# Patient Record
Sex: Female | Born: 1980
Health system: Southern US, Community
[De-identification: ages and names within clinical notes are randomized; demographics above are authoritative.]

## PROBLEM LIST (undated history)

## (undated) ENCOUNTER — Inpatient Hospital Stay (HOSPITAL_COMMUNITY): Payer: Self-pay

## (undated) DIAGNOSIS — Z862 Personal history of diseases of the blood and blood-forming organs and certain disorders involving the immune mechanism: Secondary | ICD-10-CM

## (undated) DIAGNOSIS — L309 Dermatitis, unspecified: Secondary | ICD-10-CM

## (undated) DIAGNOSIS — E279 Disorder of adrenal gland, unspecified: Secondary | ICD-10-CM

## (undated) DIAGNOSIS — E269 Hyperaldosteronism, unspecified: Secondary | ICD-10-CM

## (undated) DIAGNOSIS — E876 Hypokalemia: Secondary | ICD-10-CM

## (undated) DIAGNOSIS — Z8619 Personal history of other infectious and parasitic diseases: Secondary | ICD-10-CM

## (undated) DIAGNOSIS — G43909 Migraine, unspecified, not intractable, without status migrainosus: Secondary | ICD-10-CM

## (undated) DIAGNOSIS — B9689 Other specified bacterial agents as the cause of diseases classified elsewhere: Secondary | ICD-10-CM

## (undated) DIAGNOSIS — N870 Mild cervical dysplasia: Secondary | ICD-10-CM

## (undated) DIAGNOSIS — D573 Sickle-cell trait: Secondary | ICD-10-CM

## (undated) DIAGNOSIS — M419 Scoliosis, unspecified: Secondary | ICD-10-CM

## (undated) DIAGNOSIS — O165 Unspecified maternal hypertension, complicating the puerperium: Secondary | ICD-10-CM

## (undated) DIAGNOSIS — B379 Candidiasis, unspecified: Secondary | ICD-10-CM

## (undated) DIAGNOSIS — B999 Unspecified infectious disease: Secondary | ICD-10-CM

## (undated) DIAGNOSIS — N760 Acute vaginitis: Secondary | ICD-10-CM

## (undated) DIAGNOSIS — I10 Essential (primary) hypertension: Secondary | ICD-10-CM

## (undated) DIAGNOSIS — O139 Gestational [pregnancy-induced] hypertension without significant proteinuria, unspecified trimester: Secondary | ICD-10-CM

## (undated) DIAGNOSIS — B373 Candidiasis of vulva and vagina: Secondary | ICD-10-CM

## (undated) DIAGNOSIS — D563 Thalassemia minor: Secondary | ICD-10-CM

## (undated) DIAGNOSIS — Z87440 Personal history of urinary (tract) infections: Secondary | ICD-10-CM

## (undated) DIAGNOSIS — E2609 Other primary hyperaldosteronism: Secondary | ICD-10-CM

## (undated) DIAGNOSIS — IMO0002 Reserved for concepts with insufficient information to code with codable children: Secondary | ICD-10-CM

## (undated) HISTORY — DX: Other specified bacterial agents as the cause of diseases classified elsewhere: B96.89

## (undated) HISTORY — DX: Personal history of other infectious and parasitic diseases: Z86.19

## (undated) HISTORY — DX: Personal history of urinary (tract) infections: Z87.440

## (undated) HISTORY — DX: Unspecified infectious disease: B99.9

## (undated) HISTORY — DX: Hyperaldosteronism, unspecified: E26.9

## (undated) HISTORY — DX: Reserved for concepts with insufficient information to code with codable children: IMO0002

## (undated) HISTORY — DX: Candidiasis, unspecified: B37.9

## (undated) HISTORY — DX: Gestational (pregnancy-induced) hypertension without significant proteinuria, unspecified trimester: O13.9

## (undated) HISTORY — DX: Unspecified maternal hypertension, complicating the puerperium: O16.5

## (undated) HISTORY — DX: Dermatitis, unspecified: L30.9

## (undated) HISTORY — PX: WISDOM TOOTH EXTRACTION: SHX21

## (undated) HISTORY — PX: TUBAL LIGATION: SHX77

## (undated) HISTORY — DX: Candidiasis of vulva and vagina: B37.3

## (undated) HISTORY — DX: Mild cervical dysplasia: N87.0

## (undated) HISTORY — DX: Migraine, unspecified, not intractable, without status migrainosus: G43.909

## (undated) HISTORY — DX: Sickle-cell trait: D57.3

## (undated) HISTORY — DX: Acute vaginitis: N76.0

## (undated) HISTORY — DX: Personal history of diseases of the blood and blood-forming organs and certain disorders involving the immune mechanism: Z86.2

---
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
## 2000-07-23 ENCOUNTER — Inpatient Hospital Stay (HOSPITAL_COMMUNITY): Admission: AD | Admit: 2000-07-23 | Discharge: 2000-07-23 | Payer: Self-pay | Admitting: *Deleted

## 2000-09-03 DIAGNOSIS — E279 Disorder of adrenal gland, unspecified: Secondary | ICD-10-CM

## 2000-09-03 HISTORY — DX: Disorder of adrenal gland, unspecified: E27.9

## 2001-08-30 ENCOUNTER — Encounter: Payer: Self-pay | Admitting: *Deleted

## 2001-08-30 ENCOUNTER — Inpatient Hospital Stay (HOSPITAL_COMMUNITY): Admission: AD | Admit: 2001-08-30 | Discharge: 2001-08-30 | Payer: Self-pay | Admitting: *Deleted

## 2001-09-01 ENCOUNTER — Inpatient Hospital Stay (HOSPITAL_COMMUNITY): Admission: AD | Admit: 2001-09-01 | Discharge: 2001-09-01 | Payer: Self-pay | Admitting: Obstetrics

## 2001-09-01 ENCOUNTER — Encounter: Payer: Self-pay | Admitting: Obstetrics and Gynecology

## 2001-09-01 ENCOUNTER — Encounter (INDEPENDENT_AMBULATORY_CARE_PROVIDER_SITE_OTHER): Payer: Self-pay

## 2001-09-03 HISTORY — PX: DILATION AND CURETTAGE OF UTERUS: SHX78

## 2002-08-02 ENCOUNTER — Emergency Department (HOSPITAL_COMMUNITY): Admission: EM | Admit: 2002-08-02 | Discharge: 2002-08-02 | Payer: Self-pay | Admitting: Emergency Medicine

## 2002-09-26 ENCOUNTER — Encounter: Payer: Self-pay | Admitting: Orthopedic Surgery

## 2002-09-26 ENCOUNTER — Ambulatory Visit (HOSPITAL_COMMUNITY): Admission: RE | Admit: 2002-09-26 | Discharge: 2002-09-26 | Payer: Self-pay | Admitting: Family Medicine

## 2003-08-03 ENCOUNTER — Observation Stay (HOSPITAL_COMMUNITY): Admission: AD | Admit: 2003-08-03 | Discharge: 2003-08-04 | Payer: Self-pay | Admitting: *Deleted

## 2003-08-19 ENCOUNTER — Other Ambulatory Visit: Admission: RE | Admit: 2003-08-19 | Discharge: 2003-08-19 | Payer: Self-pay | Admitting: Obstetrics and Gynecology

## 2003-10-14 ENCOUNTER — Ambulatory Visit (HOSPITAL_COMMUNITY): Admission: RE | Admit: 2003-10-14 | Discharge: 2003-10-14 | Payer: Self-pay | Admitting: Obstetrics and Gynecology

## 2003-11-24 ENCOUNTER — Ambulatory Visit (HOSPITAL_COMMUNITY): Admission: RE | Admit: 2003-11-24 | Discharge: 2003-11-24 | Payer: Self-pay | Admitting: Obstetrics and Gynecology

## 2003-12-07 ENCOUNTER — Ambulatory Visit (HOSPITAL_COMMUNITY): Admission: RE | Admit: 2003-12-07 | Discharge: 2003-12-07 | Payer: Self-pay | Admitting: Obstetrics and Gynecology

## 2003-12-23 ENCOUNTER — Inpatient Hospital Stay (HOSPITAL_COMMUNITY): Admission: AD | Admit: 2003-12-23 | Discharge: 2003-12-23 | Payer: Self-pay | Admitting: Obstetrics and Gynecology

## 2004-01-20 ENCOUNTER — Ambulatory Visit (HOSPITAL_COMMUNITY): Admission: RE | Admit: 2004-01-20 | Discharge: 2004-01-20 | Payer: Self-pay | Admitting: Obstetrics and Gynecology

## 2004-02-15 ENCOUNTER — Ambulatory Visit (HOSPITAL_COMMUNITY): Admission: RE | Admit: 2004-02-15 | Discharge: 2004-02-15 | Payer: Self-pay | Admitting: Obstetrics and Gynecology

## 2004-02-23 ENCOUNTER — Ambulatory Visit (HOSPITAL_COMMUNITY): Admission: RE | Admit: 2004-02-23 | Discharge: 2004-02-23 | Payer: Self-pay | Admitting: Obstetrics and Gynecology

## 2004-03-01 ENCOUNTER — Encounter (INDEPENDENT_AMBULATORY_CARE_PROVIDER_SITE_OTHER): Payer: Self-pay | Admitting: Specialist

## 2004-03-01 ENCOUNTER — Inpatient Hospital Stay (HOSPITAL_COMMUNITY): Admission: RE | Admit: 2004-03-01 | Discharge: 2004-03-04 | Payer: Self-pay | Admitting: Obstetrics and Gynecology

## 2004-04-20 ENCOUNTER — Emergency Department (HOSPITAL_COMMUNITY): Admission: EM | Admit: 2004-04-20 | Discharge: 2004-04-20 | Payer: Self-pay | Admitting: Emergency Medicine

## 2004-07-14 ENCOUNTER — Other Ambulatory Visit: Admission: RE | Admit: 2004-07-14 | Discharge: 2004-07-14 | Payer: Self-pay | Admitting: Obstetrics and Gynecology

## 2004-08-07 ENCOUNTER — Ambulatory Visit (HOSPITAL_COMMUNITY): Admission: RE | Admit: 2004-08-07 | Discharge: 2004-08-07 | Payer: Self-pay | Admitting: Specialist

## 2004-08-07 ENCOUNTER — Encounter (INDEPENDENT_AMBULATORY_CARE_PROVIDER_SITE_OTHER): Payer: Self-pay | Admitting: Specialist

## 2004-08-07 ENCOUNTER — Ambulatory Visit (HOSPITAL_BASED_OUTPATIENT_CLINIC_OR_DEPARTMENT_OTHER): Admission: RE | Admit: 2004-08-07 | Discharge: 2004-08-07 | Payer: Self-pay | Admitting: Specialist

## 2004-09-03 HISTORY — PX: BREAST REDUCTION SURGERY: SHX8

## 2005-08-14 ENCOUNTER — Other Ambulatory Visit: Admission: RE | Admit: 2005-08-14 | Discharge: 2005-08-14 | Payer: Self-pay | Admitting: Obstetrics and Gynecology

## 2005-08-15 ENCOUNTER — Other Ambulatory Visit: Admission: RE | Admit: 2005-08-15 | Discharge: 2005-08-15 | Payer: Self-pay | Admitting: Obstetrics and Gynecology

## 2005-09-03 DIAGNOSIS — E876 Hypokalemia: Secondary | ICD-10-CM

## 2005-09-03 DIAGNOSIS — L309 Dermatitis, unspecified: Secondary | ICD-10-CM

## 2005-09-03 HISTORY — DX: Dermatitis, unspecified: L30.9

## 2005-09-03 HISTORY — DX: Hypokalemia: E87.6

## 2005-10-13 ENCOUNTER — Emergency Department (HOSPITAL_COMMUNITY): Admission: EM | Admit: 2005-10-13 | Discharge: 2005-10-13 | Payer: Self-pay | Admitting: Family Medicine

## 2005-12-01 IMAGING — US US OB FOLLOW-UP
1 series · 13 of 28 positions shown · non-contrast
Comparison: none

CLINICAL DATA: G1 P0.  24 weeks 2 days with twins.  Assess growth.

[Series 1: unknown · 0.33mm/px · 13 of 50 slices shown]
[im 2/50]
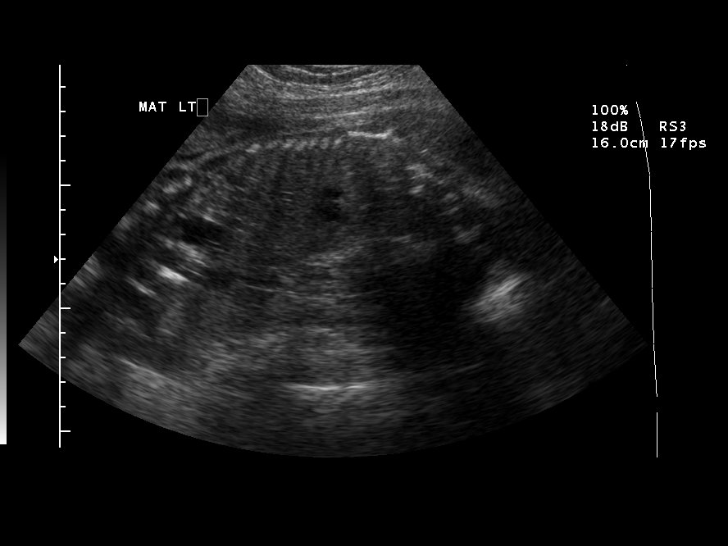
[im 6/50]
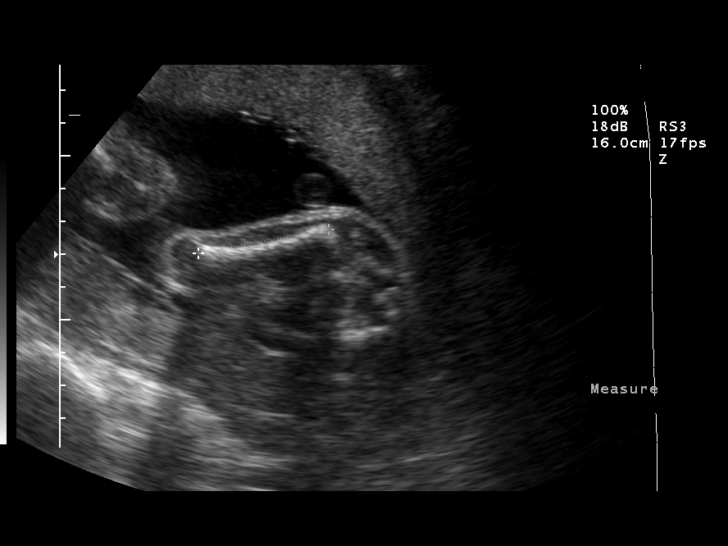
[im 10/50]
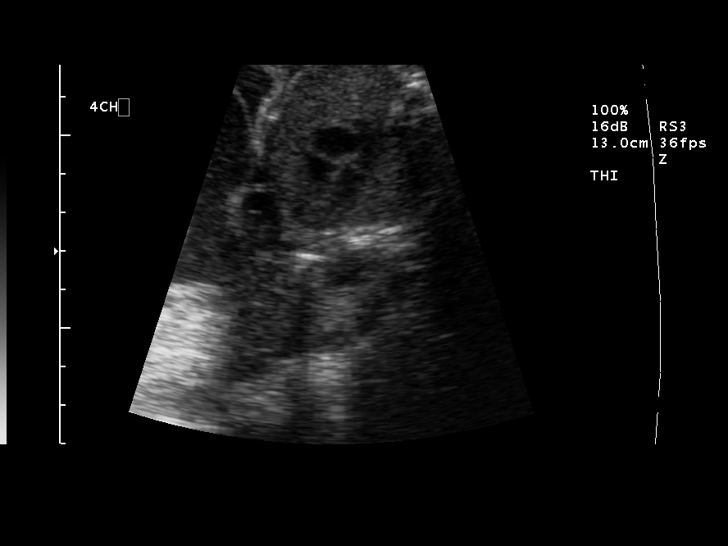
[im 13/50]
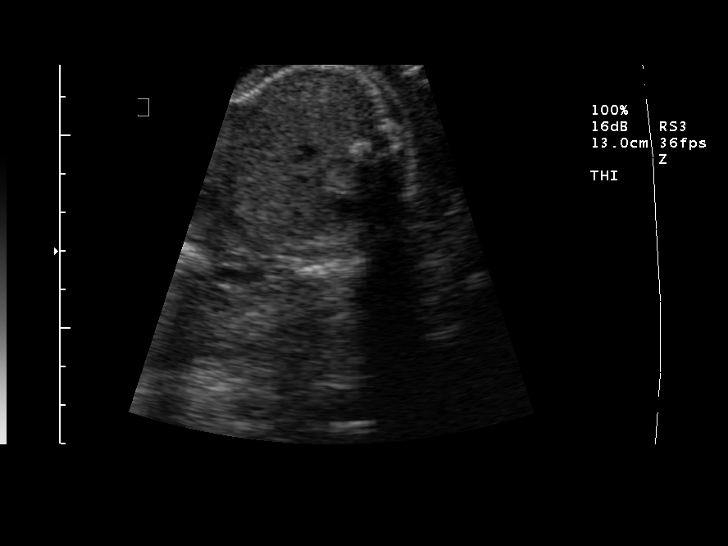
[im 17/50]
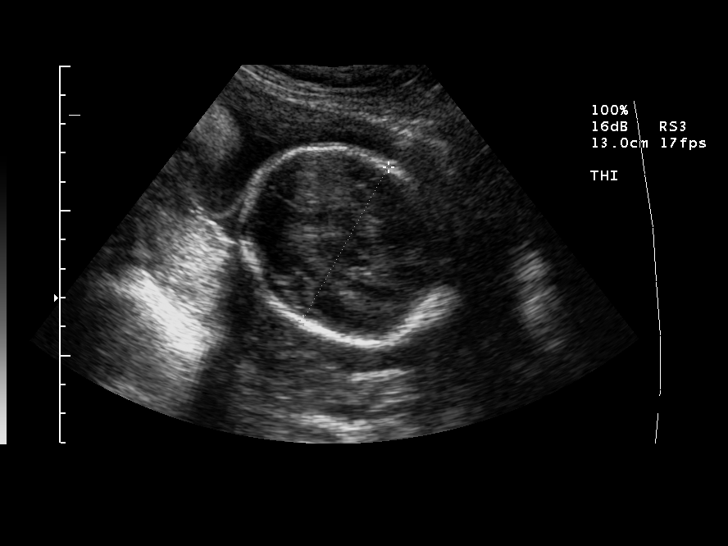
[im 20/50]
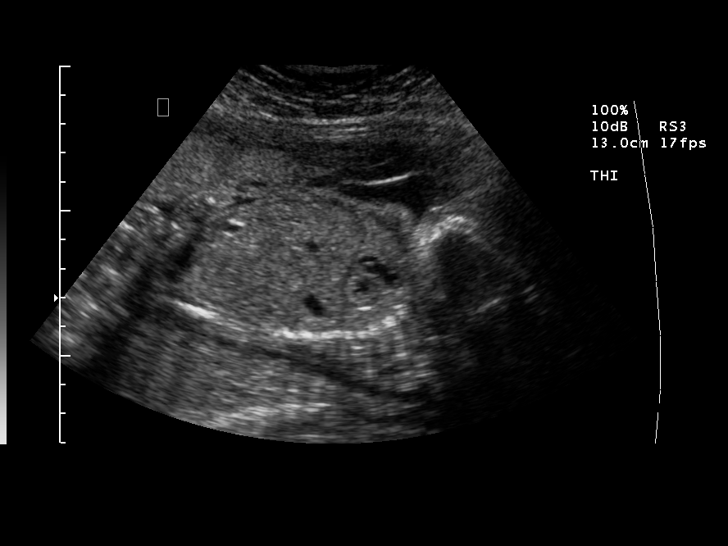
[im 26/50]
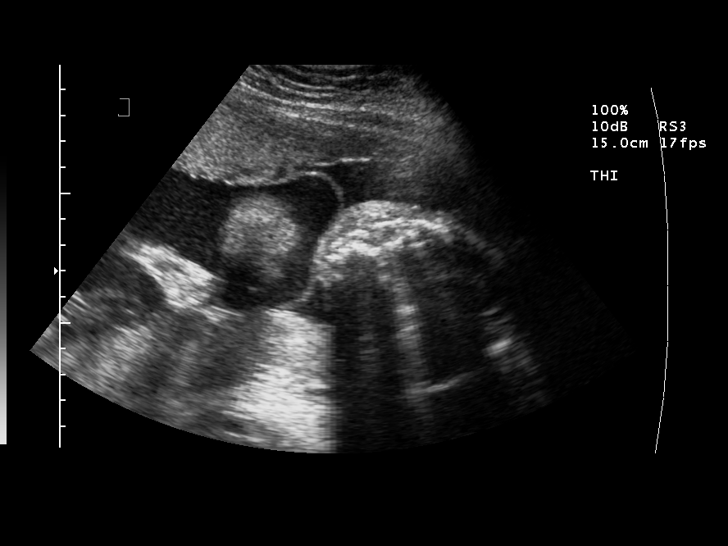
[im 30/50]
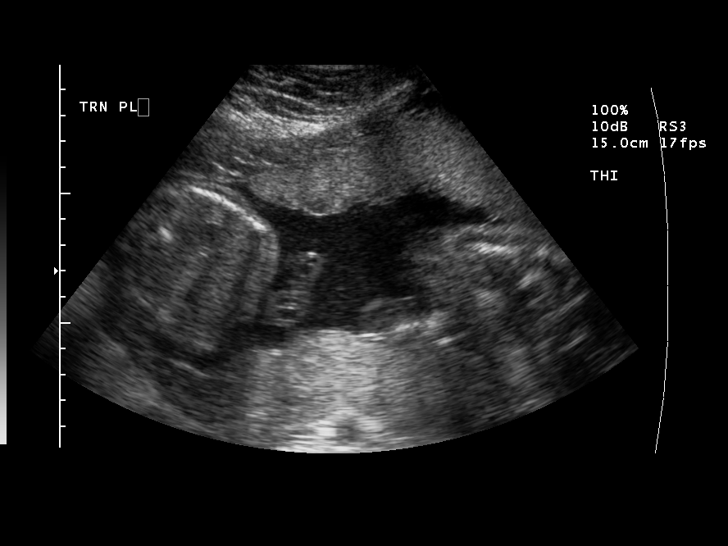
[im 33/50]
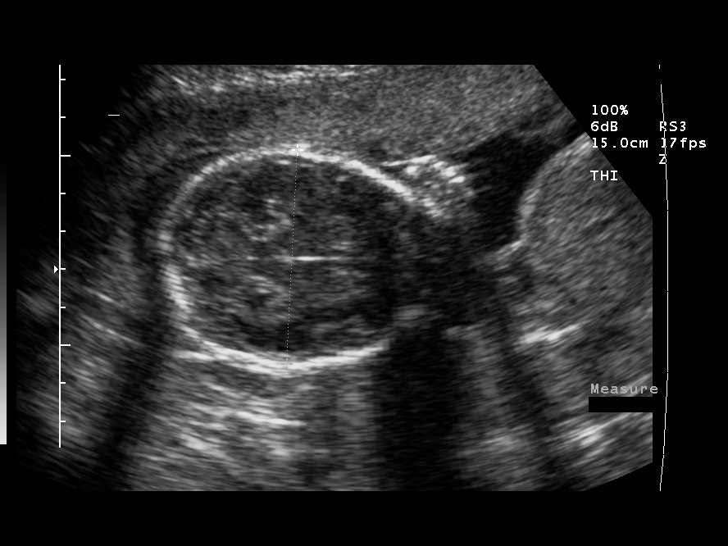
[im 37/50]
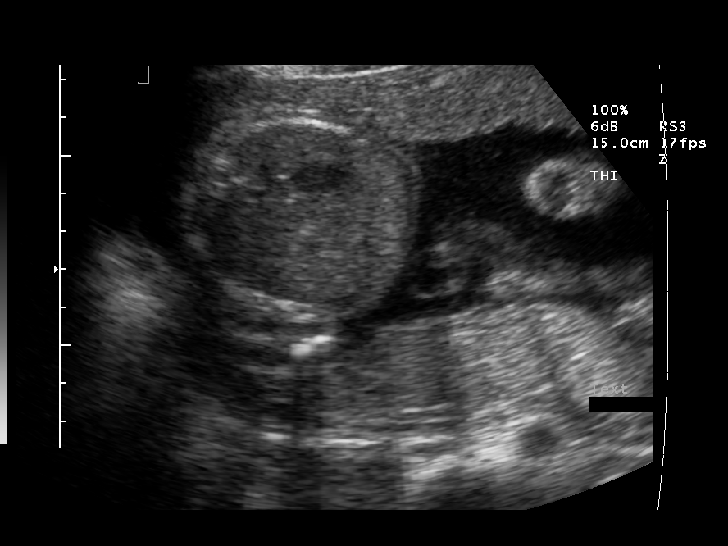
[im 40/50]
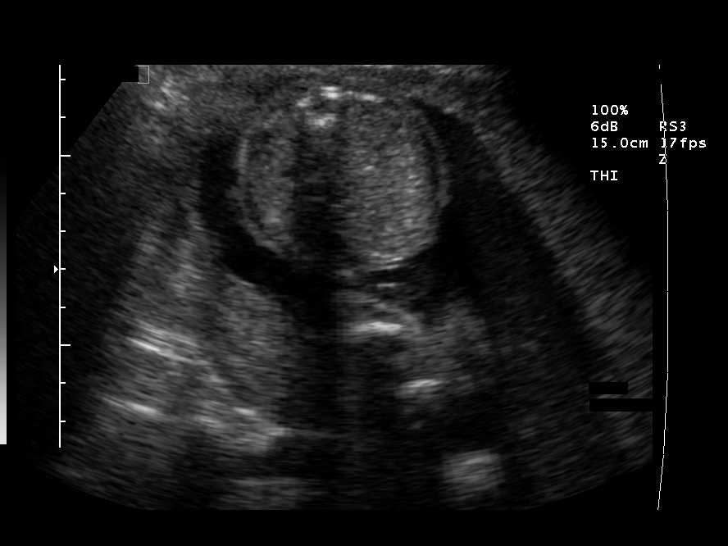
[im 44/50]
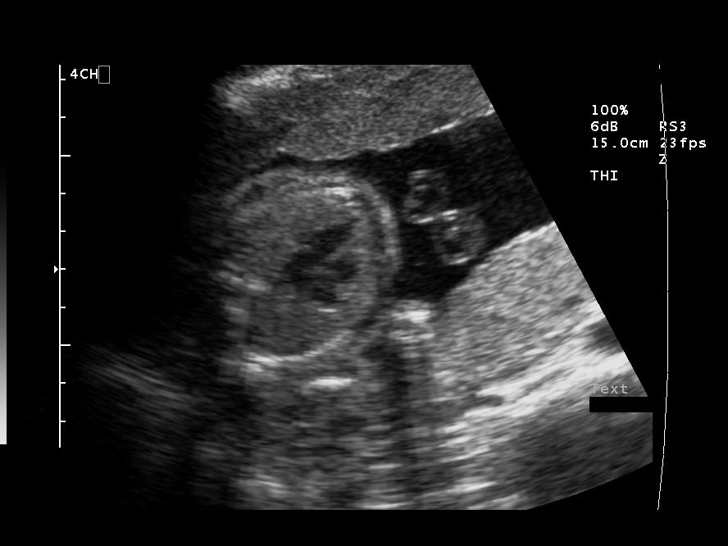
[im 48/50]
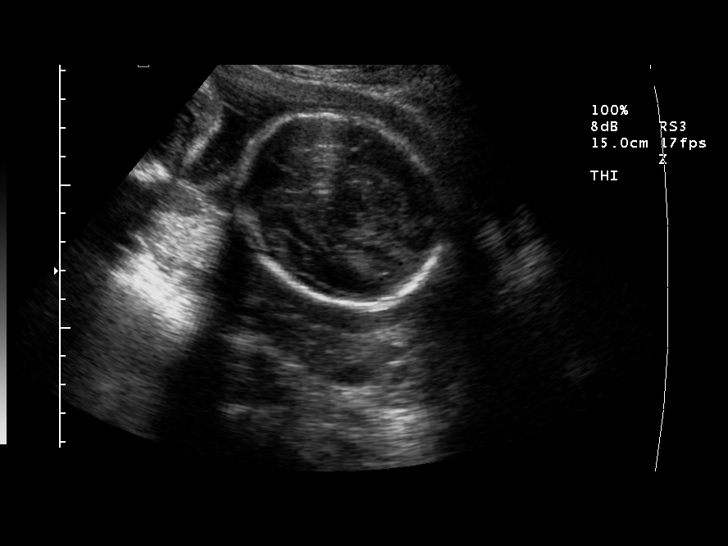

[13 of 28 positions shown; findings below may reference images not displayed]

TWIN OBSTETRICAL ULTRASOUND REEVALUATION:
A living dichorionic/diamniotic twin pregnancy.  A separating membrane is seen.

TWIN A:
Heart Rate:  142
Movement:  Yes
Breathing:  No
Presentation:  Cephalic 
Placental Location:  Posterior
Grade:  I
Previa:  No
Amniotic Fluid (Subjective):  Low normal
Amniotic Fluid (Objective):   2.6 cm Vertical pocket 

FETAL BIOMETRY (TWIN A)
BPD:  6.1 cm   24 w 5 d
HC:  22.3 cm   24 w 2 d
AC:  18.1 cm   23 w 0 d
FL:  4.1 cm   23 w 2 d

Mean GA:  23 w 6 d
LMP GA:                     24 w 2 d (assigned)

FETAL ANATOMY (TWIN A)
Lateral Ventricles:  Visualized 
Thalami/CSP:  Previously seen 
Posterior Fossa:  Previously seen 
Nuchal Region:  Previously seen 
Spine:  Previously seen 
4 Chamber Heart on L:  Visualized 
Stomach on Left:  Visualized 
3 Vessel Cord:  Previously seen 
Cord Insertion Site:  Previously seen 
Kidneys:  Visualized 
Bladder:  Previously seen 
Extremities:  Previously seen 

TWIN B:
Heart Rate:  153
Movement:  Yes
Breathing:  No
Presentation:  Cephalic
Placental Location:  Anterior
Grade:  I
Previa:  No
Amniotic Fluid (Subjective):  Normal  
Amniotic Fluid (Objective):   5.8 cm Vertical pocket 

FETAL BIOMETRY (TWIN B)
BPD:  5.6 cm  23 w 1 d
HC:  20.9 cm   23 w 0 d
AC:  19.0 cm   23 w 5 d
FL:  4.1 cm  23 w 2 d

Mean GA:  23 w 2 d
LMP GA:                       24 w 2 d (assigned)

FETAL ANATOMY (TWIN B)
Lateral Ventricles:  Visualized 
Thalami/CSP:  Previously seen 
Posterior Fossa:  Previously seen 
Nuchal Region:  Previously seen  
Spine:  Previously seen 
4 Chamber Heart on L:  Visualized 
Stomach on Left:  Visualized 
3 Vessel Cord:  Previously seen 
Cord Insertion Site:  Previously seen 
Kidneys:  Visualized 
Bladder:  Visualized 
Extremities:  Previously seen 

MATERNAL FINDINGS
Cervix:  4.1 cm Translabially
IMPRESSION: Living dichorionic/diamniotic twin pregnancy of 24 weeks 2 days gestational age.  Twin A measures 23 weeks 6 days and twin B measures 23 weeks 2 days.  Growth is currently appropriate and concordant.  
There is normal fluid in each gestational sac, but less fluid in the sac surrounding twin A than the sac surrounding twin B.

## 2005-12-22 ENCOUNTER — Inpatient Hospital Stay (HOSPITAL_COMMUNITY): Admission: AD | Admit: 2005-12-22 | Discharge: 2005-12-22 | Payer: Self-pay | Admitting: Obstetrics and Gynecology

## 2005-12-28 ENCOUNTER — Encounter (INDEPENDENT_AMBULATORY_CARE_PROVIDER_SITE_OTHER): Payer: Self-pay | Admitting: Specialist

## 2005-12-28 ENCOUNTER — Ambulatory Visit (HOSPITAL_COMMUNITY): Admission: RE | Admit: 2005-12-28 | Discharge: 2005-12-28 | Payer: Self-pay | Admitting: Obstetrics and Gynecology

## 2005-12-30 IMAGING — US US OB FOLLOW-UP EACH ADDL GEST (MODIFY)
1 series · 13 of 28 positions shown · non-contrast
Comparison: none

CLINICAL DATA: Twin pregnancy; assigned gestational age is 28 weeks 3 days;  question leaking fluid.  
TWIN OBSTETRICAL ULTRASOUND REEVALUATION:
A living twin gestation is present.  A separating membrane is seen.

[Series 1: unknown · 0.32mm/px · 13 of 64 slices shown]
[im 3/64]
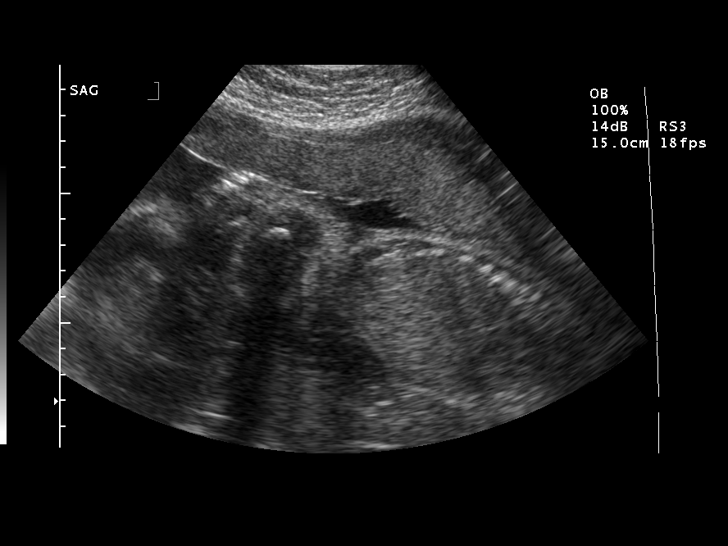
[im 8/64]
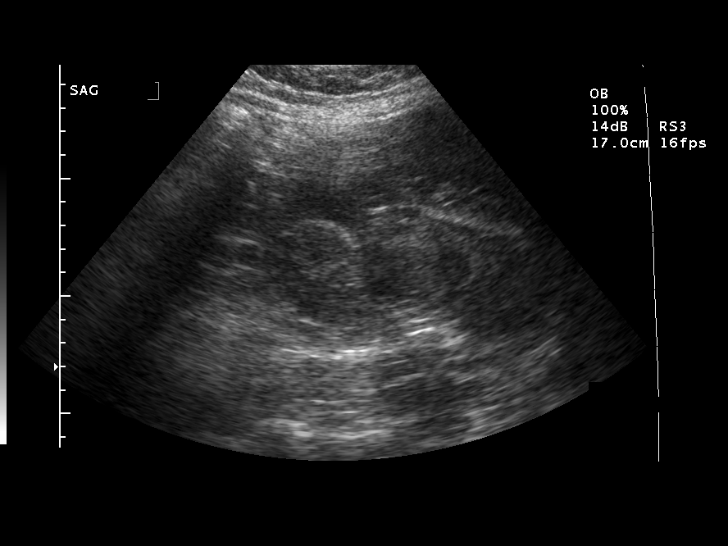
[im 12/64]
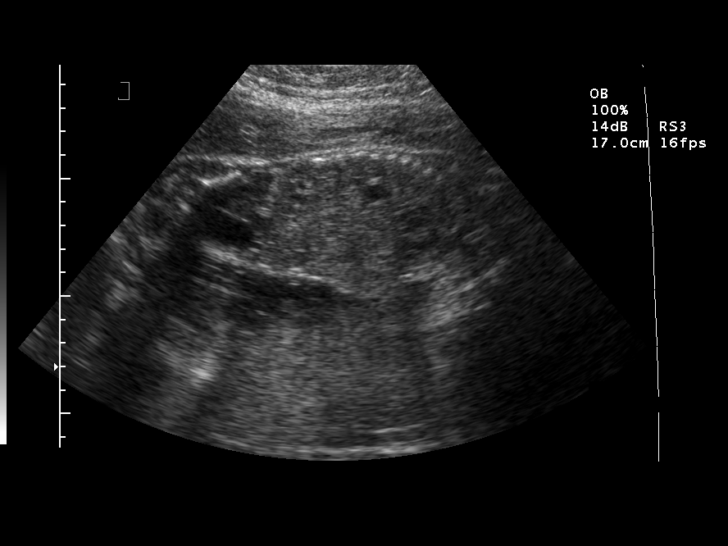
[im 17/64]
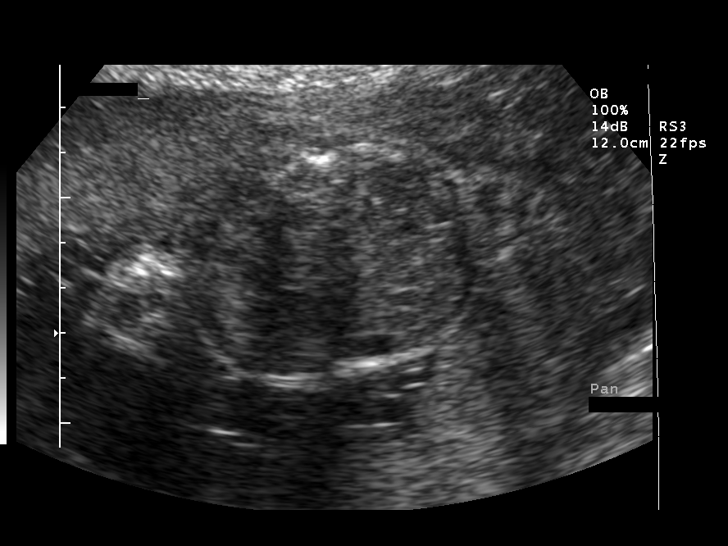
[im 22/64]
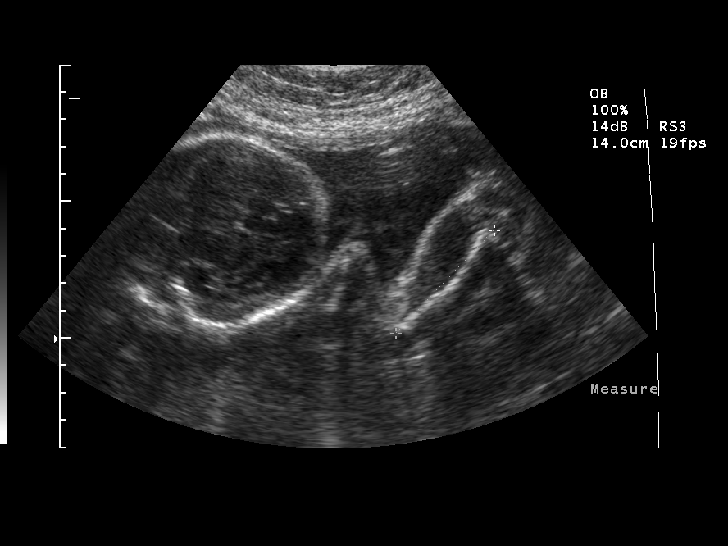
[im 26/64]
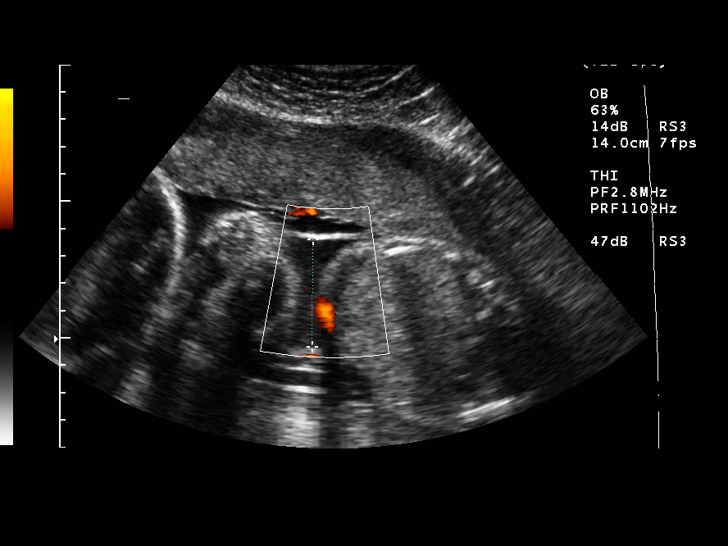
[im 33/64]
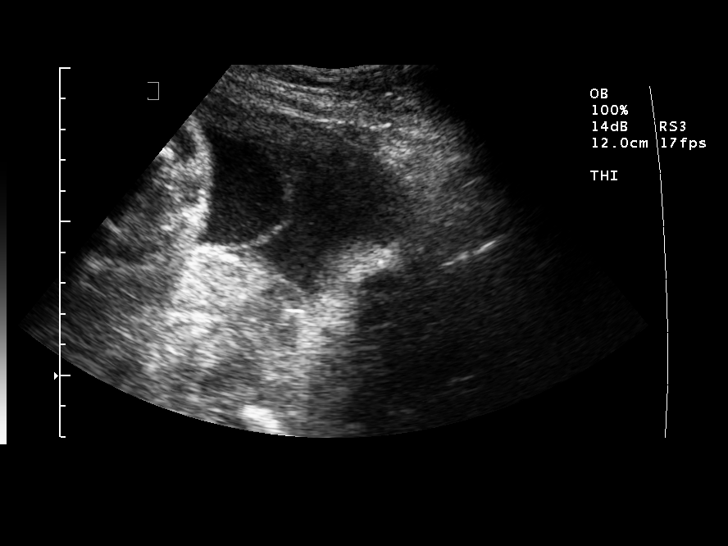
[im 38/64]
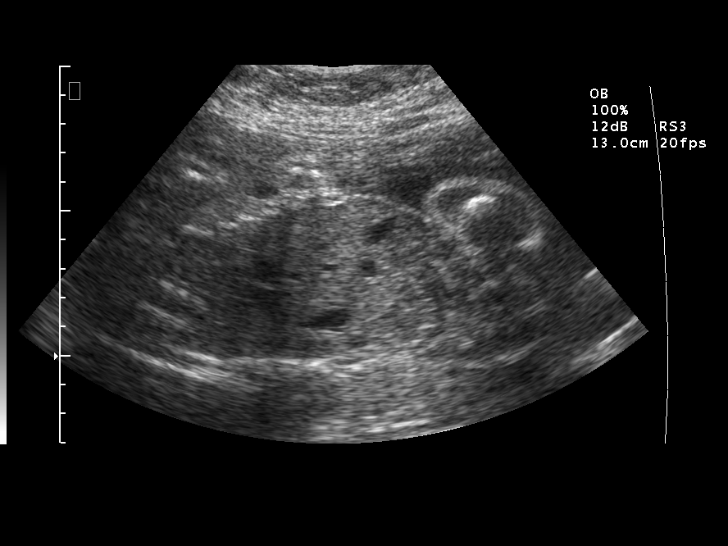
[im 43/64]
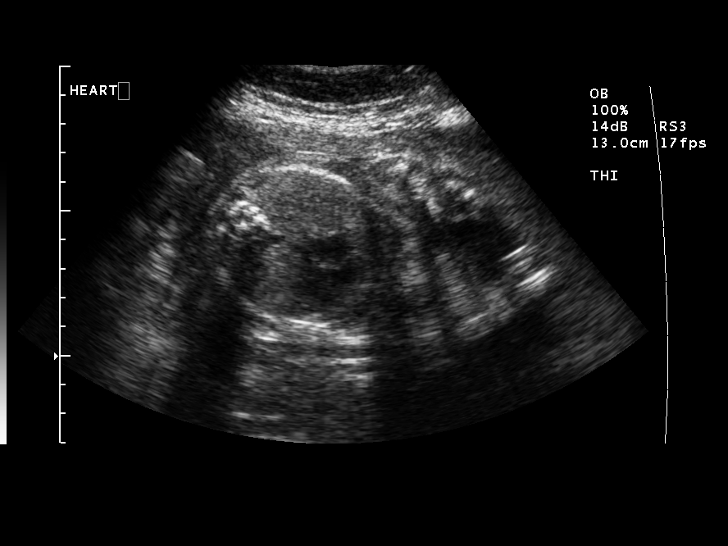
[im 47/64]
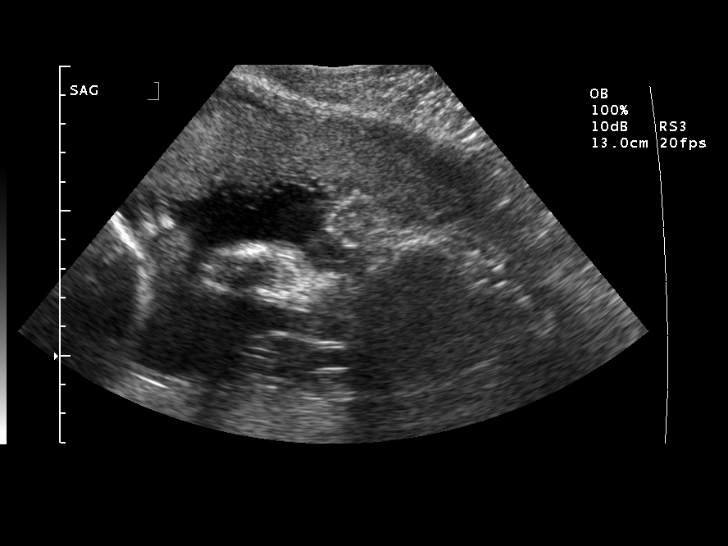
[im 52/64]
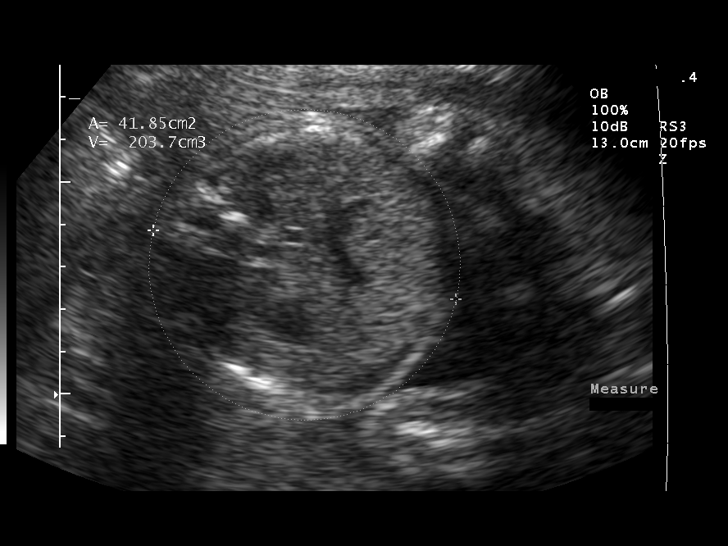
[im 57/64]
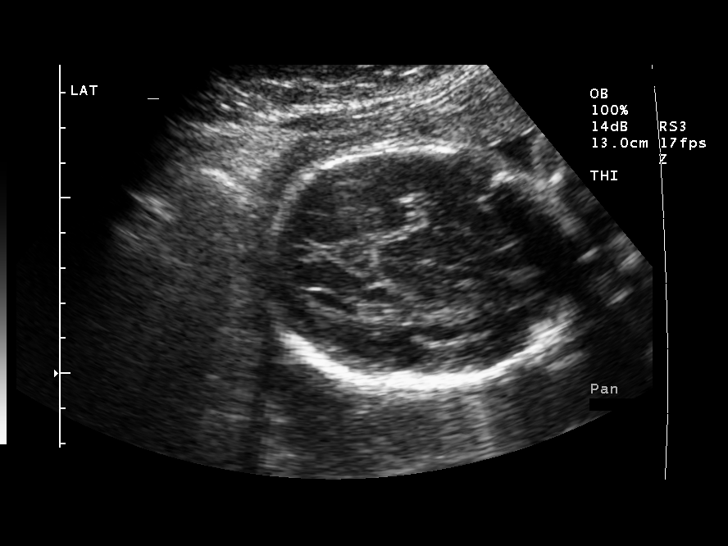
[im 61/64]
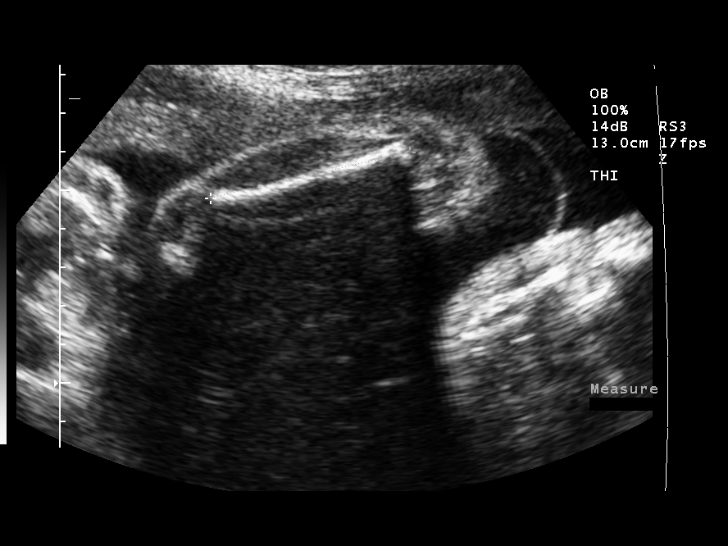

[13 of 28 positions shown; findings below may reference images not displayed]

TWIN A:
Heart Rate:  147
Movement:  Yes
Breathing:  No
Presentation:  Cephalic on the maternal left
Placental Location:  Posterior
Grade:  I
Previa:  No
Amniotic Fluid (Subjective):  Normal
Amniotic Fluid (Objective):   3.9 cm Vertical pocket 

FETAL BIOMETRY (TWIN A)
BPD:  7.2 cm   28 w 6 d
HC:  26.3 cm   28 w 4 d
AC:  22.2 cm   26 w 4 d
FL:  5.2 cm   27 w 5 d

Mean GA:  28 w 0 d

EFW:  7057 g (H) 50th ? 75th%ile (3449 ? 2285 g) For 28 wks

FETAL ANATOMY (TWIN A)
Lateral Ventricles:  Visualized 
Thalami/CSP:  Visualized 
Posterior Fossa:  Previously seen 
Nuchal Region:  Previously seen 
Spine:  Previously seen 
4 Chamber Heart on L:  Previously seen 
Stomach on Left:  Visualized 
3 Vessel Cord:  Previously seen 
Cord Insertion Site:  Previously seen 
Kidneys:  Visualized 
Bladder:  Visualized 
Extremities:  Previously seen 

Evaluation limited by:  Advanced gestational age 

TWIN B:
Heart Rate:  155
Movement:  Yes
Breathing:  No
Presentation:  Breech on the maternal right
Placental Location:  Anterior
Grade:  I
Previa:  No
Amniotic Fluid (Subjective):  Normal
Amniotic Fluid (Objective):   5.6 cm Vertical pocket 

FETAL BIOMETRY (TWIN B)
BPD:  6.8 cm   27 w 4 d
HC:  25.3 cm   27 w 4 d
AC:  23.0 cm   27 w 3 d
FL:  5.3 cm   28 w 1 d

Mean GA:  27 w 5 d

EFW:  1128 g (H) 50th ? 75th%ile (3449 ? 2285 g) For 28 wks

FETAL ANATOMY (TWIN B)
Lateral Ventricles:  Visualized 
Thalami/CSP:  Visualized 
Posterior Fossa:  Previously seen 
Nuchal Region:  N/A
Spine:  Previously seen 
4 Chamber Heart on L:  Previously seen 
Stomach on Left:  Visualized 
3 Vessel Cord:  Previously seen 
Cord Insertion Site:  Previously seen 
Kidneys:  Visualized 
Bladder:  Visualized 
Extremities:  Previously seen 

Evaluation limited By:  Advanced gestational age 

MATERNAL FINDINGS
Cervix:  4.0 cm Translabially
IMPRESSION: There is a living twin gestation.  Both baby A and baby B remain at the 50th ? 75th percentile for a 28 week gestation.  The fluid within baby A's sac is lower than within baby B's sac; however, both are subjectively within normal limits.  
The cervix remains long and closed measuring 4.0 cm, translabially

## 2006-01-27 IMAGING — US US OB FOLLOW-UP EACH ADDL GEST (MODIFY)
1 series · 12 of 28 positions shown · non-contrast
Comparison: none

CLINICAL DATA: Size greater than dates.

[Series 1: unknown · 0.30mm/px · 12 of 55 slices shown]
[im 3/55]
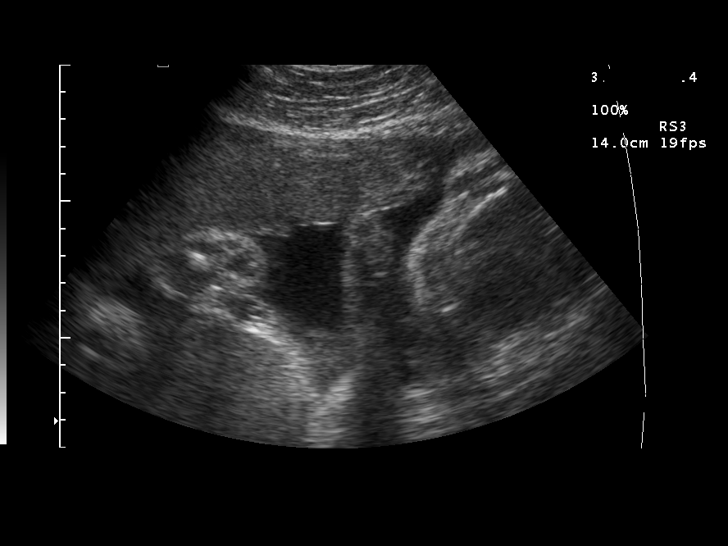
[im 7/55]
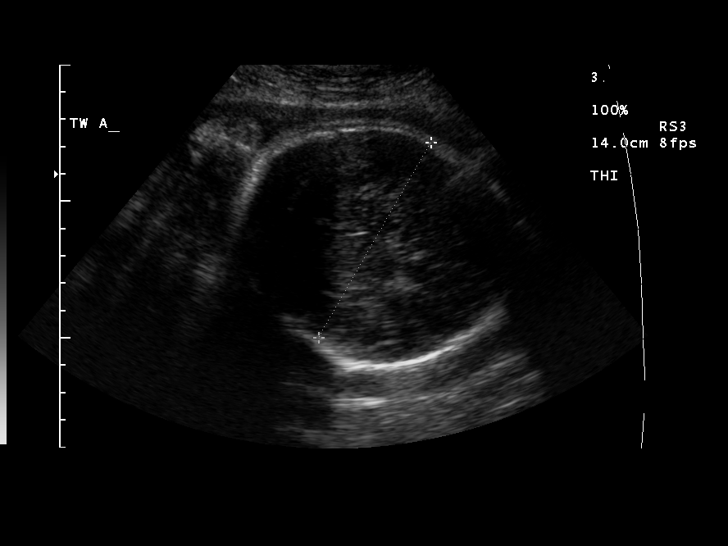
[im 11/55]
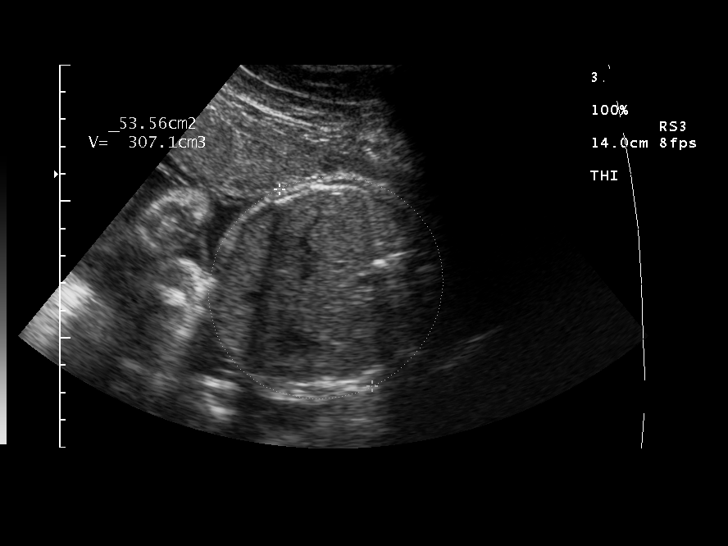
[im 17/55]
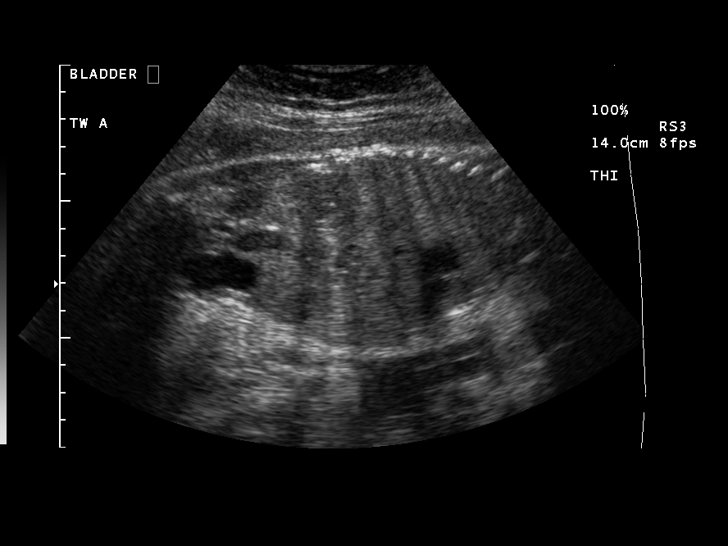
[im 21/55]
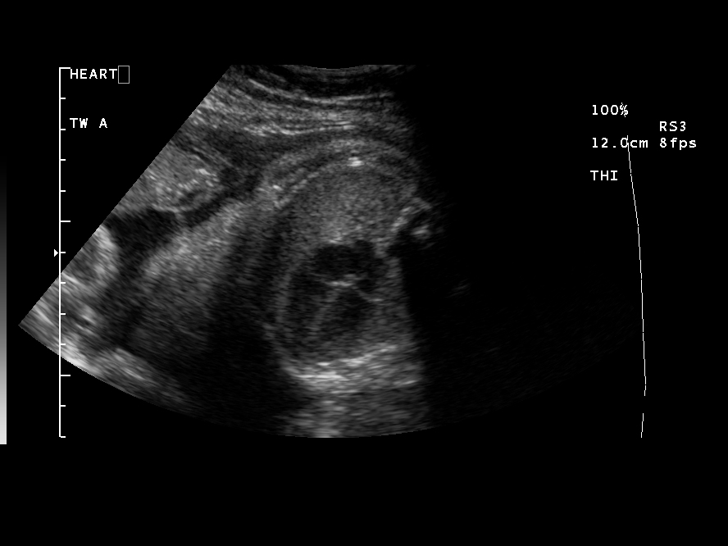
[im 25/55]
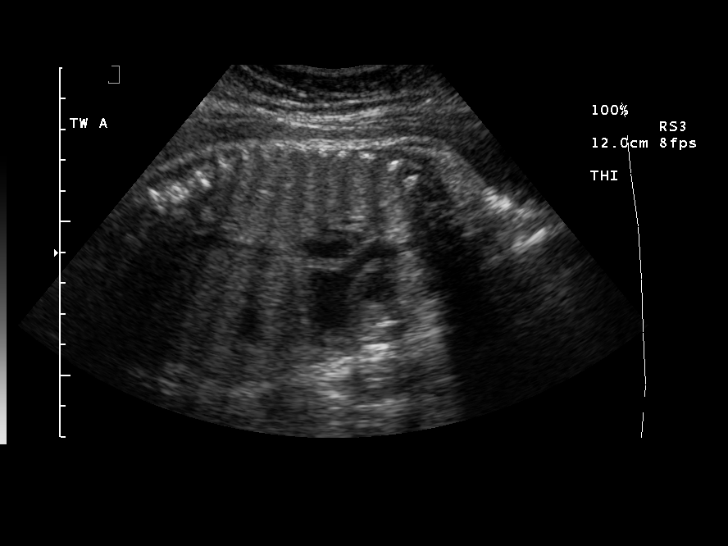
[im 31/55]
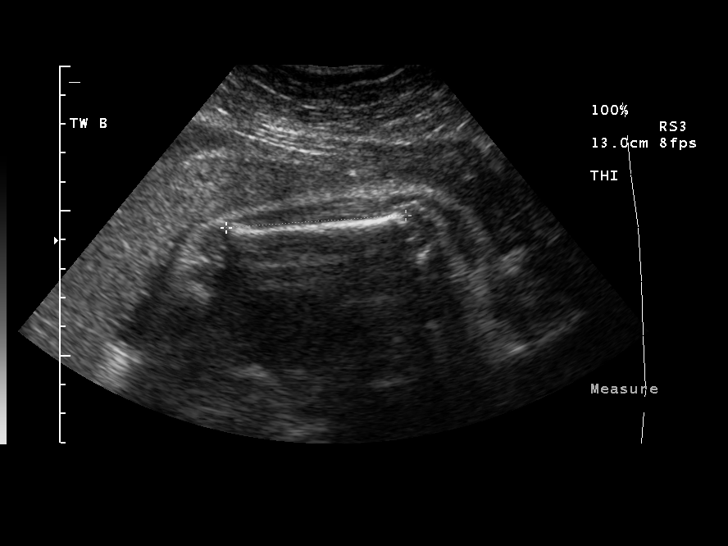
[im 35/55]
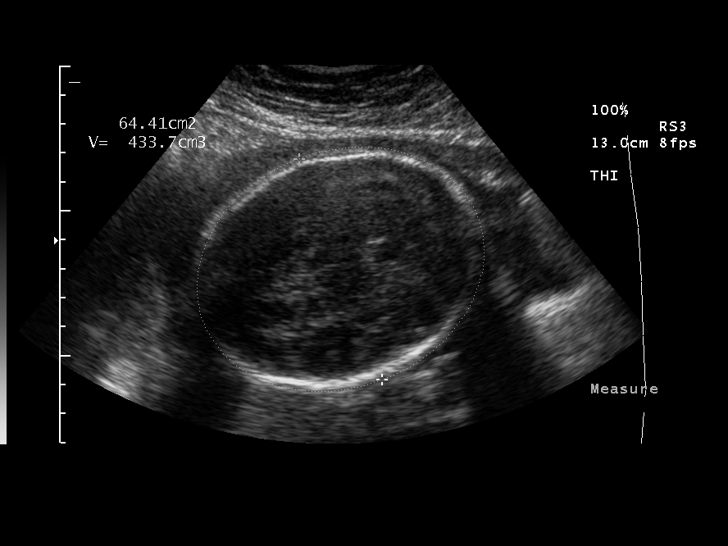
[im 39/55]
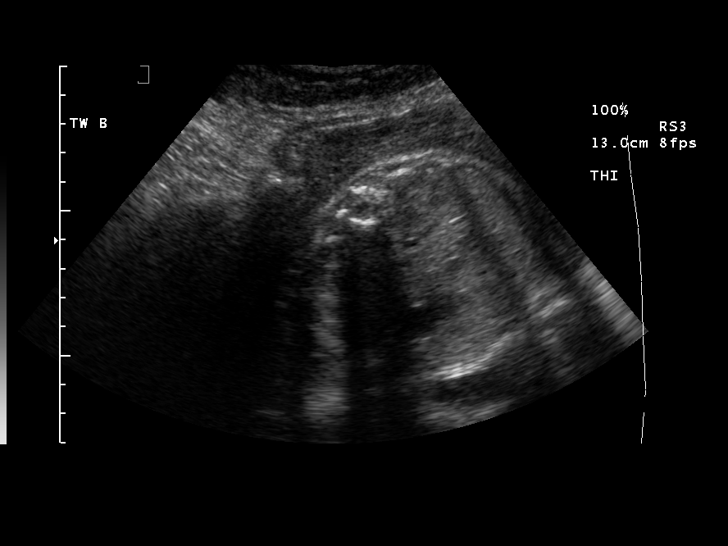
[im 45/55]
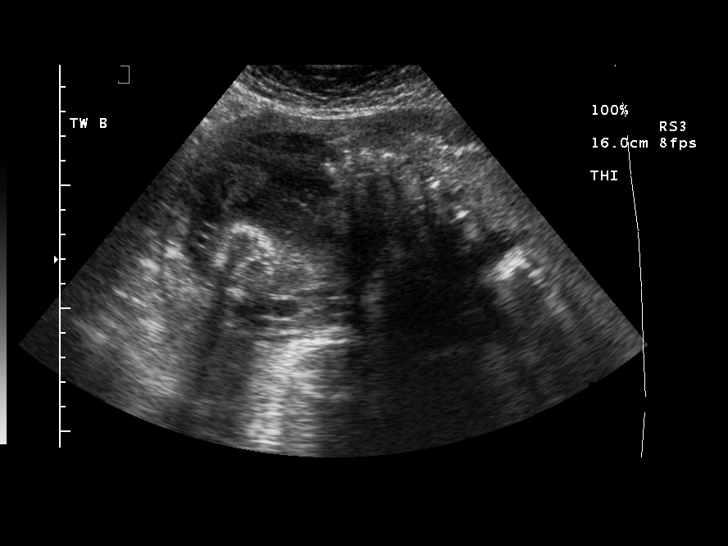
[im 49/55]
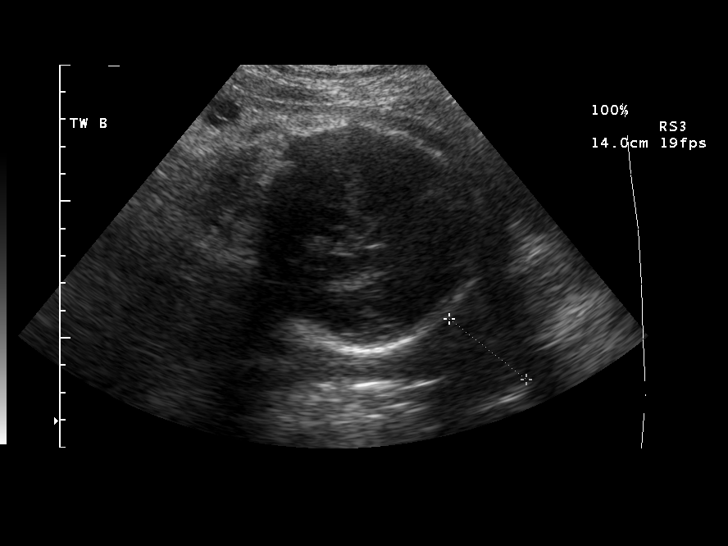
[im 53/55]
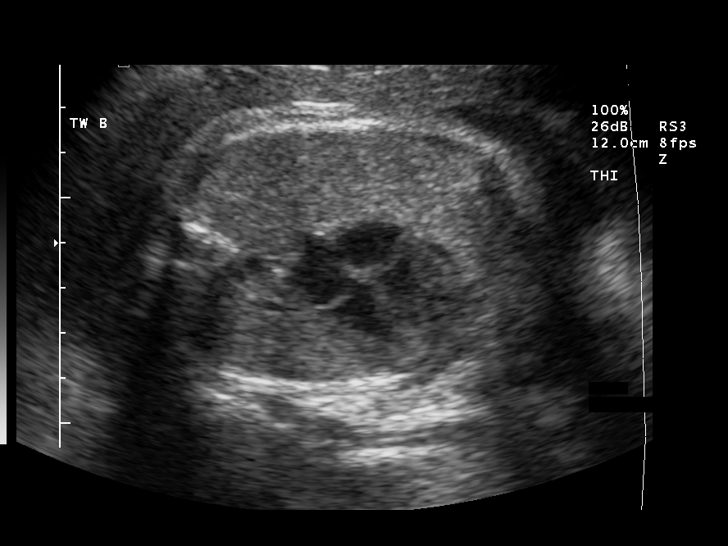

[12 of 28 positions shown; findings below may reference images not displayed]

TWIN OBSTETRICAL ULTRASOUND REEVALUATION:
A living twin gestation is present.  A separating membrane is seen.

TWIN A:
Heart Rate:  145
Movement:  Yes
Breathing:  Yes
Presentation:  Cephalic on the left
Placental Location:  Posterior
Grade:  II
Previa:  No
Amniotic Fluid (Subjective):  Normal
Amniotic Fluid (Objective):   5.1 cm Vertical pocket 

FETAL BIOMETRY (TWIN A)
BPD:  8.2 cm   33 w 0 d
HC:  29.7 cm   32 w 6 d
AC:  26.5 cm   30 w 4 d
FL:  6.2 cm   32 w 3 d

Mean GA:  32 w 1 d
Fetal indices are within normal limits.
EFW:  1104 g (H) 50th ? 75th%ile ( 3591 ? 6511 g) For 32 wks

FETAL ANATOMY (TWIN A)
Lateral Ventricles:  Visualized 
Thalami/CSP:  Visualized 
Posterior Fossa:  Visualized 
Nuchal Region:  Previously seen 
Spine:  Previously seen 
4 Chamber Heart on L:  Visualized 
Stomach on Left:  Visualized 
3 Vessel Cord:  Previously seen 
Cord Insertion Site:  Previously seen 
Kidneys:  Visualized 
Bladder:  Visualized 
Extremities:  Previously seen 

Additional Anatomy Visualized:  LVOT and female genitalia

TWIN B:
Heart Rate:  140
Movement:  Yes
Breathing:  Yes
Presentation:  Breech on the right
Placental Location:  Anterior
Grade:  II
Previa:  No
Amniotic Fluid (Subjective):  Normal
Amniotic Fluid (Objective):   5.7 cm Vertical pocket 

FETAL BIOMETRY (TWIN B)
BPD:  7.6 cm  30 w 5 d
HC:  28.5 cm  31 w 3 d
AC:  25.8 cm   30 w 0 d
FL:  6.2 cm  32 w 4 d

Mean GA:  31 w 3 d
Fetal indices are within normal limits
EFW:8998 g (H) 25th ? 50th%ile (5688 ? 3591 g) For 32 wks

FETAL ANATOMY (TWIN B)
Lateral Ventricles:  Visualized 
Thalami/CSP:  Previously seen 
Posterior Fossa:  Previously seen 
Nuchal Region:  Previously seen 
Spine:  Previously seen 
4 Chamber Heart on L:  Visualized 
Stomach on Left:  Visualized 
3 Vessel Cord:  Previously seen 
Cord Insertion Site:  Previously seen 
Kidneys:  Visualized 
Bladder:  Visualized 
Extremities:  Previously seen 

Additional Anatomy Visualized:  Diaphragm and female genitalia

MATERNAL FINDINGS
Cervix:  3.5 cm Transabdominally
IMPRESSION: Twin gestation with twin A demonstrating an estimated gestational age by ultrasound of 32 weeks and 1 day and twin B of 31 weeks and 3 days.  Currently the estimated fetal weight for twin A is stable between the 50th and 75th percentile.  The estimated fetal weight percentile for twin B has dropped from the 50th to 75th percentile range to between the 25th and 50th percentile range.  A greater than 25 percentile difference between the twins was not felt to be present however and for this reason doppler evaluation on twin B was not performed.  Continued close follow-up for growth would be recommended.
Subjectively and quantitatively normal amniotic fluid volume was noted for both twins.  Normal cervical length.
No late developing fetal anatomic abnormalities are identified associated with the lateral ventricles, four chamber heart, stomach, kidneys or bladder for either twin.  The patient was sent with a preliminary report to her office visit immediately following this exam.

## 2006-07-14 ENCOUNTER — Emergency Department (HOSPITAL_COMMUNITY): Admission: EM | Admit: 2006-07-14 | Discharge: 2006-07-14 | Payer: Self-pay | Admitting: Emergency Medicine

## 2006-08-20 ENCOUNTER — Encounter: Admission: RE | Admit: 2006-08-20 | Discharge: 2006-11-18 | Payer: Self-pay | Admitting: Family Medicine

## 2006-09-03 DIAGNOSIS — B3731 Acute candidiasis of vulva and vagina: Secondary | ICD-10-CM

## 2006-09-03 DIAGNOSIS — B9689 Other specified bacterial agents as the cause of diseases classified elsewhere: Secondary | ICD-10-CM

## 2006-09-03 DIAGNOSIS — B373 Candidiasis of vulva and vagina: Secondary | ICD-10-CM

## 2006-09-03 HISTORY — DX: Candidiasis of vulva and vagina: B37.3

## 2006-09-03 HISTORY — DX: Other specified bacterial agents as the cause of diseases classified elsewhere: B96.89

## 2006-09-03 HISTORY — DX: Acute candidiasis of vulva and vagina: B37.31

## 2007-02-23 ENCOUNTER — Emergency Department (HOSPITAL_COMMUNITY): Admission: EM | Admit: 2007-02-23 | Discharge: 2007-02-23 | Payer: Self-pay | Admitting: Family Medicine

## 2007-07-09 ENCOUNTER — Emergency Department (HOSPITAL_COMMUNITY): Admission: EM | Admit: 2007-07-09 | Discharge: 2007-07-09 | Payer: Self-pay | Admitting: Family Medicine

## 2007-10-08 ENCOUNTER — Encounter (INDEPENDENT_AMBULATORY_CARE_PROVIDER_SITE_OTHER): Payer: Self-pay | Admitting: Obstetrics and Gynecology

## 2007-10-08 ENCOUNTER — Ambulatory Visit (HOSPITAL_COMMUNITY): Admission: RE | Admit: 2007-10-08 | Discharge: 2007-10-08 | Payer: Self-pay | Admitting: Obstetrics and Gynecology

## 2007-12-11 ENCOUNTER — Emergency Department (HOSPITAL_COMMUNITY): Admission: EM | Admit: 2007-12-11 | Discharge: 2007-12-11 | Payer: Self-pay | Admitting: Family Medicine

## 2008-09-03 DIAGNOSIS — B373 Candidiasis of vulva and vagina: Secondary | ICD-10-CM

## 2008-09-03 DIAGNOSIS — IMO0002 Reserved for concepts with insufficient information to code with codable children: Secondary | ICD-10-CM

## 2008-09-03 DIAGNOSIS — N870 Mild cervical dysplasia: Secondary | ICD-10-CM

## 2008-09-03 DIAGNOSIS — B3731 Acute candidiasis of vulva and vagina: Secondary | ICD-10-CM

## 2008-09-03 DIAGNOSIS — R87619 Unspecified abnormal cytological findings in specimens from cervix uteri: Secondary | ICD-10-CM

## 2008-09-03 DIAGNOSIS — Z87440 Personal history of urinary (tract) infections: Secondary | ICD-10-CM

## 2008-09-03 HISTORY — DX: Candidiasis of vulva and vagina: B37.3

## 2008-09-03 HISTORY — DX: Acute candidiasis of vulva and vagina: B37.31

## 2008-09-03 HISTORY — DX: Mild cervical dysplasia: N87.0

## 2008-09-03 HISTORY — DX: Personal history of urinary (tract) infections: Z87.440

## 2008-09-03 HISTORY — DX: Unspecified abnormal cytological findings in specimens from cervix uteri: R87.619

## 2008-09-03 HISTORY — DX: Reserved for concepts with insufficient information to code with codable children: IMO0002

## 2008-12-28 ENCOUNTER — Inpatient Hospital Stay (HOSPITAL_COMMUNITY): Admission: EM | Admit: 2008-12-28 | Discharge: 2008-12-28 | Payer: Self-pay | Admitting: Emergency Medicine

## 2009-02-03 ENCOUNTER — Encounter: Admission: RE | Admit: 2009-02-03 | Discharge: 2009-02-03 | Payer: Self-pay | Admitting: Internal Medicine

## 2009-03-14 ENCOUNTER — Encounter (INDEPENDENT_AMBULATORY_CARE_PROVIDER_SITE_OTHER): Payer: Self-pay | Admitting: Obstetrics and Gynecology

## 2009-03-14 ENCOUNTER — Ambulatory Visit (HOSPITAL_COMMUNITY): Admission: RE | Admit: 2009-03-14 | Discharge: 2009-03-14 | Payer: Self-pay | Admitting: Obstetrics and Gynecology

## 2009-09-03 DIAGNOSIS — O139 Gestational [pregnancy-induced] hypertension without significant proteinuria, unspecified trimester: Secondary | ICD-10-CM

## 2009-09-03 HISTORY — DX: Gestational (pregnancy-induced) hypertension without significant proteinuria, unspecified trimester: O13.9

## 2010-01-15 ENCOUNTER — Inpatient Hospital Stay (HOSPITAL_COMMUNITY): Admission: AD | Admit: 2010-01-15 | Discharge: 2010-01-15 | Payer: Self-pay | Admitting: Obstetrics and Gynecology

## 2010-01-18 ENCOUNTER — Inpatient Hospital Stay (HOSPITAL_COMMUNITY): Admission: AD | Admit: 2010-01-18 | Discharge: 2010-01-18 | Payer: Self-pay | Admitting: Obstetrics and Gynecology

## 2010-04-17 ENCOUNTER — Ambulatory Visit (HOSPITAL_COMMUNITY): Admission: RE | Admit: 2010-04-17 | Discharge: 2010-04-17 | Payer: Self-pay | Admitting: Obstetrics and Gynecology

## 2010-05-09 ENCOUNTER — Observation Stay (HOSPITAL_COMMUNITY): Admission: AD | Admit: 2010-05-09 | Discharge: 2010-05-10 | Payer: Self-pay | Admitting: Obstetrics and Gynecology

## 2010-05-15 ENCOUNTER — Observation Stay (HOSPITAL_COMMUNITY): Admission: AD | Admit: 2010-05-15 | Discharge: 2010-05-18 | Payer: Self-pay | Admitting: Obstetrics and Gynecology

## 2010-05-20 ENCOUNTER — Inpatient Hospital Stay (HOSPITAL_COMMUNITY): Admission: AD | Admit: 2010-05-20 | Discharge: 2010-05-28 | Payer: Self-pay | Admitting: Obstetrics and Gynecology

## 2010-07-06 DIAGNOSIS — O165 Unspecified maternal hypertension, complicating the puerperium: Secondary | ICD-10-CM

## 2010-07-06 HISTORY — DX: Unspecified maternal hypertension, complicating the puerperium: O16.5

## 2010-09-03 DIAGNOSIS — E269 Hyperaldosteronism, unspecified: Secondary | ICD-10-CM

## 2010-09-03 HISTORY — DX: Hyperaldosteronism, unspecified: E26.9

## 2010-11-02 ENCOUNTER — Inpatient Hospital Stay (HOSPITAL_COMMUNITY)
Admission: AD | Admit: 2010-11-02 | Discharge: 2010-11-02 | Disposition: A | Discharge status: Home / Self Care | Payer: BC Managed Care – PPO | Source: Ambulatory Visit | Attending: Obstetrics and Gynecology | Admitting: Obstetrics and Gynecology

## 2010-11-02 DIAGNOSIS — O135 Gestational [pregnancy-induced] hypertension without significant proteinuria, complicating the puerperium: Secondary | ICD-10-CM | POA: Insufficient documentation

## 2010-11-02 LAB — COMPREHENSIVE METABOLIC PANEL
Alkaline Phosphatase: 77 U/L (ref 39–117)
BUN: 11 mg/dL (ref 6–23)
Calcium: 9 mg/dL (ref 8.4–10.5)
Chloride: 104 mEq/L (ref 96–112)
GFR calc Af Amer: >60 mL/min (ref >60)
Sodium: 139 mEq/L (ref 135–145)
Total Bilirubin: 0.2 mg/dL — ABNORMAL LOW (ref 0.3–1.2)

## 2010-11-16 LAB — CBC
HCT: 27 % — ABNORMAL LOW (ref 36.0–46.0)
HCT: 28.8 % — ABNORMAL LOW (ref 36.0–46.0)
HCT: 29.7 % — ABNORMAL LOW (ref 36.0–46.0)
HCT: 30.9 % — ABNORMAL LOW (ref 36.0–46.0)
HCT: 31 % — ABNORMAL LOW (ref 36.0–46.0)
HCT: 31.5 % — ABNORMAL LOW (ref 36.0–46.0)
Hemoglobin: 8.8 g/dL — ABNORMAL LOW (ref 12.0–15.0)
Hemoglobin: 9.2 g/dL — ABNORMAL LOW (ref 12.0–15.0)
Hemoglobin: 10 g/dL — ABNORMAL LOW (ref 12.0–15.0)
Hemoglobin: 10.1 g/dL — ABNORMAL LOW (ref 12.0–15.0)
MCH: 22.3 pg — ABNORMAL LOW (ref 26.0–34.0)
MCH: 22.7 pg — ABNORMAL LOW (ref 26.0–34.0)
MCH: 22.8 pg — ABNORMAL LOW (ref 26.0–34.0)
MCH: 22.9 pg — ABNORMAL LOW (ref 26.0–34.0)
MCH: 22.5 pg — ABNORMAL LOW (ref 26.0–34.0)
MCH: 22.6 pg — ABNORMAL LOW (ref 26.0–34.0)
MCHC: 32.3 g/dL (ref 30.0–36.0)
MCHC: 32.5 g/dL (ref 30.0–36.0)
MCHC: 32.3 g/dL (ref 30.0–36.0)
MCV: 69.6 fL — ABNORMAL LOW (ref 78.0–100.0)
MCV: 70.2 fL — ABNORMAL LOW (ref 78.0–100.0)
MCV: 70.5 fL — ABNORMAL LOW (ref 78.0–100.0)
MCV: 70.2 fL — ABNORMAL LOW (ref 78.0–100.0)
Platelets: 254 10*3/uL (ref 150–400)
Platelets: 305 10*3/uL (ref 150–400)
RBC: 3.83 MIL/uL — ABNORMAL LOW (ref 3.87–5.11)
RBC: 4.13 MIL/uL (ref 3.87–5.11)
RBC: 4.48 MIL/uL (ref 3.87–5.11)
RDW: 16 % — ABNORMAL HIGH (ref 11.5–15.5)
RDW: 16.3 % — ABNORMAL HIGH (ref 11.5–15.5)
RDW: 15.6 % — ABNORMAL HIGH (ref 11.5–15.5)
WBC: 11.7 10*3/uL — ABNORMAL HIGH (ref 4.0–10.5)
WBC: 10.2 10*3/uL (ref 4.0–10.5)
WBC: 9.3 10*3/uL (ref 4.0–10.5)

## 2010-11-16 LAB — COMPREHENSIVE METABOLIC PANEL
ALT: 14 U/L (ref 0–35)
ALT: 14 U/L (ref 0–35)
ALT: 10 U/L (ref 0–35)
ALT: 12 U/L (ref 0–35)
AST: 15 U/L (ref 0–37)
AST: 20 U/L (ref 0–37)
Albumin: 2.2 g/dL — ABNORMAL LOW (ref 3.5–5.2)
Albumin: 2.4 g/dL — ABNORMAL LOW (ref 3.5–5.2)
Albumin: 2.6 g/dL — ABNORMAL LOW (ref 3.5–5.2)
Alkaline Phosphatase: 113 U/L (ref 39–117)
Alkaline Phosphatase: 125 U/L — ABNORMAL HIGH (ref 39–117)
Alkaline Phosphatase: 155 U/L — ABNORMAL HIGH (ref 39–117)
BUN: 11 mg/dL (ref 6–23)
BUN: 12 mg/dL (ref 6–23)
BUN: 15 mg/dL (ref 6–23)
BUN: 10 mg/dL (ref 6–23)
CO2: 23 mEq/L (ref 19–32)
CO2: 23 mEq/L (ref 19–32)
CO2: 26 mEq/L (ref 19–32)
CO2: 22 mEq/L (ref 19–32)
CO2: 22 mEq/L (ref 19–32)
Calcium: 8.7 mg/dL (ref 8.4–10.5)
Calcium: 8.9 mg/dL (ref 8.4–10.5)
Calcium: 8.7 mg/dL (ref 8.4–10.5)
Calcium: 8.8 mg/dL (ref 8.4–10.5)
Chloride: 100 mEq/L (ref 96–112)
Chloride: 108 mEq/L (ref 96–112)
Chloride: 107 mEq/L (ref 96–112)
Creatinine, Ser: 0.64 mg/dL (ref 0.4–1.2)
Creatinine, Ser: 0.76 mg/dL (ref 0.4–1.2)
Creatinine, Ser: 0.76 mg/dL (ref 0.4–1.2)
Creatinine, Ser: 0.66 mg/dL (ref 0.4–1.2)
Creatinine, Ser: 0.74 mg/dL (ref 0.4–1.2)
Creatinine, Ser: 0.74 mg/dL (ref 0.4–1.2)
GFR calc Af Amer: >60 mL/min (ref >60)
GFR calc Af Amer: >60 mL/min (ref >60)
GFR calc Af Amer: >60 mL/min (ref >60)
GFR calc non Af Amer: >60 mL/min (ref >60)
GFR calc non Af Amer: >60 mL/min (ref >60)
GFR calc non Af Amer: >60 mL/min (ref >60)
GFR calc non Af Amer: >60 mL/min (ref >60)
GFR calc non Af Amer: >60 mL/min (ref >60)
Glucose, Bld: 102 mg/dL — ABNORMAL HIGH (ref 70–99)
Glucose, Bld: 83 mg/dL (ref 70–99)
Glucose, Bld: 85 mg/dL (ref 70–99)
Glucose, Bld: 83 mg/dL (ref 70–99)
Glucose, Bld: 85 mg/dL (ref 70–99)
Potassium: 3.6 mEq/L (ref 3.5–5.1)
Potassium: 4.1 mEq/L (ref 3.5–5.1)
Potassium: 6.4 mEq/L (ref 3.5–5.1)
Sodium: 137 mEq/L (ref 135–145)
Sodium: 138 mEq/L (ref 135–145)
Sodium: 133 mEq/L — ABNORMAL LOW (ref 135–145)
Total Bilirubin: 0.3 mg/dL (ref 0.3–1.2)
Total Bilirubin: 0.4 mg/dL (ref 0.3–1.2)
Total Bilirubin: 0.5 mg/dL (ref 0.3–1.2)
Total Bilirubin: 0.4 mg/dL (ref 0.3–1.2)
Total Bilirubin: 1.5 mg/dL — ABNORMAL HIGH (ref 0.3–1.2)
Total Protein: 5.6 g/dL — ABNORMAL LOW (ref 6.0–8.3)
Total Protein: 6 g/dL (ref 6.0–8.3)
Total Protein: 6.2 g/dL (ref 6.0–8.3)
Total Protein: 6.3 g/dL (ref 6.0–8.3)

## 2010-11-16 LAB — URINALYSIS, ROUTINE W REFLEX MICROSCOPIC
Glucose, UA: NEGATIVE mg/dL
Hgb urine dipstick: NEGATIVE
Ketones, ur: NEGATIVE mg/dL
Nitrite: NEGATIVE
Protein, ur: NEGATIVE mg/dL
Specific Gravity, Urine: 1.01 (ref 1.005–1.030)
Urobilinogen, UA: 1 mg/dL (ref 0.0–1.0)
pH: 6.5 (ref 5.0–8.0)

## 2010-11-16 LAB — PROTEIN, URINE, 24 HOUR
Collection Interval-UPROT: 12 hours
Protein, 24H Urine: 140 mg/d — ABNORMAL HIGH (ref 50–100)
Protein, 24H Urine: 144 mg/d — ABNORMAL HIGH (ref 50–100)
Urine Total Volume-UPROT: 800 mL

## 2010-11-16 LAB — CREATININE CLEARANCE, URINE, 24 HOUR
Collection Interval-CRCL: 24 hours
Creatinine Clearance: 167 mL/min — ABNORMAL HIGH (ref 75–115)
Creatinine, Urine: 76.8 mg/dL
Creatinine: 0.64 mg/dL (ref 0.4–1.2)

## 2010-11-16 LAB — RPR: RPR Ser Ql: NONREACTIVE

## 2010-11-16 LAB — DIFFERENTIAL
Eosinophils Absolute: 0.1 10*3/uL (ref 0.0–0.7)
Lymphocytes Relative: 18 % (ref 12–46)
Lymphs Abs: 1.7 10*3/uL (ref 0.7–4.0)
Monocytes Relative: 7 % (ref 3–12)
Neutro Abs: 6.9 10*3/uL (ref 1.7–7.7)
Neutrophils Relative %: 73 % (ref 43–77)

## 2010-11-16 LAB — LACTATE DEHYDROGENASE
LDH: 102 U/L (ref 94–250)
LDH: 392 U/L — ABNORMAL HIGH (ref 94–250)

## 2010-11-16 LAB — URIC ACID
Uric Acid, Serum: 4.5 mg/dL (ref 2.4–7.0)
Uric Acid, Serum: 4 mg/dL (ref 2.4–7.0)

## 2010-11-16 LAB — STREP B DNA PROBE

## 2010-11-16 LAB — MRSA PCR SCREENING: MRSA by PCR: NEGATIVE

## 2010-11-20 LAB — URINALYSIS, ROUTINE W REFLEX MICROSCOPIC
Bilirubin Urine: NEGATIVE
Glucose, UA: NEGATIVE mg/dL
Hgb urine dipstick: NEGATIVE
Ketones, ur: NEGATIVE mg/dL
Ketones, ur: NEGATIVE mg/dL
Protein, ur: NEGATIVE mg/dL
Protein, ur: NEGATIVE mg/dL
Urobilinogen, UA: 2 mg/dL — ABNORMAL HIGH (ref 0.0–1.0)

## 2010-11-20 LAB — URINE MICROSCOPIC-ADD ON

## 2010-12-10 LAB — PREGNANCY, URINE: Preg Test, Ur: NEGATIVE

## 2010-12-13 LAB — CULTURE, BLOOD (ROUTINE X 2)
Culture: NO GROWTH
Culture: NO GROWTH

## 2010-12-13 LAB — CK TOTAL AND CKMB (NOT AT ARMC)
CK, MB: 0.7 ng/mL (ref 0.3–4.0)
Relative Index: 0.6 (ref 0.0–2.5)

## 2010-12-13 LAB — HEMOGLOBIN A1C: Mean Plasma Glucose: 117 mg/dL

## 2010-12-13 LAB — DIFFERENTIAL
Lymphocytes Relative: 10 % — ABNORMAL LOW (ref 12–46)
Lymphs Abs: 0.8 10*3/uL (ref 0.7–4.0)
Monocytes Absolute: 0.4 10*3/uL (ref 0.1–1.0)
Monocytes Relative: 4 % (ref 3–12)
Neutro Abs: 7 10*3/uL (ref 1.7–7.7)
Neutrophils Relative %: 84 % — ABNORMAL HIGH (ref 43–77)

## 2010-12-13 LAB — COMPREHENSIVE METABOLIC PANEL
AST: 19 U/L (ref 0–37)
Albumin: 3.4 g/dL — ABNORMAL LOW (ref 3.5–5.2)
Alkaline Phosphatase: 75 U/L (ref 39–117)
BUN: 3 mg/dL — ABNORMAL LOW (ref 6–23)
GFR calc Af Amer: >60 mL/min (ref >60)
Potassium: 3 mEq/L — ABNORMAL LOW (ref 3.5–5.1)
Sodium: 140 mEq/L (ref 135–145)
Total Protein: 6.8 g/dL (ref 6.0–8.3)

## 2010-12-13 LAB — URINALYSIS, ROUTINE W REFLEX MICROSCOPIC
Bilirubin Urine: NEGATIVE
Glucose, UA: NEGATIVE mg/dL
Ketones, ur: NEGATIVE mg/dL
Leukocytes, UA: NEGATIVE
Nitrite: NEGATIVE
Specific Gravity, Urine: 1.004 — ABNORMAL LOW (ref 1.005–1.030)
pH: 7.5 (ref 5.0–8.0)

## 2010-12-13 LAB — CBC
HCT: 31.1 % — ABNORMAL LOW (ref 36.0–46.0)
Hemoglobin: 10 g/dL — ABNORMAL LOW (ref 12.0–15.0)
Platelets: 242 10*3/uL (ref 150–400)
RBC: 4.69 MIL/uL (ref 3.87–5.11)
RDW: 15.3 % (ref 11.5–15.5)
WBC: 8.3 10*3/uL (ref 4.0–10.5)

## 2010-12-13 LAB — POTASSIUM: Potassium: 2 mEq/L — CL (ref 3.5–5.1)

## 2010-12-13 LAB — URINE MICROSCOPIC-ADD ON

## 2010-12-13 LAB — CARDIAC PANEL(CRET KIN+CKTOT+MB+TROPI)
CK, MB: 2 ng/mL (ref 0.3–4.0)
CK, MB: 2.5 ng/mL (ref 0.3–4.0)
Relative Index: 1.4 (ref 0.0–2.5)

## 2010-12-13 LAB — TSH: TSH: 1.82 u[IU]/mL (ref 0.350–4.500)

## 2010-12-13 LAB — BASIC METABOLIC PANEL
BUN: 3 mg/dL — ABNORMAL LOW (ref 6–23)
Calcium: 7.1 mg/dL — ABNORMAL LOW (ref 8.4–10.5)
GFR calc non Af Amer: >60 mL/min (ref >60)
Glucose, Bld: 94 mg/dL (ref 70–99)
Sodium: 143 mEq/L (ref 135–145)

## 2010-12-13 LAB — POCT CARDIAC MARKERS: Troponin i, poc: <0.05 ng/mL (ref 0.00–0.09)

## 2010-12-13 LAB — LIPID PANEL
Triglycerides: 53 mg/dL (ref <150)
VLDL: 11 mg/dL (ref 0–40)

## 2010-12-13 LAB — PROTIME-INR: INR: 1.2 (ref 0.00–1.49)

## 2010-12-13 LAB — POCT PREGNANCY, URINE: Preg Test, Ur: NEGATIVE

## 2010-12-13 LAB — TROPONIN I: Troponin I: <0.01 ng/mL (ref 0.00–0.06)

## 2010-12-13 LAB — MAGNESIUM: Magnesium: 1.5 mg/dL (ref 1.5–2.5)

## 2011-01-16 NOTE — H&P (Signed)
Krista [name], Krista [name]               ACCOUNT NO.:  [id number]   MEDICAL RECORD NO.:  [id number]          PATIENT TYPE:  INP   LOCATION:  0103                         FACILITY:  [facility]   PHYSICIAN:  Michiel Cowboy, MDDATE OF BIRTH:  1980-09-09   DATE OF ADMISSION:  12/27/2008  DATE OF DISCHARGE:                              HISTORY & PHYSICAL   PRIMARY CARE Kensi Karr:  Any Harl Bowie, P.A.-C with [facility] [place].   CHIEF COMPLAINT:  Tingling all over.   HISTORY OF THE PRESENT ILLNESS:  The patient is a 30 year old female  with a past medical history significant for hypertension and hypokalemia  who presents with symptoms of tingling all over that started today.  Prior to this she had not been feeling quite well and thought it was  secondary to her sinuses as she had some drainage and congestion, which  she thought were either from her sinuses or her allergies.  She felt  tingling in her chest, but had no chest pain and no shortness of breath.  The patient presented to the emergency department where her temperature  was noted to be 101.7.  She was not aware of having a fever.   PAST MEDICAL HISTORY:  The past medical history is significant for:  1. Hypertension.  2. Hypokalemia.  Of note, the patient is supposedly followed up by endocrinology for her  hypokalemia, but she stopped taking her potassium pills because they  were too big; and, she is currently not taking anything for her  hypertension and cannot quite remember what she is supposed to be on.   REVIEW OF SYSTEMS:  The review of systems is unremarkable, except for  the above.  No headache.  No cough.  No urinary complaints.  Otherwise  the review of systems unremarkable, except for generalized weakness.   SOCIAL HISTORY:  The patient reports no drug abuse.  She does not smoke  or drink, and does not abuse.  She has twin girls at home.   FAMILY HISTORY:  The family history is noncontributory.   ALLERGIES:  THE PATIENT  IS ALLERGIC TO SULFA MEDICATIONS.   PHYSICAL EXAMINATION:  VITAL SIGNS:  Temperature initially was 101.7 and  is now down to 97.8.  Blood pressure initially was 171/111 and is now  down to 159/86.  Pulse initially was 128 and is now down to 85.  Respirations are 18 and she is satting at 100% on room air.  GENERAL APPEARANCE:  The patient appears to be in no acute distress.  HEENT:  The head is atraumatic.  Somewhat dry mucous membranes, but  normal skin turgor.  LUNGS:  The lungs are clear to auscultation bilaterally, but with poor  effort.  HEART:  The heart has a regular rate and rhythm.  No murmurs  appreciated.  ABDOMEN:  The abdomen is soft, nontender and nondistended.  EXTREMITIES:  The extremities are without clubbing, cyanosis or edema.  NEUROLOGIC EXAMINATION:  The patient is neurologically intact.  Strength  is 5/5 in all four extremities.  Sensation is intact.  Cranial nerves II-  XII are intact.  Reflexes  are intact at 2+ bilaterally.  SKIN:  The skin is clean, dry and intact.  Multiple tattoos are present.   LABORATORY DATA:  White blood cell count 8.3 and hemoglobin 10.  Cardiac  enzymes were negative.  Potassium 2.0 and magnesium 1.5.  No other labs  were obtained.  Pregnancy test was negative.  UA showed small blood and  many bacteria, but no white blood cell and no nitrites.  Chest x-ray  showed no cardiopulmonary disease.  EKG showed sinus tachycardia with a  ventricular rate of 124 with questionable U waves present in all leads.   ASSESSMENT AND PLAN:  This is a 30 year old female with a history of  hypokalemia of unclear etiology as well as hypertension; and, who  stopped taking her medications, and presents with hypokalemia and fever  of unclear etiology.  1. Decreased potassium.  We will replace this.  The patient likely      needs to be on a potassium replacement and we recommend using      liquid as she does not want to swallow pills.  The patient       supposedly had a workup done as an outpatient for which we may need      to contact her endocrinologist, whose name she cannot quite      remember; but, will try to figure this out.  2. Hypertension.  For right now we will start her on Norvasc.  3. Fever of unclear etiology.  We will repeat the urinalysis as the      first one appeared to have been possibly contaminated.  We will      obtain a urine culture and blood cultures.  We will hold off on      antibiotics because there is currently no source.  The patient does      not appear toxic.  4. Prophylaxes.  Protonix and Lovenox.  5. Chest discomfort.  This is not quite a pain and is likely a      tingling sensation secondary to hypokalemia.  For completion sake      we will cycle cardiac enzymes, check a fasting lipid panel, check      hemoglobin A-1-C and check a thyroid stimulating hormone.      Michiel Cowboy, MD  Electronically Signed     AVD/MEDQ  D:  12/28/2008  T:  12/28/2008  Job:  [id number]   cc:   Erling Conte, P.A.-C

## 2011-01-16 NOTE — Op Note (Signed)
Krista [name], Krista [name]               ACCOUNT NO.:  [id number]   MEDICAL RECORD NO.:  [id number]          PATIENT TYPE:  AMB   LOCATION:  SDC                           FACILITY:  [facility]   PHYSICIAN:  Dois Davenport A. Rivard, M.D. DATE OF BIRTH:  12-Jan-1981   DATE OF PROCEDURE:  03/14/2009  DATE OF DISCHARGE:  03/14/2009                               OPERATIVE REPORT   PREOPERATIVE DIAGNOSIS:  CIN I or mild dysplasia on positive  endocervical curettage.   POSTOPERATIVE DIAGNOSIS:  CIN I or mild dysplasia on positive  endocervical curettage.   ANESTHESIA:  Local, Dr. Estanislado Pandy.   PROCEDURE:  LEEP or loop electrical excision procedure.   SURGEON:  Crist Fat. Rivard, MD   ASSISTANT:  None.   ESTIMATED BLOOD LOSS:  Minimal.   DESCRIPTION OF PROCEDURE:  After being informed of the planned procedure  with possible complications including bleeding, infection, persistence  of disease, cervical incompetence, cervical stenosis, informed consent  was obtained.  The patient was taken to OR #3, placed in lithotomy  position.  She was draped.  A speculum was inserted in her vagina.  Her  cervix was prepped with acetic acid and colposcopy again reveals normal  exam of the ectocervix.  We proceeded with a paracervical block using  lidocaine 1% with epinephrine 1:200,000 in the usual fashion.  Using a  medium sized loop, we proceeded with one pass LEEP to encompass the  whole T zone.  This was done easily.  The excision area was then  cauterized with bipolar cauterization and Monsel was applied.  Instruments were removed.  Instrument and sponge count was complete x2.  Estimated blood loss was minimal.  The procedure was well tolerated by  the patient, who was taken to the recovery room and discharged home in  well and stable condition.   SPECIMEN:  Cervical LEEP sent to Pathology.      [name], M.D.  Electronically Signed     SAR/MEDQ  D:  03/14/2009  T:  03/15/2009  Job:  [id number]

## 2011-01-16 NOTE — Op Note (Signed)
NAMESHANDIE, Krista              ACCOUNT NO.:  [id number]   MEDICAL RECORD NO.:  [id number]          PATIENT TYPE:  AMB   LOCATION:  SDC                           FACILITY:  [facility]   PHYSICIAN:  Dois Davenport A. Rivard, M.D. DATE OF BIRTH:  September 25, 1980   DATE OF PROCEDURE:  10/08/2007  DATE OF DISCHARGE:                               OPERATIVE REPORT   PREOPERATIVE DIAGNOSIS:  Blighted ovum.   POSTOPERATIVE DIAGNOSIS:  Blighted ovum.   ANESTHESIA:  IV sedation and paracervical block.   PROCEDURE:  Dilation and evacuation.   SURGEON:  Dr. Estanislado Pandy; no assistant.   ESTIMATED BLOOD LOSS:  Minimal.   PROCEDURE:  After being informed of the planned procedure with possible  complications including bleeding, infection, injury to uterus and  retained products of conception, informed consent is obtained.  The  patient is taken to OR #4, given IV sedation and placed in the lithotomy  position.  She is prepped and draped in a sterile fashion, and her  bladder is emptied with an in-and-out red rubber catheter.  GYN exam  reveals an anteverted uterus about 8 weeks in size, mobile, two normal  adnexa, and a closed cervix.   A weighted speculum is inserted.  Anterior lip of the cervix is grasped  with tenaculum forceps, and we proceed with a paracervical block using  15 mL of Novocain 1% in the usual fashion.  The uterus is then sounded  at 10 cm, and the cervix is easily dilated using Hegar dilator until #31  which allows easy entry of a #8 curved cannula.  With this cannula, we  proceed with evacuation of products of conception which removes a fair  amount of product.   After evacuation is completed, we use a sharp curette to assess the  uterine walls which are felt to be free of tissue including both cornua.  Instruments are removed.  Instrument and sponge count is complete x2.  Estimated blood loss is minimal.  The procedure is very well tolerated  by the patient who is taken to recovery room  and discharged home in a  well and stable condition.   Specimen: products of conception to pathology      Dois Davenport A. Rivard, M.D.  Electronically Signed     SAR/MEDQ  D:  10/08/2007  T:  10/09/2007  Job:  [id number]

## 2011-01-19 NOTE — Op Note (Signed)
[name], Krista [name]              ACCOUNT NO.:  [id number]   MEDICAL RECORD NO.:  [id number]          PATIENT TYPE:  AMB   LOCATION:  DSC                          FACILITY:  [facility]   PHYSICIAN:  Earvin Hansen L. Truesdale, M.D.DATE OF BIRTH:  1981/02/08   DATE OF PROCEDURE:  08/07/2004  DATE OF DISCHARGE:                                 OPERATIVE REPORT   This 30 year old lady with severe macromastia, back and shoulder pain  secondary to large, pendulous breasts increased intertriginous changes in  the past, requiring use of talc's, powders, creams, and antifungal  medications.  The patient also wears a special bra, a triple D bra.  With  the resultant failure of conservative treatment, the patient has medical  indications for reductions.   PROCEDURE:  Bilateral reduction mammoplasty using the inferior pedicle  technique.   ANESTHESIA:  General.   The patient underwent general anesthesia  after being drawn for the inferior  pedicle reduction mammoplasty, 0.25% Xylocaine with epinephrine was injected  locally after she had been intubated.  Prep was done to the chest, breast  areas in routine fashion using Hibiclens soap and solution and walled out  with sterile towels and draped so as to make a sterile field.  The wounds  were scored with #15 blade.  The the skin of inferior pedicles  epithelialized with #20 blades.  Medial and lateral fatty dermal pedicles  were excised down to underlying fascial.  After proper hemostasis, the new  keyhole area was debulked as well as laterally more accessory breast tissue  removed.  After this, the flaps were transposed and stayed with 3-0 Prolene.  Subcutaneous closure was done with 3-0 Monocryl x 2 layers, then a running  subcuticular stitch of 3-0 and 5-0 Monocryl throughout the inverted T.  The  wounds were drained with #10 Blake drains for the fluid.  It was placed in  the depth of the wound  and brought out through the lateral-most portion of  the  incision and secured it with 3-0 Prolene.  Steri-Strips and sterile  dressings were applied to all the areas including 4 x 4's, ABDs, Hypafix  tape.  She withstood the procedure very well.  Estimated blood loss 100 ml,  complications none.      Maree Erie   GLT/MEDQ  D:  08/07/2004  T:  08/07/2004  Job:  [id number]

## 2011-01-19 NOTE — Op Note (Signed)
Krista [name], Krista [name]              ACCOUNT NO.:  [id number]   MEDICAL RECORD NO.:  [id number]          PATIENT TYPE:  AMB   LOCATION:  SDC                           FACILITY:  [facility]   PHYSICIAN:  Dois Davenport A. Rivard, M.D. DATE OF BIRTH:  10-20-1980   DATE OF PROCEDURE:  12/28/2005  DATE OF DISCHARGE:                                 OPERATIVE REPORT   PREOPERATIVE DIAGNOSIS:  Missed abortion.   POSTOPERATIVE DIAGNOSIS:  Missed abortion.   ANESTHESIA:  IV sedation with paracervical block. Dr. Estanislado Pandy.   PROCEDURE:  D&E.   SURGEON:  Dr. Estanislado Pandy.   ESTIMATED BLOOD LOSS:  Minimal.   PROCEDURE:  After being informed of the planned procedure with possible  complications including bleeding, infection, injury to uterus and retained  products of conception, informed consent is obtained. The patient is taken  to OR #4, given IV sedation without complication.  She is placed in the  lithotomy position, prepped and draped in a sterile fashion, and her bladder  is emptied with an in-and-out Foley catheter.  GYN exam reveals an  anteverted uterus, 8-10 weeks in size, with a closed cervix and two normal  adnexa.  A weighted speculum is inserted. Anterior lip of the cervix is  grasped with Christella Hartigan forceps, and we proceed with a paracervical block using  Novocain 1% 20 cc in the usual fashion.  Uterus is then sounded at 11 cm,  and the cervix is easily dilated using Hegar dilator until #31.  This allows  easy entry of a #8 curved cannula.  We then proceed with evacuation of the  uterine cavity which removes a large quantity of normal-appearing products  of conception.  After evacuation, a sharp curette is used to assess the  uterine cavity which is felt to be free of remaining tissue.  Jacobs forceps  is removed, and at the site of application, bleeding is noted. Two figure-of-  eight stitches of 3-0 chromic controls the situation.  Instruments are then  removed.  Instruments and sponge count is complete  x2.  Estimated blood loss  is minimal.  The procedure is well tolerated by the patient who is taken to  recovery room in a well and stable condition.      [name], M.D.  Electronically Signed     SAR/MEDQ  D:  12/28/2005  T:  12/29/2005  Job:  [id number]

## 2011-01-19 NOTE — Discharge Summary (Signed)
NAMECORINTHIAN, Krista [name]                        ACCOUNT NO.:  [id number]   MEDICAL RECORD NO.:  [id number]                   PATIENT TYPE:  OBV   LOCATION:  9302                                 [facility]   PHYSICIAN:  Hal Morales, M.D.             DATE OF BIRTH:  [birth year]   DATE OF ADMISSION:  08/03/2003  DATE OF DISCHARGE:  08/04/2003                                 DISCHARGE SUMMARY   ADMISSION DIAGNOSES:  Nausea and vomiting at 8 weeks pregnancy with fever of  unknown origin.   DISCHARGE DIAGNOSES:  1. Intrauterine pregnancy at 8 weeks, twin gestation.  2. Nausea and vomiting.  3. Anemia.   HISTORY OF PRESENT ILLNESS:  The patient is a 30 year old gravida 1, para 0  who presented from the office of [facility] with nausea and vomiting in pregnancy.  She was [redacted] weeks gestation by dates.  Her ketones were +1.  She was  instructed to go to maternity admissions unit for evaluation, intravenous  hydration.   HOSPITAL COURSE:  On admission to maternity admissions her temperature was  101, recheck two hours later after hydration was 100.3 but the remained  nauseous following intravenous fluids and Phenergan.  In light of elevated  temperature, the patient was the patient was kept overnight for observation.  Her CBC on August 03, 2003 was white blood cell count 11.2, hemoglobin  11.9, hematocrit 36.5, platelet count 348,000, differential showed  neutrophils of 83% and absolute granulocytes at 9.3.  On August 04, 2003  CBC remained stable with white blood cells at 7.5, hemoglobin 9.8,  hematocrit 29.1, platelet count 296,000.  Plain catch urine has remained  negative on August 03, 2003 and August 04, 2003.  Ultrasound examination  of the pregnancy this morning found twin gestation at 8 weeks.  The patient  has no further nausea and vomiting today, was able to eat breakfast and  lunch without any difficulties.  She is therefore discharged home per Dr.  Dierdre Forth.  She  has been provided with an prescription for Hemocyte  iron to take twice a day and with Phenergan 25 mg p.o. q.6h. PRN nausea and  vomiting.  She is to keep her scheduled appointment at [facility] on August 19, 2003 and is to call for any signs or symptoms of miscarriage, any increased  nausea or vomiting, inability to keep food or fluids down for greater than  24 hours or any problems or concerns.     Rica Koyanagi, C.N.M.               Hal Morales, M.D.    SDM/MEDQ  D:  08/04/2003  T:  08/04/2003  Job:  [id number]

## 2011-01-19 NOTE — H&P (Signed)
NAME:  SHADAY, RAYBORN                        ACCOUNT NO.:  [id number]   MEDICAL RECORD NO.:  [id number]                   PATIENT TYPE:  OBV   LOCATION:  9302                                 [facility]   PHYSICIAN:  Naima A. Dillard, M.D.              DATE OF BIRTH:  Dec 19, 1980   DATE OF ADMISSION:  08/03/2003  DATE OF DISCHARGE:                                HISTORY & PHYSICAL   Ms. Aydin is a 30 year old gravida 3, para 0-0-2-0, at approximately eight  weeks' gestation, who presented to maternity admissions unit from [place]  [place] [facility] for IV fluids secondary to nausea and vomiting of the  first trimester.  She reports that her nausea and vomiting had gotten  somewhat worse over the last 24 hours.  She had 1+ ketones in the office.  She was therefore sent to maternity admissions for IV fluids.  However, she  also gave a history of a fever beginning the night before.  While she was in  maternity admissions she did demonstrate temperatures ranging from 98.9 to  101.  There was no clear etiology of her fever.  She denies any viral  syndromes, dysuria, upper respiratory symptoms, sore throat, any recent  travel outside the country, or any other clear etiology of fever.  She is  therefore to be admitted for 23-hour observation for continued IV fluids, OB  ultrasound, and repeat CBC with differential in the morning of December 1.  Pregnancy has been remarkable for:   1. Nausea and vomiting of first trimester.  2. History of one SAB, one TAB.    LABORATORY DATA:  CBC shows hemoglobin of 11.9, white blood cell count of  11.2 with a slight shift, and platelets of 348.  Urine sample showed 1+  ketones in the office.  No other labs are currently pending.   MEDICATIONS:  Prenatal vitamin.   PAST OBSTETRICAL HISTORY:  The patient has a history of one SAB, one TAB.   GYNECOLOGICAL HISTORY:  Unremarkable.   PAST MEDICAL HISTORY:  The patient has a history of mild  anemia.   PAST SURGICAL HISTORY:  One previous TAB.  The patient has no previous  hospitalizations.   ALLERGIES:  None.   SOCIAL HISTORY:  The patient is single.  The patient is currently  unemployed.  The father of the baby is present with her and supportive.  She  denies any alcohol, drug, or tobacco use prior to pregnancy or since her  pregnancy began.  Her last menstrual period was June 07, 2003.   PHYSICAL EXAMINATION:  VITAL SIGNS:  Initial temperature was 101, on recheck  one hour later it was 98.9, approximately 45 minutes later it was 100.3.  Pulse is 89, respirations are 20, blood pressure is 132/61.  GENERAL:  A well-developed black female in no apparent distress with ability  to eat and drink clear liquids and crackers.  HEENT:  Within normal limits.  CHEST:  Bilateral breath sounds are clear.  CARDIAC:  Regular rate and rhythm without murmur.  BREASTS:  Soft and nontender.  ABDOMEN:  Soft and nontender with no rebound.  PELVIC:  Genitalia exam is deferred.  BACK:  Negative CVA tenderness.  EXTREMITIES:  Within normal limits.  SKIN:  Warm and dry.   ASSESSMENT:  1. First trimester nausea and vomiting.  2. Fever of unknown origin.   PLAN:  1. The patient is to be admitted for 23-hour observation per consult with     Naima A. Normand Sloop, M.D., as attending physician.  2. IV fluid hydration will continue with LR at 150 mL/hr.  3. Repeat CBC with differential will be performed the morning of August 04, 2003.  4. OB ultrasound will be done for viability and dating.  5. Blood cultures will be done if temperature is noted to be equal to or     above 101.  6. Tylenol 650 mg p.o. x1 will be given for temperature above 100.4.  7. The patient will be re-evaluated in a.m.  8. Phenergan 25 mg IV q.6h. p.r.n., Zofran 4 mg IV q.8h. p.r.n. nausea     refractory to Phenergan.     Renaldo Reel Emilee Hero, C.N.M.                   Naima A. Normand Sloop, M.D.    Leeanne Mannan  D:   08/04/2003  T:  08/04/2003  Job:  [id number]

## 2011-01-19 NOTE — Op Note (Signed)
Krista [name], TALFORD                        ACCOUNT NO.:  [id number]   MEDICAL RECORD NO.:  [id number]                   PATIENT TYPE:  INP   LOCATION:  9198                                 [facility]   PHYSICIAN:  Hal Morales, M.D.             DATE OF BIRTH:  1981-07-03   DATE OF PROCEDURE:  03/01/2004  DATE OF DISCHARGE:                                 OPERATIVE REPORT   PREOPERATIVE DIAGNOSIS:  Intrauterine pregnancy with twin gestation at 30-  1/2 weeks, malposition of second twin.   POSTOPERATIVE DIAGNOSIS:  Intrauterine pregnancy with twin gestation at 53-  1/2 weeks, malposition of second twin.   PROCEDURE:  Primary low transverse cesarean section.   ANESTHESIA:  Spinal.   ESTIMATED BLOOD LOSS:  750 cc.   COMPLICATIONS:  None.   SURGEON:  Hal Morales, M.D.   FIRST ASSISTANT:  Rica Koyanagi, C.N.M.   FINDINGS:  The patient was delivered of two female infants.  Baby A weighed  5 pounds 14 ounces with Apgars of 9 and 9 at one and five minutes,  respectively.  Baby B weighed 5 pounds 13 ounces with Apgars of 9 and 9 at  one and five minutes, respectively.  The uterus, tubes, and ovaries were  normal for the gravid state.  Each of the babies had a loose nuchal cord.   DESCRIPTION OF PROCEDURE:  The patient was taken to the operating room after  appropriate identification and was placed on the operating table.  After  placement of  a spinal anesthetic, she was placed in the supine position  with a left lateral tilt.  The abdomen and perineum were prepped with  multiple layers of Betadine and a Foley catheter inserted into the bladder  and connected to straight drainage.  The abdomen was draped as a sterile  field.   The suprapubic region was infiltrated with 20 cc of 0.25% Marcaine and a  suprapubic incision made.  The abdomen was opened in layers and the  peritoneum entered.  The bladder blade was placed.  The uterus was incised  approximately  2 cm above the uterovesical fold and that incision extended  laterally bluntly.  The first infant was delivered from the occiput  transverse position, and after having the nares and pharynx suctioned and  the loose nuchal cord reduced, was handed off to the awaiting pediatricians.  The first cord was marked with a Kelly clamp.  The second amniotic sac  remained intact until both feet had been identified and were brought into  the incision.  The amniotic sac was then ruptured and the second baby  delivered as a footling breech.  After reducing the nuchal cord and  suctioning the pharynx, the cord was clamped and cut and the infant handed  off to the awaiting pediatricians.  A cord clamp was placed on the cord from  baby B.  The uterus was massaged,  allowing the placenta to spontaneously  separate from the uterus, and it was removed from the operative field.  The  uterine incision was closed with a running interlocking suture of 0 Vicryl.  An imbricating suture of 0 Vicryl was placed with adequate hemostasis.  Copious irrigation was carried out, and the abdominal peritoneum was closed  with a running suture of 2-0 Vicryl.  The rectus muscles were reapproximated  in the midline with a figure-of-eight suture of 2-0 Vicryl.  The rectus  fascia was closed with running suture of 0 Vicryl and reinforced on either  side of midline with figure-of-eight sutures of 0 Vicryl.  The subcutaneous  tissue was made hemostatic with Bovie cautery.  Skin staples were applied to  the skin incision.  A sterile dressing was applied.   The patient was taken from the operating room to the recovery room in  satisfactory condition, having tolerated the procedure well.  Sponge and  instrument counts were correct.  Both infants went to the full-term nursery.                                               Hal Morales, M.D.    VPH/MEDQ  D:  03/01/2004  T:  03/01/2004  Job:  [id number]

## 2011-01-19 NOTE — H&P (Signed)
NAMETEDDI, BADALAMENTI                        ACCOUNT NO.:  [id number]   MEDICAL RECORD NO.:  [id number]                   PATIENT TYPE:  INP   LOCATION:  NA                                   [facility]   PHYSICIAN:  Hal Morales, M.D.             DATE OF BIRTH:  Jan 06, 1981   DATE OF ADMISSION:  03/01/2004  DATE OF DISCHARGE:                                HISTORY & PHYSICAL   Ms. Devlin is a 30 year old gravida 3, para 0, 0, 0, 2, 0, estimated date  of delivery March 13, 2004 with a twin gestation.  She is 38-weeks gestation  with a scheduled C-section.  Her pregnancy has been followed by the M.D.  service at [facility] and is remarkable for twin gestation.  Ultrasound  examination of the fetuses have shown normal growth throughout her  pregnancy, with last ultrasound on February 15, 2004 at 36 weeks, showing twin A  in a vertex position with low normal fluid, estimated fetal weight 2416 gm  in the 25th to 50th percentile.  Twin B is in a breech presentation with a  normal amount of fluid at 2236 gm in the 10th to 25th percentile.   She has been normotensive throughout her pregnancy with no proteinuria.  She  has a weight gain of 52 pounds.  She is group B strep negative, GC/Chlamydia  negative.  This patient began prenatal care on August 19, 2003 at [redacted] weeks  gestation.  She has been seen at the beginning of December for hyperemesis  and was admitted over night in the hospital.  This is where her initial  ultrasound identification of the fetuses was made at [redacted] weeks gestation.   Her prenatal lab work on August 19, 2003 shows hemoglobin and hematocrit  10.3/32.8, platelets 349,000.  Blood type and Rh B positive, antibody screen  negative.  Sickle cell trait negative.  VDRL nonreactive.  Rubella immune.  Hepatitis B surface antigen negative.  HIV nonreactive.  Pap smear within  normal limits.  GC/Chlamydia negative.  Cystic fibrosis testing negative at  28 weeks.  One hour glucose  challenge was within normal limits.  RPR  nonreactive.  Hemoglobin 9.9.  The patient has been taking iron supplement  since that time.  At 36 weeks culture of the vaginal tract is negative for  group B strep, CG/Chlamydia.   OBSTETRIC HISTORY:  In the year 2000 patient had an elective AB with no  complications, in 2002 patient had a first trimester SAB with no D&C and no  complications, and the present pregnancy.   ALLERGIES:  The patient has no known drug allergies.   PAST MEDICAL HISTORY:  1. Significant for anemia.  2. History of scoliosis.  3. Removal of wisdom teeth in the year 2000.   FAMILY HISTORY:  The patient's father with a history of heart disease and  chronic hypertension.  The patient's mother with a history  of thalassemia as  well as a history of thyroid disease.  Father with kidney disease on  dialysis.  Maternal grandmother with breast cancer.  Maternal grandfather  with bone cancer.   GENETIC HISTORY:  Father of the babies is positive for sickle cell trait.  The patient's mother has thalassemia.   SOCIAL HISTORY:  Ms. Grulke is a 30 year old African-American, single  female.  The father of her babies, Dellia Nims is involved and supportive.  They do not ascribe to a religious faith.  She denies the use of tobacco,  alcohol or illicit drugs.   REVIEW OF SYSTEMS:  No signs or symptoms suggestive of focal or systemic  disease and the patient is typical of 1 with a uterine pregnancy at term  with twins.  She has elected primary C-section for delivery.   PHYSICAL EXAMINATION:  VITAL SIGNS:  Stable, afebrile.  HEENT:  Unremarkable.  HEART:  Regular rate and rhythm.  LUNGS:  Clear.  ABDOMEN:  Gravid in its contour, uterine fundus is noted to extend 44 cm  above the level of the pubic symphysis.  As stated above, twin A is in a  vertex position.  Twin B is in a breech position.  Estimated fetal weight of  twin A is 2416 gm at 36 weeks.  Twin B is 2236 gm at 36  weeks.  Fetal heart  rates of both babies have been within normal limits.  BPD on February 23, 2004  was 8 out of 8 for both babies.  CERVICAL EXAM:  Digital exam of the cervix on last cervical exam found it  long and closed.  EXTREMITIES:  Show no pathologic edema.  Deep tendon reflexes are 1+ with no  clonus.   ASSESSMENT:  Intrauterine pregnancy at 38 weeks, twin gestation  Elective primary low transverse cesarean section.   PLAN:  Admit for Dr. Dierdre Forth with orders as written.     Rica Koyanagi, C.N.M.               Hal Morales, M.D.    SDM/MEDQ  D:  03/01/2004  T:  03/01/2004  Job:  [id number]

## 2011-01-19 NOTE — H&P (Signed)
NAMECOLENA, KETTERMAN              ACCOUNT NO.:  [id number]   MEDICAL RECORD NO.:  [id number]          PATIENT TYPE:  AMB   LOCATION:                                [facility]   PHYSICIAN:  Dois Davenport A. Rivard, M.D. DATE OF BIRTH:  05-Jan-1981   DATE OF ADMISSION:  12/28/2005  DATE OF DISCHARGE:  12/28/2005                                HISTORY & PHYSICAL   This is a 30 year old gravida 4, para 1-0-2-2 who is 8-1/[redacted] weeks gestation  by dates with a missed abortion.  She presents for elective D&C as treatment  for that.  Options of observation versus medical management versus surgical  management have been reviewed with the patient and she elects to proceed  with surgical management.  She is essentially healthy and has no allergies.  Her blood type is B positive, no other prenatal labs are available.   ALLERGIES:  NO KNOWN DRUG ALLERGIES.   OBSTETRIC HISTORY:  An elective abortion in 2000 with no complications.  She  had a first trimester spontaneous abortion in 2002 and she had a cesarean  delivery in 2005 of a twin gestation for viable female infants at 41 weeks,  who did well.   PAST MEDICAL HISTORY:  1.  Anemia.  2.  History of scoliosis and removal of wisdom teeth in the year 2000.   PAST SURGICAL HISTORY:  1.  Wisdom teeth in 2000.  2.  Elective abortion in 2000.  3.  Cesarean section in 2005.   FAMILY HISTORY:  Remarkable for patient's father with history of heart  disease and hypertension.  Her mother has a history of thalassemia and  thyroid disease.  Father has kidney disease on dialysis.  Maternal  grandmother has breast cancer and maternal grandfather has bone cancer.   GENETIC HISTORY:  Remarkable for father of the babies with sickle cell trait  and patient's mother with thalassemia.   SOCIAL HISTORY:  Patient is single.  Father of her previous babies is  involved and supportive.  She does not report a religious affiliation.  She  denies alcohol, tobacco or  drug use.   OBJECTIVE:  VITAL SIGNS:  Stable.  Afebrile.  HEENT:  Within normal limits.  NECK:  Thyroid normal and not enlarged.  CHEST:  Clear to auscultation.  CARDIOVASCULAR:  Regular rate and rhythm.  ABDOMEN:  Soft and nontender.  PELVIC:  Remarkable for a small amount of vaginal bleeding.  Cervix is long  and closed.  Uterus is six weeks' size, nontender.  Adnexa unremarkable.  RECTAL:  Deferred.  EXTREMITIES:  Within normal limits.   Blood type B+.  Ultrasound on December 27, 2005, shows a gestational sac  measuring six weeks with no fetal pole and no yolk sac.  Subchorionic  hemorrhage has increased in size and is now 2.67 cm x 0.67 cm.  There is  also a left corpus luteum cyst.   ASSESSMENT:  1.  Intrauterine pregnancy at 8 weeks.  2.  Missed abortion at six weeks.  3.  Desires elective dilatation and curettage.   PLAN:  1.  Admit  to operating suite for [facility] and further orders to follow.      Marie L. Williams, C.N.M.      [name], M.D.  Electronically Signed    MLW/MEDQ  D:  12/27/2005  T:  12/27/2005  Job:  [id number]

## 2011-01-19 NOTE — Discharge Summary (Signed)
Krista [name], Krista [name]                        ACCOUNT NO.:  [id number]   MEDICAL RECORD NO.:  [id number]                   PATIENT TYPE:  INP   LOCATION:  9117                                 [facility]   PHYSICIAN:  Osborn Coho, M.D.                DATE OF BIRTH:  1981/02/09   DATE OF ADMISSION:  03/01/2004  DATE OF DISCHARGE:  03/04/2004                                 DISCHARGE SUMMARY   ADMISSION DIAGNOSES:  1. Intrauterine pregnancy at term with twin gestation.  2. Malposition of twin B.  3. Desires primary low transverse cesarean section.   DISCHARGE DIAGNOSES:  1. Intrauterine pregnancy at term with twin gestation.  2. Malposition of twin B.  3. Desires primary low transverse cesarean section.  4. Status post primary low transverse cesarean section.  5. Breast feeding.  6. Desires Micronor for contraception.   PROCEDURE:  Primary low transverse cesarean section for delivery of 2 viable  twin females on March 01, 2004, attended in delivery by Dr. Dierdre Forth  and Mack Guise, C.N.M.   HOSPITAL COURSE:  Ms. Krista [name] is a 30 year old black female gravida 3, para  0-0-2-0 with an estimated date of confinement of March 13, 2004 admitted for  cesarean section delivery of twin gestation. Twin A was in vertex  presentation with low normal fluid and twin B was in breech presentation  with normal amount of fluid. She underwent the same for delivery and  delivered a viable female named Krista [name] who weighed 5 pounds 14 ounces with  Apgar's of 9 and 9 and twin B is a viable female infant who weighed 5 pounds  13 ounces with Apgar's of 9 and 9 and who's name is Krista [name]. Please see  operative note for further details. Postoperatively, the patient has done  well. She is ambulating, voiding and eating without difficulty. Her vital  signs have been stable and she has been afebrile throughout her  postoperative state. She is deemed ready for discharge today. She desires  Micronor  for contraception. Her incision is healing well and is deemed that  the staples may be removed today and she will be home with Steri-strips.   DISCHARGE INSTRUCTIONS:  As per the [facility] and  Gynecologic Services handout.   DISCHARGE MEDICATIONS:  1. Motrin 600 mg p.o. q. 6 hours p.r.n. for pain.  2. Tylox 1 to 2 p.o. q. 4 to 6 hours p.r.n. for pain.  3. Prenatal vitamins daily.  4. Micronor 1 p.o. q.d. to start in about 3 weeks.   LABORATORY DATA:  On discharge hemoglobin was 8.9. WBC count is 10.5 and her  platelets are 183,000.   FOLLOW UP:  In 6 weeks at [facility] and Gynecologic  Services or p.r.n.   CONDITION ON DISCHARGE:  Well and stable.     Concha Pyo. Duplantis, C.N.M.  Osborn Coho, M.D.    SJD/MEDQ  D:  03/04/2004  T:  03/05/2004  Job:  [id number]   cc:   Rica Koyanagi, C.N.M.  [address]  [place]  [place] [address]  Fax: [phone]

## 2011-05-21 ENCOUNTER — Encounter: Payer: Self-pay | Admitting: Internal Medicine

## 2011-05-21 ENCOUNTER — Inpatient Hospital Stay (HOSPITAL_COMMUNITY)
Admission: EM | Admit: 2011-05-21 | Discharge: 2011-05-23 | DRG: 297 | Disposition: A | Discharge status: Home / Self Care | Payer: BC Managed Care – PPO | Attending: Internal Medicine | Admitting: Internal Medicine

## 2011-05-21 DIAGNOSIS — Z91199 Patient's noncompliance with other medical treatment and regimen due to unspecified reason: Secondary | ICD-10-CM

## 2011-05-21 DIAGNOSIS — I10 Essential (primary) hypertension: Secondary | ICD-10-CM | POA: Diagnosis present

## 2011-05-21 DIAGNOSIS — E876 Hypokalemia: Secondary | ICD-10-CM

## 2011-05-21 DIAGNOSIS — Z79899 Other long term (current) drug therapy: Secondary | ICD-10-CM

## 2011-05-21 DIAGNOSIS — E269 Hyperaldosteronism, unspecified: Secondary | ICD-10-CM | POA: Diagnosis present

## 2011-05-21 DIAGNOSIS — Z9119 Patient's noncompliance with other medical treatment and regimen: Secondary | ICD-10-CM

## 2011-05-21 DIAGNOSIS — Z882 Allergy status to sulfonamides status: Secondary | ICD-10-CM

## 2011-05-21 LAB — POCT I-STAT, CHEM 8
BUN: 9 mg/dL (ref 6–23)
Calcium, Ion: 1.12 mmol/L (ref 1.12–1.32)
Chloride: 97 mEq/L (ref 96–112)
Glucose, Bld: 94 mg/dL (ref 70–99)

## 2011-05-21 NOTE — H&P (Signed)
Hospital Admission Note Date: 05/21/2011  Patient name:  Krista [name]   Medical record number:  [id number] Date of birth:  [birth year]  Age: 30 y.o. Gender:  female PCP:    No PCP, Endocrinologist: Dr. Sharl Ma  Medical Service:   Internal Medicine Teaching Service   Attending physician:  Dr. Phillips Odor First Contact:   MS-IV Orlene Plum Pager: [phone] Second Contact:   Dr. Carrolyn Meiers Pager: [phone]  After Hours:    First Contact   Pager: [phone]      Second Contact  Pager: [phone]  Chief Complaint: tingling/cramping hands, legs, feet, mouth  History of Present Illness: Pt is a 30 year-old female with 3-year history of hyperaldosteronism of unknown etiology and hypertension who presents with symptomatic hypokalemia, including tingling of legs, feet, mouth, and cramping of hands.  Pt states symptoms began last night.  Pt had a similar episode two years ago, which required potassium repletion.  Pt endorses associated intermittent central chest pain, described as sharp, non-radiating pain for a few seconds per episode, worsened when laying down. Pt denies diaphoresis, SOB, diarrhea, constipation, abdominal pain, headache, or dizziness. Pt is unaware of any exacerbating factors and has not had any dietary or lifestyle changes recently.    Current Outpatient Medications: Spironolactone, 100 mg PO TID  Allergies: Sulfa  Past Medical History: - Hyperaldosteronism, unknown etiology - Hypertension, 2/2 hyperaldosteronism - G3P3  Past Surgical History: - C-section x 2 (last was 1 year ago) - Breast reduction surgery (2006)  Family History: - HTN, renal failure - Father (suspected may have similar etiology to patient's current endocrinopathy) - MI - Father (deceased, age 61) - Guillian-Barre Syndrome - Mother  Social History: - Employed as bus Hospital doctor - Has three children - Denies tobacco, alcohol, drugs   Review of Systems: Pertinent items are noted in HPI.  Vital Signs: T: 98.4 P:  82 BP: 190/95 RR: 16 O2 sat: 98% RA   Physical Exam: General: Vital signs reviewed and noted. Well-developed, well-nourished, in no acute distress; alert, appropriate and cooperative throughout examination.  Head: Normocephalic, atraumatic.  Eyes: PERRL, EOMI, No signs of anemia or jaundince.  Nose: Mucous membranes moist, not inflammed, nonerythematous.  Throat: Oropharynx nonerythematous, no exudate appreciated.   Neck: No deformities, masses, or tenderness noted.Supple, No carotid Bruits, no JVD.  Lungs:  Normal respiratory effort. Clear to auscultation BL without crackles or wheezes.  Heart: RRR. S1 and S2 normal without gallop, murmur, or rubs.  Abdomen:  BS normoactive. Soft, Nondistended, non-tender.  No masses or organomegaly.  Extremities: No pretibial edema.  Neurologic: A&O X3, CN II - XII are grossly intact. Motor strength is 5/5 in the all 4 extremities, Sensations intact to light touch, Cerebellar signs negative.  Skin: No visible rashes, scars.   Lab results:   I-STAT-Comment                           SEE NOTE.    NOTIFIED PHYSICIAN  TCO2                                     33                0-100            mmol/L  Ionized Calcium  1.12              1.12-1.32        mmol/L  Hemoglobin (HGB)                         12.9              12.0-15.0        g/dL  Hematocrit (HCT)                         38.0              36.0-46.0        %  Sodium (NA)                              143               135-145          mEq/L  Potassium (K)                            2.2        L      3.5-5.1          mEq/L  Chloride                                 97                96-112           mEq/L  Glucose                                  94                70-99            mg/dL  BUN                                      9                 6-23             mg/dL  Creatinine                               0.90              0.50-1.10        mg/dL   Imaging results:   None  Assessment & Plan: 1. Numbness/tingling/cramping- likely 2/2 hypokalemia with K+ of 2.2.  Patient does have a history of hyperaldosteronism which is being managed by endocrinology-Dr. Sharl Ma.  EKG did not show any acute ST or T waves changes; although she does have some T waves flattening which can also be seen on previous EKG.   -Will admit to telemetry -Replete KCl 10 mEq IV x 4 runs, then repeat BMET.  If still low, will continue to replete -Check Mg level -Restart spironolactone 100mg  po tid - home dose -Repeat BMET in AM -Repeat 12 leads-EKG in AM  2. HTN- 2/2 hyperaldosteronism.  Not well-controlled. -Resume spinolactone 100mg  po tid -Give Hydralazine 10mg  IV if SBP >190  DVT PPX - Lovenox 40mg  sq daily     Carrolyn Meiers, M.D. (PGY2):  ____________________________________    Date/ Time:    ____________________________________     Orlene Plum, MS-IV:   ____________________________________    Date/ Time:    ____________________________________     I have seen and examined the patient. I reviewed the resident/fellow note and agree with the findings and plan of care as documented. My additions and revisions are included.   Signature:  ____________________________________________     Internal Medicine Teaching Service Attending    Date:    ____________________________________________

## 2011-05-22 ENCOUNTER — Inpatient Hospital Stay (HOSPITAL_COMMUNITY): Payer: BC Managed Care – PPO

## 2011-05-22 DIAGNOSIS — E876 Hypokalemia: Secondary | ICD-10-CM

## 2011-05-22 DIAGNOSIS — I10 Essential (primary) hypertension: Secondary | ICD-10-CM

## 2011-05-22 LAB — BASIC METABOLIC PANEL
BUN: 7 mg/dL (ref 6–23)
BUN: 8 mg/dL (ref 6–23)
CO2: 30 mEq/L (ref 19–32)
Calcium: 8.3 mg/dL — ABNORMAL LOW (ref 8.4–10.5)
Calcium: 8.7 mg/dL (ref 8.4–10.5)
Chloride: 103 mEq/L (ref 96–112)
Creatinine, Ser: 0.57 mg/dL (ref 0.50–1.10)
Creatinine, Ser: 0.76 mg/dL (ref 0.50–1.10)
GFR calc Af Amer: >60 mL/min (ref >60)
GFR calc non Af Amer: >60 mL/min (ref >60)
Glucose, Bld: 100 mg/dL — ABNORMAL HIGH (ref 70–99)
Glucose, Bld: 134 mg/dL — ABNORMAL HIGH (ref 70–99)
Potassium: 2.5 mEq/L — CL (ref 3.5–5.1)

## 2011-05-22 LAB — CBC
Hemoglobin: 10.5 g/dL — ABNORMAL LOW (ref 12.0–15.0)
MCH: 20.1 pg — ABNORMAL LOW (ref 26.0–34.0)
MCV: 63.9 fL — ABNORMAL LOW (ref 78.0–100.0)
RBC: 5.23 MIL/uL — ABNORMAL HIGH (ref 3.87–5.11)

## 2011-05-23 LAB — MAGNESIUM: Magnesium: 2.1 mg/dL (ref 1.5–2.5)

## 2011-05-23 LAB — BASIC METABOLIC PANEL
BUN: 9 mg/dL (ref 6–23)
CO2: 27 mEq/L (ref 19–32)
Calcium: 8.9 mg/dL (ref 8.4–10.5)
Creatinine, Ser: 0.71 mg/dL (ref 0.50–1.10)

## 2011-05-23 NOTE — Discharge Summary (Signed)
Patient was admitted for hypokalemia 2/2 hyperaldosteronism and 2 weeks of noncompliance with her spironolactone.  Pt was discharged on her normal dose spironolactone as well as 5 days of oral K supplementation and additional BP med, norvasc.  - Please confirm patient is still taking her spironolactone and reinforce the importance of medication compliance.  - Please recheck potassium and magnesium levels. Adjust supplementation as needed and arrange for another K check if deemed necessary.  - Please assess if norvasc is going to be adequate med for her, and adjust as needed for HTN control.  - Please inquire if pt has rectified the issues on her account that prevent her from making an appt with her endocrinologist Dr. Sharl Ma. If not, encourage her to do so -- very important for her to be under his care.

## 2011-05-25 ENCOUNTER — Ambulatory Visit: Payer: BC Managed Care – PPO | Admitting: Internal Medicine

## 2011-05-25 LAB — BASIC METABOLIC PANEL
BUN: 6
CO2: 26
Chloride: 100
Creatinine, Ser: 0.58
Glucose, Bld: 96

## 2011-05-25 LAB — CBC
MCHC: 32.5
MCV: 67 — ABNORMAL LOW
Platelets: 399
RDW: 15.3
WBC: 8.3

## 2011-05-28 ENCOUNTER — Encounter: Payer: BC Managed Care – PPO | Admitting: Internal Medicine

## 2011-05-29 LAB — POCT URINALYSIS DIP (DEVICE)
Ketones, ur: NEGATIVE
Operator id: 247071
Protein, ur: NEGATIVE
Urobilinogen, UA: 1

## 2011-05-29 LAB — GC/CHLAMYDIA PROBE AMP, GENITAL: GC Probe Amp, Genital: NEGATIVE

## 2011-05-29 LAB — POCT PREGNANCY, URINE: Operator id: 247071

## 2011-06-12 LAB — RAPID URINE DRUG SCREEN, HOSP PERFORMED
Benzodiazepines: NOT DETECTED
Cocaine: NOT DETECTED
Tetrahydrocannabinol: NOT DETECTED

## 2011-06-12 LAB — POCT PREGNANCY, URINE: Operator id: 239701

## 2011-06-12 LAB — I-STAT 8, (EC8 V) (CONVERTED LAB)
Bicarbonate: 29.8 — ABNORMAL HIGH
HCT: 41
Hemoglobin: 13.9
Operator id: 239701
Sodium: 139
TCO2: 31

## 2011-06-12 LAB — D-DIMER, QUANTITATIVE: D-Dimer, Quant: 0.27

## 2011-06-21 NOTE — Discharge Summary (Signed)
Krista [name], KRIKORIAN              ACCOUNT NO.:  [id number]  MEDICAL RECORD NO.:  [id number]  LOCATION:  4710                         FACILITY:  [facility]  PHYSICIAN:  Edsel Petrin, D.O.DATE OF BIRTH:  04/24/1981  DATE OF ADMISSION:  05/21/2011 DATE OF DISCHARGE:  05/23/2011                              DISCHARGE SUMMARY   DISCHARGE DIAGNOSES: 1. Hypokalemia secondary to hyperaldosteronism and medication     noncompliance. 2. Hypertension secondary to hyperaldosteronism. 3. Hyperaldosteronism.  DISCHARGE MEDICATIONS: 1. Spironolactone 100 mg by mouth three times a day. 2. Potassium chloride 40 mEq by mouth daily, take for 5 days. 3. Norvasc/amlodipine 5 mg by mouth daily.  DISPOSITION AND FOLLOWUP:  Krista [name] will followup on Friday September 21 at 11:15 a.m. with Dr. Allena Katz at the [facility].  At this time, please assess for resolution of her symptoms including numbness, tingling, and cramping of her hands, feet, legs, and mouth. Please also check a potassium and magnesium level and provide further potassium and magnesium supplementation as necessary.  We have started the [name] on Norvasc for improved blood pressure control given that she still has baseline hypertension.  Please assess her for side effects from this medications and make any adjustments as necessary.  The [name] will also follow up with Dr. Talmage Coin of  [facility], Endocrinology.  We were unable to make a followup appointment for the [name] since her account is on hold due to the [name] not showing up for several appointments and having a balance on her account.  The [name] was provided with the phone number to call [facility] billing and resolve these matters.  The [name] will make a followup appointment on her own.  PROCEDURES PERFORMED:  PICC line attempted unsuccessful x3 attempt.  Krista HISTORY AND PHYSICAL:  The [name] is a 30 year old female with a  3-year history of hyperaldosteronism of unknown etiology and hypertension, who presents with symptomatic hypokalemia including tinglings of legs, feet, mouth, and cramping of hands.  The [name] states symptoms began last night.  The [name] had a similar episode 2 years ago, which required potassium repletion.  The [name] endorses associated intermittent central chest pain described as sharp, nonradiating pain that last for few seconds per episode, worsened when laying down.  The [name] denies diaphoresis, shortness of breath, diarrhea, constipation, abdominal pain, headache, or dizziness.  The [name] is unaware of any exacerbating factors and has not had any dietary or lifestyle changes recently.  PAST MEDICAL HISTORY:  Hyperaldosteronism, hypertension secondary to hyperaldosteronism, C-section x2, breast reduction surgery in 2006.  ALLERGIES:  SULFA.  PHYSICAL EXAMINATION:  VITAL SIGNS:  Krista vitals, temperature 98.4, pulse 82, blood pressure 190/95, respiratory rate 16, O2 sat 98% on room air. GENERAL:  Well developed, well nourished in no acute distress.  Alert, appropriate, and cooperative throughout examination.  Head, normocephalic, atraumatic.  Eyes, PERRLA, EOMI.  No signs of anemia or jaundice.  Nose, mucous membranes moist, noninflamed, nonerythematous. Throat, oropharynx, nonerythematous, no exudate appreciated. NECK:  No deformities, masses, or tenderness noted.  Supple.  No carotid bruits.  No JVD. LUNGS:  Normal respiratory effort.  Clear to auscultation bilaterally without crackles or wheezes. HEART:  Regular rate and rhythm.  S1 and S2 normal without gallop, murmur, or rub. ABDOMEN:  Bowel sounds normoactive.  Soft, nondistended, nontender.  No masses or organomegaly. EXTREMITIES:  No pretibial edema. NEUROLOGIC:  Alert and oriented x3.  Cranial nerves II through XII are grossly intact.  Motor strength is 5/5 in all 4 extremities.  Sensation is intact to  light touch.  Cerebellar signs negative. SKIN:  No visible rashes or scars.  Krista LABS:  Sodium 143, potassium 2.2, chloride 97, bicarb 33, BUN 9, creatinine 0.9, glucose 94.  Hemoglobin 12.9, hematocrit 38.0.  Krista EKG:  Normal sinus rhythm.  Ventricular rate of 79.  Normal axis.  No bundle branch block.  Notable for presence of U-waves.  Repeat EKG 24 hours later after initial potassium repletion EKG showed less prominent U-waves, but U-waves still present.  HOSPITAL COURSE: 1. Hypokalemia.  The [name] presented with a potassium of 2.2 and was     symptomatic.  The [name] received IV potassium chloride overnight,     but burning required a decreased flow rate, which limited repletion     to only 30 mEq.  The [name] was completely asymptomatic after this     overnight potassium.  In the a.m., repeat BMET showed potassium     still 2.2.  Over hospital course, the [name] received a total of     430 mEq of potassium which included 7 doses of 10 mEq IV and 9     doses of 40 mEq p.o.  The [name]'s final potassium upon discharge     was 3.9 and was confirmed negative for hemolysis.  The [name] also     had a low-normal magnesium, so was given 2 mg of IV magnesium with     a final magnesium of 2.1 upon discharge.  At discharge, the [name]     had no cramping, numbing, or tingling and felt completely well.     The [name] was discharged with oral potassium supplementation of     40 mEq daily to take until her followup appointment. 2. Hypertension.  The [name] has baseline hypertension and is only on     spironolactone.  During admission, her blood pressure fluctuated     with periods of systolic blood pressure greater than 170 and     diastolic blood pressure up to 161 that resolved after a brief time     without medical intervention.  The [name]'s blood pressure was     monitored closely.  The [name] was given 1 dose of hydralazine for     elevated blood pressure  initially upon admission.  The [name] was     discharged on Norvasc 5 mg daily for additional hypertension     control. 3. Hyperaldosteronism.  The [name]'s hyperaldosteronism was managed     by treating her hypokalemia and getting the [name] back on     spironolactone 100 mg t.i.d.  The [name] was discharged with a     prescription for spironolactone and encouraged to follow up with     her endocrinologist Dr. Sharl Ma.  We were unable to setup this     followup appointment due to issues with the [name]'s account     status that require her to call and resolve.  She was provided the     phone number and instructions on how to resolve her account issues.     In the meantime, the [name] had a followup appointment made in the  outpatient clinic on Friday to have her BMET rechecked and any     issues treated. 4. Medication noncompliance.  The [name] eventually admitted to     noncompliance with her spironolactone, having not taken it for 2     weeks.  The [name] also expressed difficulty in taking the     medication 3 times a day indefinitely.  We discussed with the     [name] the importance of taking her medication so that she does     not become hypokalemic and discussed that her symptoms could be     more serious and even life-threatening if her potassium got even     lower.  The [name] expressed interest in being compliant rather     than risking in other hospitalization.  DISCHARGE VITAL SIGNS:  Temperature 98.5, pulse 76, blood pressure 156/94, respiratory rate 18, O2 sat 100% on room air.  DISCHARGE LABS:  Sodium 141, potassium 3.9, chloride 107, CO2 27, BUN 9, creatinine 0.71, glucose 108, magnesium 2.1.  The [name]'s condition was improved and the [name] was stable upon discharge.    ______________________________ Carrolyn Meiers, MD   ______________________________ Edsel Petrin, D.O.    MH/MEDQ  D:  05/23/2011  T:  05/23/2011  Job:  [id number]  cc:   Dr.  Elmo Putt, M.D.  Electronically Signed by Carrolyn Meiers MD on 06/12/2011 06:45:18 PM Electronically Signed by Anderson Malta D.O. on 06/21/2011 12:18:51 PM

## 2011-09-07 ENCOUNTER — Inpatient Hospital Stay (HOSPITAL_COMMUNITY)
Admission: AD | Admit: 2011-09-07 | Discharge: 2011-09-07 | Disposition: A | Discharge status: Home / Self Care | Payer: BC Managed Care – PPO | Source: Ambulatory Visit | Attending: Obstetrics and Gynecology | Admitting: Obstetrics and Gynecology

## 2011-09-07 ENCOUNTER — Encounter (HOSPITAL_COMMUNITY): Payer: Self-pay | Admitting: *Deleted

## 2011-09-07 DIAGNOSIS — O10019 Pre-existing essential hypertension complicating pregnancy, unspecified trimester: Secondary | ICD-10-CM | POA: Insufficient documentation

## 2011-09-07 DIAGNOSIS — O10919 Unspecified pre-existing hypertension complicating pregnancy, unspecified trimester: Secondary | ICD-10-CM

## 2011-09-07 DIAGNOSIS — O2690 Pregnancy related conditions, unspecified, unspecified trimester: Secondary | ICD-10-CM

## 2011-09-07 DIAGNOSIS — O99891 Other specified diseases and conditions complicating pregnancy: Secondary | ICD-10-CM | POA: Insufficient documentation

## 2011-09-07 DIAGNOSIS — R209 Unspecified disturbances of skin sensation: Secondary | ICD-10-CM | POA: Insufficient documentation

## 2011-09-07 DIAGNOSIS — Z98891 History of uterine scar from previous surgery: Secondary | ICD-10-CM

## 2011-09-07 DIAGNOSIS — E269 Hyperaldosteronism, unspecified: Secondary | ICD-10-CM | POA: Insufficient documentation

## 2011-09-07 HISTORY — DX: Hypokalemia: E87.6

## 2011-09-07 LAB — COMPREHENSIVE METABOLIC PANEL
ALT: 11 U/L (ref 0–35)
Calcium: 8.7 mg/dL (ref 8.4–10.5)
GFR calc Af Amer: >90 mL/min (ref >90)
Glucose, Bld: 129 mg/dL — ABNORMAL HIGH (ref 70–99)
Sodium: 135 mEq/L (ref 135–145)
Total Protein: 7.1 g/dL (ref 6.0–8.3)

## 2011-09-07 LAB — DIFFERENTIAL
Basophils Relative: 0 % (ref 0–1)
Lymphocytes Relative: 15 % (ref 12–46)
Lymphs Abs: 1.7 10*3/uL (ref 0.7–4.0)
Monocytes Absolute: 0.6 10*3/uL (ref 0.1–1.0)
Monocytes Relative: 5 % (ref 3–12)
Neutro Abs: 9.2 10*3/uL — ABNORMAL HIGH (ref 1.7–7.7)

## 2011-09-07 LAB — CBC
HCT: 33.7 % — ABNORMAL LOW (ref 36.0–46.0)
Hemoglobin: 10.7 g/dL — ABNORMAL LOW (ref 12.0–15.0)
MCHC: 31.8 g/dL (ref 30.0–36.0)
RBC: 5.14 MIL/uL — ABNORMAL HIGH (ref 3.87–5.11)

## 2011-09-07 MED ORDER — ONDANSETRON 8 MG PO TBDP: 8.0000 mg | ORAL_TABLET | Freq: Three times a day (TID) | ORAL | Status: AC | PRN

## 2011-09-07 MED ORDER — LABETALOL HCL 100 MG PO TABS
200.0000 mg | ORAL_TABLET | Freq: Once | ORAL | Status: AC
  Administered 2011-09-07: 200 mg via ORAL
  Filled 2011-09-07: qty 2

## 2011-09-07 MED ORDER — ONDANSETRON 8 MG PO TBDP
8.0000 mg | ORAL_TABLET | Freq: Once | ORAL | Status: AC
  Administered 2011-09-07: 8 mg via ORAL
  Filled 2011-09-07: qty 1

## 2011-09-07 MED ORDER — POTASSIUM CHLORIDE CRYS ER 10 MEQ PO TBCR
10.0000 meq | EXTENDED_RELEASE_TABLET | Freq: Once | ORAL | Status: AC
  Administered 2011-09-07: 10 meq via ORAL
  Filled 2011-09-07: qty 1

## 2011-09-07 MED ORDER — LABETALOL HCL 100 MG PO TABS: 200.0000 mg | ORAL_TABLET | Freq: Two times a day (BID) | ORAL | Status: DC

## 2011-09-07 MED ORDER — POTASSIUM CHLORIDE ER 10 MEQ PO TBCR: 10.0000 meq | EXTENDED_RELEASE_TABLET | Freq: Four times a day (QID) | ORAL | Status: DC

## 2011-09-07 NOTE — Progress Notes (Signed)
Pt usually takes meds for hypertension and low K, not taking meds due to preg, was at office this week and had normal manual BP, feels tingling in hands and lips and usually gets this with low K,  Last episode was Sept, K was 2.7 and IP at [facility].  BP 188/110, Corrie Dandy CNM here on unit and reported.

## 2011-09-07 NOTE — Progress Notes (Signed)
Hilary cnm notified that we have paged Dr. Lucianne Muss the [facility] endocrinologist on call and gave him Dr. Lilian Coma phone number.

## 2011-09-07 NOTE — Progress Notes (Signed)
Lab tech called with critical potassium level of 2.74meq. Denny Levy cnm notified

## 2011-09-07 NOTE — ED Provider Notes (Addendum)
History     Chief Complaint  Patient presents with  . Tingling   HPI Pt with know hyperaldosteronism and resultant chronic hypertension presents after discontinuing her spironolactone one month ago, and experiencing "tingling in her fingers".  She remembers this being associated with low potassium in the past.    OB History    Grav Para Term Preterm Abortions TAB SAB Ect Mult Living   4 2 2  1  1  1 3       Past Medical History  Diagnosis Date  . Hypokalemia 2007    IP Sept 2012    Past Surgical History  Procedure Date  . Breast reduction surgery Jan 2006  . Cesarean section 2005 2011  . Dilation and curettage of uterus 2003    No family history on file.  History  Substance Use Topics  . Smoking status: Not on file  . Smokeless tobacco: Not on file  . Alcohol Use: Not on file    Allergies:  Allergies  Allergen Reactions  . Sulfa Antibiotics Other (See Comments)    Eyes turn blood shot red    Prescriptions prior to admission  Medication Sig Dispense Refill  . Prenatal Vit-Fe Fumarate-FA (PRENATAL MULTIVITAMIN) TABS Take 1 tablet by mouth daily.        Marland Kitchen spironolactone (ALDACTONE) 100 MG tablet Take 100 mg by mouth 3 (three) times daily.  PT HAS NOT TAKEN THIS MEDICATION IN 4 WEEKS        Review of Systems  Constitutional: Negative.   HENT: Negative for nosebleeds.   Eyes: Negative for blurred vision, double vision, photophobia and pain.  Cardiovascular: Negative for chest pain, palpitations and orthopnea.  Gastrointestinal: Positive for nausea. Negative for vomiting, abdominal pain, diarrhea and constipation.  Genitourinary: Negative for dysuria.  Skin: Negative.   Neurological: Positive for tingling. Negative for dizziness and headaches.   Physical Exam   Blood pressure 145/75, pulse 84, temperature 98.8 F (37.1 C), resp. rate 20, SpO2 100.00%.  Physical Exam  Constitutional: She is oriented to person, place, and time. She appears well-developed and  well-nourished.  HENT:  Head: Normocephalic and atraumatic.  Neck: Normal range of motion. Neck supple.  Cardiovascular: Normal rate, regular rhythm and normal heart sounds.   Respiratory: Effort normal and breath sounds normal. No respiratory distress. She has no wheezes. She has no rales.  GI: Soft. She exhibits no distension. There is no tenderness.  Neurological: She is alert and oriented to person, place, and time. She has normal reflexes.  Skin: Skin is warm and dry.  Psychiatric: She has a normal mood and affect.    MAU Course  Procedures Significant lab is potassium of 2.7  Blood pressure improved from level of 180/110 on admission to 145/75 after labetalol 200mg  po Patient tolerated po potassium   Assessment and Plan  1) Begin labetalol 200mg  po bid, Kdur 10 meq po 4 times daily, Zofran 8 mg q 8h prn. 2) Discussed patient with Dr. Sharl Ma who will see her on Monday 09-10-11 at 2pm to assess for treatment of hyperaldosteronism in pregnancy, and to recheck her potassium 3) Patient to keep followup appt at [facility] on 10/01/11  Lizzete Gough P 09/07/2011, 10:46 PM

## 2011-12-03 ENCOUNTER — Observation Stay (HOSPITAL_COMMUNITY)
Admission: EM | Admit: 2011-12-03 | Discharge: 2011-12-05 | Disposition: A | Discharge status: Home / Self Care | Payer: BC Managed Care – PPO | Attending: Internal Medicine | Admitting: Internal Medicine

## 2011-12-03 ENCOUNTER — Emergency Department (HOSPITAL_COMMUNITY): Payer: BC Managed Care – PPO

## 2011-12-03 ENCOUNTER — Encounter (HOSPITAL_COMMUNITY): Payer: Self-pay

## 2011-12-03 ENCOUNTER — Other Ambulatory Visit: Payer: Self-pay

## 2011-12-03 DIAGNOSIS — D72829 Elevated white blood cell count, unspecified: Secondary | ICD-10-CM | POA: Insufficient documentation

## 2011-12-03 DIAGNOSIS — E876 Hypokalemia: Secondary | ICD-10-CM | POA: Insufficient documentation

## 2011-12-03 DIAGNOSIS — R509 Fever, unspecified: Secondary | ICD-10-CM | POA: Diagnosis present

## 2011-12-03 DIAGNOSIS — E269 Hyperaldosteronism, unspecified: Principal | ICD-10-CM | POA: Insufficient documentation

## 2011-12-03 DIAGNOSIS — I10 Essential (primary) hypertension: Secondary | ICD-10-CM | POA: Diagnosis present

## 2011-12-03 HISTORY — DX: Other primary hyperaldosteronism: E26.09

## 2011-12-03 HISTORY — DX: Scoliosis, unspecified: M41.9

## 2011-12-03 HISTORY — DX: Thalassemia minor: D56.3

## 2011-12-03 HISTORY — DX: Essential (primary) hypertension: I10

## 2011-12-03 HISTORY — DX: Migraine, unspecified, not intractable, without status migrainosus: G43.909

## 2011-12-03 LAB — POCT I-STAT, CHEM 8
BUN: 8 mg/dL (ref 6–23)
Calcium, Ion: 1.03 mmol/L — ABNORMAL LOW (ref 1.12–1.32)
Chloride: 99 meq/L (ref 96–112)
Creatinine, Ser: 0.8 mg/dL (ref 0.50–1.10)
Glucose, Bld: 112 mg/dL — ABNORMAL HIGH (ref 70–99)
HCT: 41 % (ref 36.0–46.0)
Hemoglobin: 13.9 g/dL (ref 12.0–15.0)
Potassium: 2.3 meq/L — CL (ref 3.5–5.1)
Sodium: 141 meq/L (ref 135–145)
TCO2: 29 mmol/L (ref 0–100)

## 2011-12-03 LAB — POTASSIUM: Potassium: 2.4 mEq/L — CL (ref 3.5–5.1)

## 2011-12-03 LAB — CBC
HCT: 37.1 % (ref 36.0–46.0)
Hemoglobin: 11.4 g/dL — ABNORMAL LOW (ref 12.0–15.0)
MCH: 20.3 pg — ABNORMAL LOW (ref 26.0–34.0)
MCHC: 30.7 g/dL (ref 30.0–36.0)
MCV: 66 fL — ABNORMAL LOW (ref 78.0–100.0)
Platelets: 337 10*3/uL (ref 150–400)
RBC: 5.62 MIL/uL — ABNORMAL HIGH (ref 3.87–5.11)
RDW: 15.2 % (ref 11.5–15.5)
WBC: 15.6 10*3/uL — ABNORMAL HIGH (ref 4.0–10.5)

## 2011-12-03 LAB — URINALYSIS, ROUTINE W REFLEX MICROSCOPIC
Glucose, UA: NEGATIVE mg/dL
Ketones, ur: NEGATIVE mg/dL
Nitrite: NEGATIVE
Protein, ur: NEGATIVE mg/dL
Urobilinogen, UA: 1 mg/dL (ref 0.0–1.0)

## 2011-12-03 LAB — MAGNESIUM: Magnesium: 1.6 mg/dL (ref 1.5–2.5)

## 2011-12-03 LAB — URINE MICROSCOPIC-ADD ON

## 2011-12-03 MED ORDER — HYDROMORPHONE HCL PF 1 MG/ML IJ SOLN: 0.5000 mg | INTRAMUSCULAR | Status: DC | PRN

## 2011-12-03 MED ORDER — AMLODIPINE BESYLATE 10 MG PO TABS
10.0000 mg | ORAL_TABLET | Freq: Every day | ORAL | Status: DC
  Administered 2011-12-04 – 2011-12-05 (×2): 10 mg via ORAL
  Filled 2011-12-03 (×3): qty 1

## 2011-12-03 MED ORDER — ACETAMINOPHEN 325 MG PO TABS
ORAL_TABLET | ORAL | Status: AC
  Filled 2011-12-03: qty 2

## 2011-12-03 MED ORDER — POTASSIUM CHLORIDE 10 MEQ/100ML IV SOLN
10.0000 meq | INTRAVENOUS | Status: AC
  Administered 2011-12-04 (×3): 10 meq via INTRAVENOUS
  Filled 2011-12-03 (×3): qty 100

## 2011-12-03 MED ORDER — ONDANSETRON HCL 4 MG PO TABS: 4.0000 mg | ORAL_TABLET | Freq: Four times a day (QID) | ORAL | Status: DC | PRN

## 2011-12-03 MED ORDER — ENOXAPARIN SODIUM 40 MG/0.4ML ~~LOC~~ SOLN
40.0000 mg | SUBCUTANEOUS | Status: DC
  Administered 2011-12-04: 40 mg via SUBCUTANEOUS
  Filled 2011-12-03 (×3): qty 0.4

## 2011-12-03 MED ORDER — OXYCODONE HCL 5 MG PO TABS: 5.0000 mg | ORAL_TABLET | ORAL | Status: DC | PRN

## 2011-12-03 MED ORDER — ACETAMINOPHEN 650 MG RE SUPP: 650.0000 mg | Freq: Four times a day (QID) | RECTAL | Status: DC | PRN

## 2011-12-03 MED ORDER — POTASSIUM CHLORIDE 10 MEQ/100ML IV SOLN
10.0000 meq | Freq: Once | INTRAVENOUS | Status: AC
  Administered 2011-12-03: 10 meq via INTRAVENOUS
  Filled 2011-12-03: qty 100

## 2011-12-03 MED ORDER — ACETAMINOPHEN 325 MG PO TABS
650.0000 mg | ORAL_TABLET | Freq: Once | ORAL | Status: AC
  Administered 2011-12-03: 650 mg via ORAL

## 2011-12-03 MED ORDER — SODIUM CHLORIDE 0.9 % IJ SOLN
3.0000 mL | Freq: Two times a day (BID) | INTRAMUSCULAR | Status: DC
  Administered 2011-12-04 – 2011-12-05 (×4): 3 mL via INTRAVENOUS

## 2011-12-03 MED ORDER — ONDANSETRON HCL 4 MG/2ML IJ SOLN: 4.0000 mg | Freq: Four times a day (QID) | INTRAMUSCULAR | Status: DC | PRN

## 2011-12-03 MED ORDER — ACETAMINOPHEN 325 MG PO TABS: 650.0000 mg | ORAL_TABLET | Freq: Four times a day (QID) | ORAL | Status: DC | PRN

## 2011-12-03 MED ORDER — POTASSIUM CHLORIDE CRYS ER 20 MEQ PO TBCR
40.0000 meq | EXTENDED_RELEASE_TABLET | Freq: Two times a day (BID) | ORAL | Status: DC
  Administered 2011-12-03 (×2): 40 meq via ORAL
  Filled 2011-12-03 (×2): qty 2

## 2011-12-03 NOTE — ED Notes (Signed)
Called lab regarding pt's K+ level that was drawn two hours ago. Lab states they messed up and it was skipped resulting in failure of notifying result to pt's chart. Lab reports critical value of 2.4. Dr. Lynelle Doctor notified

## 2011-12-03 NOTE — H&P (Signed)
PCP:   No primary provider on file.  Primary Endocrinologist: Dr Sharl Ma. Chief Complaint:  Tingling paresthesia in hands and feet since last night, now has circumoral tingling in addition.  HPI: [name], with known history of hyperaldosteronism, HTN, s/p LEEP procedure for CIN, s/p reduction mammoplasty 2006, s/p Cesarian section x 2, presenting with above symptoms. This is the typical pattern of her symptoms, when she develops hypokalemia. She therefore, presented to the ED. While in the ED, she was noted to have a temperature of 100.7-102 Farenheit. She denies any symptoms of cough or dysuria, but her 10 month old son has had a temperature for the past 2 days.  Allergies:   Allergies  Allergen Reactions  . Sulfa Antibiotics Other (See Comments)    Eyes turn blood shot red      Past Medical History  Diagnosis Date  . Hypokalemia 2007    IP Sept 2012  . Hypertension     Past Surgical History  Procedure Date  . Breast reduction surgery Jan 2006  . Cesarean section 2005 2011  . Dilation and curettage of uterus 2003    Prior to Admission medications   Medication Sig Start Date End Date Taking? Authorizing Provider  spironolactone (ALDACTONE) 100 MG tablet Take 100 mg by mouth 3 (three) times daily.   Yes Historical Provider, MD    Social History: Single, works as a Midwife, Has 3 offspring does not have a smoking history on file. She does not have any smokeless tobacco history on file. She reports that she does not drink alcohol or use illicit drugs.  Family History:  Father has heart disease and hypertension, mother has anemia.  Review of Systems:  As per HPI and chief complaint. Patent denies fatigue, diminished appetite, weight loss, fever, chills, headache, blurred vision, difficulty in speaking, dysphagia, chest pain, cough, shortness of breath, orthopnea, paroxysmal nocturnal dyspnea, nausea, diaphoresis, abdominal pain, vomiting, diarrhea,  belching, heartburn, hematemesis, melena, dysuria, nocturia, urinary frequency, hematochezia, lower extremity swelling, pain, or redness. The rest of the systems review is negative.  Physical Exam:  General:  Patient does not appear to be in obvious acute distress. Alert, communicative, fully oriented, talking in complete sentences, not short of breath at rest.  HEENT:  No clinical pallor, no jaundice, no conjunctival injection or discharge. NECK:  Supple, JVP not seen, no carotid bruits, no palpable lymphadenopathy, no palpable goiter. Hydration status appears fair. CHEST:  Clinically clear to auscultation, no wheezes, no crackles. HEART:  Sounds 1 and 2 heard, normal, regular, no murmurs. ABDOMEN:  Moderately obese, soft, non-tender, no palpable organomegaly, no palpable masses, normal bowel sounds. GENITALIA:  Not examined. LOWER EXTREMITIES:  No pitting edema, palpable peripheral pulses. MUSCULOSKELETAL SYSTEM:  Unremarkable. CENTRAL NERVOUS SYSTEM:  No focal neurologic deficit on gross examination.  Labs on Admission:  Results for orders placed during the hospital encounter of 12/03/11 (from the past 48 hour(s))  CBC     Status: Abnormal   Collection Time   12/03/11  5:15 PM      Component Value Range Comment   WBC 15.6 (*) 4.0 - 10.5 (K/uL)    RBC 5.62 (*) 3.87 - 5.11 (MIL/uL)    Hemoglobin 11.4 (*) 12.0 - 15.0 (g/dL)    HCT 16.1  09.6 - 04.5 (%)    MCV 66.0 (*) 78.0 - 100.0 (fL)    MCH 20.3 (*) 26.0 - 34.0 (pg)    MCHC 30.7  30.0 -  36.0 (g/dL)    RDW 16.1  09.6 - 04.5 (%)    Platelets 337  150 - 400 (K/uL)   POCT I-STAT, CHEM 8     Status: Abnormal   Collection Time   12/03/11  5:31 PM      Component Value Range Comment   Sodium 141  135 - 145 (mEq/L)    Potassium 2.3 (*) 3.5 - 5.1 (mEq/L)    Chloride 99  96 - 112 (mEq/L)    BUN 8  6 - 23 (mg/dL)    Creatinine, Ser 4.09  0.50 - 1.10 (mg/dL)    Glucose, Bld 811 (*) 70 - 99 (mg/dL)    Calcium, Ion 9.14 (*) 1.12 - 1.32  (mmol/L)    TCO2 29  0 - 100 (mmol/L)    Hemoglobin 13.9  12.0 - 15.0 (g/dL)    HCT 78.2  95.6 - 21.3 (%)   URINALYSIS, ROUTINE W REFLEX MICROSCOPIC     Status: Abnormal   Collection Time   12/03/11  8:11 PM      Component Value Range Comment   Color, Urine YELLOW  YELLOW     APPearance CLEAR  CLEAR     Specific Gravity, Urine 1.011  1.005 - 1.030     pH 7.5  5.0 - 8.0     Glucose, UA NEGATIVE  NEGATIVE (mg/dL)    Hgb urine dipstick MODERATE (*) NEGATIVE     Bilirubin Urine NEGATIVE  NEGATIVE     Ketones, ur NEGATIVE  NEGATIVE (mg/dL)    Protein, ur NEGATIVE  NEGATIVE (mg/dL)    Urobilinogen, UA 1.0  0.0 - 1.0 (mg/dL)    Nitrite NEGATIVE  NEGATIVE     Leukocytes, UA TRACE (*) NEGATIVE    URINE MICROSCOPIC-ADD ON     Status: Normal   Collection Time   12/03/11  8:11 PM      Component Value Range Comment   Squamous Epithelial / LPF RARE  RARE     WBC, UA 0-2  <3 (WBC/hpf)    RBC / HPF 0-2  <3 (RBC/hpf)   POTASSIUM     Status: Abnormal   Collection Time   12/03/11  8:35 PM      Component Value Range Comment   Potassium 2.4 (*) 3.5 - 5.1 (mEq/L)     Radiological Exams on Admission: No results found.  Assessment/Plan Principal Problem:  *Hyperaldosteronism: Patient is under the care of Dr Sharl Ma, endocrinologist, and has remained stable, apart from episodes of recurrent hypokalemia. We shall continue pre-admission medications, and request a consult from Dr Lenox Ponds al, in AM of 12/04/11. Active Problems:  1. Hypokalemia: This is chronic/recurrent, secondary to patient's known history of hyperaldosteronism, and is currently profound/symptomatic. Patient has had previous hospitalizations for this, precipitated by medication non-compliance, but she assures me that this time, she has been compliant. We shall replete as appropriate, utilizing iv KCL initially. For completeness, we shall also check magnesium levels, and replete, if deficient. Meanwhile, monitor telemetrically.  2. HTN  (hypertension): BP is currently uncontrolled . We shall adjust anti-hypertensive medication.  3. Fever/Leukocytosis: Etiology is unclear at this time, as patient has no localizing symptoms or signs. She does however, have an 96 month old baby, with fever, so a viral illness is suspect. We shall carry out septic work up with CXR and blood cultures, as well as Flu screen, but urinalysis is negative at this time, and as patient does not look septic, we shall not commence antibiotics  at this time.  Time Spent on Admission: 45 mins.  Krista [name],CHRISTOPHER 12/03/2011, 11:25 PM

## 2011-12-03 NOTE — ED Notes (Signed)
Pt up to window c/o acute onset of chest pressure.

## 2011-12-03 NOTE — ED Notes (Signed)
Lavender and lt grn drawn with istat and held in mini lab.

## 2011-12-03 NOTE — ED Provider Notes (Signed)
History     CSN: [id number]  Arrival date & time 12/03/11  1429   First MD Initiated Contact with Patient 12/03/11 1644      Chief Complaint  Patient presents with  . Tingling    HPI Patient presents to the emergency room with the complaint of tingling. Patient states this started about 2 days ago. She's felt tingling in her hands and feet as well as in her chest. Time she's felt like her fingers and toes have been cramping up. She states that she previously had these symptoms associated with a low potassium. She does have history of hyper or is some and hypertension. She has had hypokalemia in the past and this feels similar. While the patient was checking into the emergency department, incidentally her temperature was noted to be elevated. Patient states she was not aware of this. She had not been having any complaints of fevers, chills, cough, sore throat, vomiting, diarrhea, dysuria or urinary frequency. The patient tried to call her doctor today to be seen but he did not have any appointments. Past Medical History  Diagnosis Date  . Hypokalemia 2007    IP Sept 2012  . Hypertension     Past Surgical History  Procedure Date  . Breast reduction surgery Jan 2006  . Cesarean section 2005 2011  . Dilation and curettage of uterus 2003    No family history on file.  History  Substance Use Topics  . Smoking status: Not on file  . Smokeless tobacco: Not on file  . Alcohol Use: No    OB History    Grav Para Term Preterm Abortions TAB SAB Ect Mult Living   4 2 2  1  1  1 3       Review of Systems  All other systems reviewed and are negative.    Allergies  Sulfa antibiotics  Home Medications   Current Outpatient Rx  Name Route Sig Dispense Refill  . SPIRONOLACTONE 100 MG PO TABS Oral Take 100 mg by mouth 3 (three) times daily.      BP 169/95  Pulse 106  Temp(Src) 100.7 F (38.2 C) (Oral)  Resp 20  SpO2 100%  LMP 11/30/2011  Breastfeeding? Unknown  Physical  Exam  Nursing note and vitals reviewed. Constitutional: She appears well-developed and well-nourished. No distress.  HENT:  Head: Normocephalic and atraumatic.  Right Ear: External ear normal.  Left Ear: External ear normal.  Eyes: Conjunctivae are normal. Right eye exhibits no discharge. Left eye exhibits no discharge. No scleral icterus.  Neck: Neck supple. No tracheal deviation present.  Cardiovascular: Normal rate, regular rhythm and intact distal pulses.   Pulmonary/Chest: Effort normal and breath sounds normal. No stridor. No respiratory distress. She has no wheezes. She has no rales.  Abdominal: Soft. Bowel sounds are normal. She exhibits no distension. There is no tenderness. There is no rebound and no guarding.  Musculoskeletal: She exhibits no edema and no tenderness.  Neurological: She is alert. She has normal strength. No sensory deficit. Cranial nerve deficit:  no gross defecits noted. She exhibits normal muscle tone. She displays no seizure activity. Coordination normal.  Skin: Skin is warm and dry. No rash noted.  Psychiatric: She has a normal mood and affect.    ED Course  Procedures (including critical care time)  Rate: 100  Rhythm: normal sinus rhythm  QRS Axis: normal  Intervals: normal  ST/T Wave abnormalities: Nonspecific ST changes  Conduction Disutrbances:none  Narrative Interpretation: Nonspecific ST changes  Old EKG Reviewed: none available  Medications  spironolactone (ALDACTONE) 100 MG tablet (not administered)  potassium chloride SA (K-DUR,KLOR-CON) CR tablet 40 mEq (40 mEq Oral Given 12/03/11 2248)  potassium chloride 10 mEq in 100 mL IVPB (not administered)  potassium chloride 10 mEq in 100 mL IVPB (10 mEq Intravenous Given 12/03/11 1833)  acetaminophen (TYLENOL) tablet 650 mg (650 mg Oral Given 12/03/11 2003)    Labs Reviewed  POCT I-STAT, CHEM 8 - Abnormal; Notable for the following:    Potassium 2.3 (*)    Glucose, Bld 112 (*)    Calcium, Ion 1.03  (*)    All other components within normal limits  CBC - Abnormal; Notable for the following:    WBC 15.6 (*)    RBC 5.62 (*)    Hemoglobin 11.4 (*)    MCV 66.0 (*)    MCH 20.3 (*)    All other components within normal limits  URINALYSIS, ROUTINE W REFLEX MICROSCOPIC - Abnormal; Notable for the following:    Hgb urine dipstick MODERATE (*)    Leukocytes, UA TRACE (*)    All other components within normal limits  POTASSIUM - Abnormal; Notable for the following:    Potassium 2.4 (*)    All other components within normal limits  URINE MICROSCOPIC-ADD ON  CULTURE, BLOOD (ROUTINE X 2)  CULTURE, BLOOD (ROUTINE X 2)  MAGNESIUM   No results found.   1. Hypokalemia   2. Fever       MDM  Pt has significant hypokalemia.   Will start iv infusion as well as oral potassium 10:58 PM the patient continues to remain significantly hypokalemic after an infusion of potassium and an oral dose of potassium. Considering this severe degree of hypokalemia the patient will be admitted to the hospital for IV potassium replacement.  Incidentally she developed a fever while she was here in emergent. She's not having any focal symptoms to suggest the etiology of this temperature. In addition, she denies any complaints and states she feels overall well still. Patient admitted to the hospitalist service for further treatment. A chest x-ray and blood cultures has been added on.  We'll continue to monitor patient closely.        Celene Kras, MD 12/03/11 2300

## 2011-12-03 NOTE — ED Notes (Signed)
Pt states "I think my potassium is low", pt c/o tingling to hands/legs/back/chest x2 days, states hx of same. Pt has a temp of 100.7, denies n/v/d

## 2011-12-04 ENCOUNTER — Encounter (HOSPITAL_COMMUNITY): Payer: Self-pay | Admitting: *Deleted

## 2011-12-04 DIAGNOSIS — D72829 Elevated white blood cell count, unspecified: Secondary | ICD-10-CM | POA: Diagnosis present

## 2011-12-04 LAB — CBC
MCHC: 31.1 g/dL (ref 30.0–36.0)
Platelets: 329 10*3/uL (ref 150–400)
RDW: 15.6 % — ABNORMAL HIGH (ref 11.5–15.5)
WBC: 16.4 10*3/uL — ABNORMAL HIGH (ref 4.0–10.5)

## 2011-12-04 LAB — DIFFERENTIAL
Basophils Absolute: 0 10*3/uL (ref 0.0–0.1)
Eosinophils Absolute: 0 10*3/uL (ref 0.0–0.7)
Lymphocytes Relative: 11 % — ABNORMAL LOW (ref 12–46)
Neutrophils Relative %: 84 % — ABNORMAL HIGH (ref 43–77)

## 2011-12-04 LAB — COMPREHENSIVE METABOLIC PANEL
Alkaline Phosphatase: 90 U/L (ref 39–117)
BUN: 6 mg/dL (ref 6–23)
Chloride: 104 mEq/L (ref 96–112)
Creatinine, Ser: 0.62 mg/dL (ref 0.50–1.10)
GFR calc Af Amer: >90 mL/min (ref >90)
Glucose, Bld: 107 mg/dL — ABNORMAL HIGH (ref 70–99)
Potassium: 3.1 mEq/L — ABNORMAL LOW (ref 3.5–5.1)
Total Bilirubin: 0.4 mg/dL (ref 0.3–1.2)

## 2011-12-04 LAB — MAGNESIUM: Magnesium: 1.6 mg/dL (ref 1.5–2.5)

## 2011-12-04 LAB — INFLUENZA PANEL BY PCR (TYPE A & B): Influenza A By PCR: NEGATIVE

## 2011-12-04 LAB — PREGNANCY, URINE: Preg Test, Ur: NEGATIVE

## 2011-12-04 MED ORDER — AMLODIPINE BESYLATE 10 MG PO TABS
10.0000 mg | ORAL_TABLET | Freq: Once | ORAL | Status: AC
  Administered 2011-12-04: 10 mg via ORAL
  Filled 2011-12-04: qty 1

## 2011-12-04 MED ORDER — POTASSIUM CHLORIDE CRYS ER 20 MEQ PO TBCR
40.0000 meq | EXTENDED_RELEASE_TABLET | ORAL | Status: AC
  Administered 2011-12-04 (×2): 40 meq via ORAL
  Filled 2011-12-04 (×2): qty 2

## 2011-12-04 MED ORDER — SPIRONOLACTONE 100 MG PO TABS
100.0000 mg | ORAL_TABLET | Freq: Three times a day (TID) | ORAL | Status: DC
  Filled 2011-12-04 (×3): qty 1

## 2011-12-04 MED ORDER — SPIRONOLACTONE 100 MG PO TABS
200.0000 mg | ORAL_TABLET | Freq: Two times a day (BID) | ORAL | Status: DC
  Administered 2011-12-04 – 2011-12-05 (×2): 200 mg via ORAL
  Filled 2011-12-04 (×5): qty 2

## 2011-12-04 MED ORDER — MAGNESIUM SULFATE 40 MG/ML IJ SOLN
2.0000 g | Freq: Once | INTRAMUSCULAR | Status: AC
  Administered 2011-12-04: 2 g via INTRAVENOUS
  Filled 2011-12-04 (×2): qty 50

## 2011-12-04 MED ORDER — SPIRONOLACTONE 100 MG PO TABS: 200.0000 mg | ORAL_TABLET | Freq: Two times a day (BID) | ORAL | Status: DC

## 2011-12-04 NOTE — Consult Note (Signed)
Reason for Consult:primary aldosteronism Referring Physician: Dr. Ramiro Harvest, [facility]  Krista [name] is an 31 y.o. female.  HPI: 2 days ago, she developed paresthesias in her hands, feet, and around her mouth. Yesterday the symptoms worsen. No associated symptoms otherwise. These are similar to her previous manifestations of hypokalemia from her primary aldosteronism.  She had been pregnant in January 2013. However, "I got rid of that baby" in early February 2013. She reports she been taking her spironolactone 3 tablets by mouth once a day without a potassium supplement, other diuretics, or alcohol use.  Past Medical History  Diagnosis Date  . Hypokalemia 2007    IP Sept 2012  . Hypertension   . Primary aldosteronism   . Migraine headache   . Scoliosis   . Thalassemia minor     Past Surgical History  Procedure Date  . Breast reduction surgery Jan 2006  . Cesarean section 2005 2011  . Dilation and curettage of uterus 2003    Family History  Problem Relation Age of Onset  . Thalassemia Mother   . Hypertension Father   . Heart attack Father     Social History:  reports that she has never smoked. She does not have any smokeless tobacco history on file. She reports that she does not drink alcohol or use illicit drugs.  She is now divorced.  She has twins 73 years of age and a child 61 months of age.  Allergies:  Allergies  Allergen Reactions  . Sulfa Antibiotics Other (See Comments)    Eyes turn blood shot red    Medications:  I have reviewed the patient's current medications. Scheduled:   . acetaminophen  650 mg Oral Once  . amLODipine  10 mg Oral Daily  . amLODipine  10 mg Oral Once  . enoxaparin  40 mg Subcutaneous Q24H  . magnesium sulfate 1 - 4 g bolus IVPB  2 g Intravenous Once  . potassium chloride  10 mEq Intravenous Once  . potassium chloride  10 mEq Intravenous Once  . potassium chloride  10 mEq Intravenous Q1 Hr x 4  . potassium chloride  40  mEq Oral Q4H  . sodium chloride  3 mL Intravenous Q12H  . DISCONTD: potassium chloride  40 mEq Oral BID  . DISCONTD: spironolactone  100 mg Oral TID   Continuous:  AVW:[id number], acetaminophen, HYDROmorphone, ondansetron (ZOFRAN) IV, ondansetron, oxyCODONE  Results for orders placed during the hospital encounter of 12/03/11 (from the past 48 hour(s))  CBC     Status: Abnormal   Collection Time   12/03/11  5:15 PM      Component Value Range Comment   WBC 15.6 (*) 4.0 - 10.5 (K/uL)    RBC 5.62 (*) 3.87 - 5.11 (MIL/uL)    Hemoglobin 11.4 (*) 12.0 - 15.0 (g/dL)    HCT 62.1  30.8 - 65.7 (%)    MCV 66.0 (*) 78.0 - 100.0 (fL)    MCH 20.3 (*) 26.0 - 34.0 (pg)    MCHC 30.7  30.0 - 36.0 (g/dL)    RDW 84.6  96.2 - 95.2 (%)    Platelets 337  150 - 400 (K/uL)   POCT I-STAT, CHEM 8     Status: Abnormal   Collection Time   12/03/11  5:31 PM      Component Value Range Comment   Sodium 141  135 - 145 (mEq/L)    Potassium 2.3 (*) 3.5 - 5.1 (mEq/L)    Chloride 99  96 - 112 (mEq/L)    BUN 8  6 - 23 (mg/dL)    Creatinine, Ser 1.61  0.50 - 1.10 (mg/dL)    Glucose, Bld 096 (*) 70 - 99 (mg/dL)    Calcium, Ion 0.45 (*) 1.12 - 1.32 (mmol/L)    TCO2 29  0 - 100 (mmol/L)    Hemoglobin 13.9  12.0 - 15.0 (g/dL)    HCT 40.9  81.1 - 91.4 (%)   URINALYSIS, ROUTINE W REFLEX MICROSCOPIC     Status: Abnormal   Collection Time   12/03/11  8:11 PM      Component Value Range Comment   Color, Urine YELLOW  YELLOW     APPearance CLEAR  CLEAR     Specific Gravity, Urine 1.011  1.005 - 1.030     pH 7.5  5.0 - 8.0     Glucose, UA NEGATIVE  NEGATIVE (mg/dL)    Hgb urine dipstick MODERATE (*) NEGATIVE     Bilirubin Urine NEGATIVE  NEGATIVE     Ketones, ur NEGATIVE  NEGATIVE (mg/dL)    Protein, ur NEGATIVE  NEGATIVE (mg/dL)    Urobilinogen, UA 1.0  0.0 - 1.0 (mg/dL)    Nitrite NEGATIVE  NEGATIVE     Leukocytes, UA TRACE (*) NEGATIVE    URINE MICROSCOPIC-ADD ON     Status: Normal   Collection Time   12/03/11  8:11  PM      Component Value Range Comment   Squamous Epithelial / LPF RARE  RARE     WBC, UA 0-2  <3 (WBC/hpf)    RBC / HPF 0-2  <3 (RBC/hpf)   POTASSIUM     Status: Abnormal   Collection Time   12/03/11  8:35 PM      Component Value Range Comment   Potassium 2.4 (*) 3.5 - 5.1 (mEq/L)   MAGNESIUM     Status: Normal   Collection Time   12/03/11 11:20 PM      Component Value Range Comment   Magnesium 1.6  1.5 - 2.5 (mg/dL)   INFLUENZA PANEL BY PCR     Status: Normal   Collection Time   12/04/11  4:40 AM      Component Value Range Comment   Influenza A By PCR NEGATIVE  NEGATIVE     Influenza B By PCR NEGATIVE  NEGATIVE     H1N1 flu by pcr NOT DETECTED  NOT DETECTED    COMPREHENSIVE METABOLIC PANEL     Status: Abnormal   Collection Time   12/04/11  4:46 AM      Component Value Range Comment   Sodium 141  135 - 145 (mEq/L)    Potassium 3.1 (*) 3.5 - 5.1 (mEq/L)    Chloride 104  96 - 112 (mEq/L)    CO2 27  19 - 32 (mEq/L)    Glucose, Bld 107 (*) 70 - 99 (mg/dL)    BUN 6  6 - 23 (mg/dL)    Creatinine, Ser 7.82  0.50 - 1.10 (mg/dL)    Calcium 8.5  8.4 - 10.5 (mg/dL)    Total Protein 7.4  6.0 - 8.3 (g/dL)    Albumin 3.3 (*) 3.5 - 5.2 (g/dL)    AST 16  0 - 37 (U/L)    ALT 10  0 - 35 (U/L)    Alkaline Phosphatase 90  39 - 117 (U/L)    Total Bilirubin 0.4  0.3 - 1.2 (mg/dL)    GFR calc non Af  Amer >90  >90 (mL/min)    GFR calc Af Amer >90  >90 (mL/min)   MAGNESIUM     Status: Normal   Collection Time   12/04/11  4:46 AM      Component Value Range Comment   Magnesium 1.6  1.5 - 2.5 (mg/dL)   CBC     Status: Abnormal   Collection Time   12/04/11  4:46 AM      Component Value Range Comment   WBC 16.4 (*) 4.0 - 10.5 (K/uL)    RBC 5.43 (*) 3.87 - 5.11 (MIL/uL)    Hemoglobin 11.2 (*) 12.0 - 15.0 (g/dL)    HCT 41.3  24.4 - 01.0 (%)    MCV 66.3 (*) 78.0 - 100.0 (fL)    MCH 20.6 (*) 26.0 - 34.0 (pg)    MCHC 31.1  30.0 - 36.0 (g/dL)    RDW 27.2 (*) 53.6 - 15.5 (%)    Platelets 329  150 - 400  (K/uL)   DIFFERENTIAL     Status: Abnormal   Collection Time   12/04/11  4:46 AM      Component Value Range Comment   Neutrophils Relative 84 (*) 43 - 77 (%)    Lymphocytes Relative 11 (*) 12 - 46 (%)    Monocytes Relative 5  3 - 12 (%)    Eosinophils Relative 0  0 - 5 (%)    Basophils Relative 0  0 - 1 (%)    Neutro Abs 13.8 (*) 1.7 - 7.7 (K/uL)    Lymphs Abs 1.8  0.7 - 4.0 (K/uL)    Monocytes Absolute 0.8  0.1 - 1.0 (K/uL)    Eosinophils Absolute 0.0  0.0 - 0.7 (K/uL)    Basophils Absolute 0.0  0.0 - 0.1 (K/uL)    RBC Morphology STOMATOCYTES     PREGNANCY, URINE     Status: Normal   Collection Time   12/04/11 10:37 AM      Component Value Range Comment   Preg Test, Ur NEGATIVE  NEGATIVE      Dg Chest 2 View  12/03/2011  *RADIOLOGY REPORT*  Clinical Data: Fever and tingling  CHEST - 2 VIEW  Comparison: Chest 12/27/2008  Findings: Normal heart size and pulmonary vascularity.  No focal airspace consolidation in the lungs.  No blunting of costophrenic angles.  No pneumothorax.  No significant change since the previous study.  IMPRESSION: No evidence of active pulmonary disease.  Original Report Authenticated By: Marlon Pel, M.D.    ROS General: No weight loss, no weight gain. Cardiac: No chest pain, mild pedal edema which is intermittent. Ophthalmology:  No double vision, no tunnel vision, no blurred vision. Respiratory: No cough, no dyspnea. Gastrointestinal: No constipation, no diarrhea. Endocrine: Polydipsia and polyuria but she attributes this to her spironolactone. Mental status: No depression, no anxiety. Musculoskeletal: No muscle weakness, no muscle aches. Neurologic: No headache, no tremor. Hematologic: No bleeding, no bruising.   Blood pressure 162/91, pulse 89, temperature 98.4 F (36.9 C), temperature source Oral, resp. rate 18, height 5\' 7"  (1.702 m), weight 90.2 kg (198 lb 13.7 oz), last menstrual period 11/30/2011, SpO2 100.00%, unknown if currently  breastfeeding.  Physical Exam General: No apparent distress. Eyes: Anicteric, no scleral show, no periorbital puffiness, no lid lag. Neck: Supple, trachea midline. Thyroid: no apparent thyromegaly. Cardiovascular: Regular rhythm and rate, normal radial pulses. Respiratory: Normal respiratory effort. Gastrointestinal: Nontender abdomen without distention or appreciable hepatosplenomegaly or palpable mass. Neurologic: Cranial nerves  normal as tested, no tremor. Musculoskeletal: Normal muscle tone, no muscle atrophy. Skin: Appropriate warmth, no visible rash. Mental status: Alert, conversant, speech clear, thought logical, mildly constricted affect congruent with mood and seems appropriate for circumstances, no hallucinations or delusions evident. Hematologic/lymphatic: No cervical adenopathy, no jaundice.    Assessment: 1.  Primary aldosteronism. 2.  Hypertension--likely due to primary aldosteronism. 3.  Hypokalemia--likely due to primary aldosteronism.  Informed consent obtained after discussion of anticipated benefits, alternatives, nature of therapy, and risks--including but not limited to hyperkalemia, etc. from spironolactone.    Her potassium had been appropriate at 3.5 mmol per liter on May 28, 2011 when taking spironolactone 100 mg tablets, 2 tablets by mouth twice a day as well as potassium chloride capsules one capsule by mouth 4 times a day.  Plan: #1.  Resumes spironolactone 200 mg by mouth twice a day. #2.  Continue potassium supplement, if needed, with close monitoring of potassium level. Anda Kraft agreed to a medical assistant visit for blood pressure check and a lab visit for basic metabolic panel at 10 AM on Friday, December 07, 2011 at [facility]. Antoine Primas agreed to call [phone] for an office visit with me (Dr. Sharl Ma) in one or two weeks.  As an outpatient, we can consider laboratory test for the possibility of glucocorticoid remediable aldosteronism.   We can also consider adrenal vein sampling in case her primary aldosteronism might be from a unilateral source treatable with unilateral laparoscopic adrenalectomy.  As long as her hypokalemia remains mild, discharge planning as per the hospitalist team.  Justyce Baby 12/04/2011, 2:09 PM

## 2011-12-04 NOTE — Progress Notes (Addendum)
Subjective: Patient states tingling and numbness has resolved. No cough, no dysuria. No complaints.  Objective: Vital signs in last 24 hours: Filed Vitals:   12/03/11 2110 12/04/11 0009 12/04/11 0042 12/04/11 0450  BP:  146/83 184/95 162/91  Pulse:  94 104 89  Temp: 100.2 F (37.9 C) 98.4 F (36.9 C) 98.5 F (36.9 C) 98.4 F (36.9 C)  TempSrc: Axillary Oral Oral Oral  Resp:  20 19 18   Height:   5\' 7"  (1.702 m)   Weight:   90.2 kg (198 lb 13.7 oz)   SpO2:  100% 100% 100%   No intake or output data in the 24 hours ending 12/04/11 0857  Weight change:   General: Alert, awake, oriented x3, in no acute distress. Heart: Regular rate and rhythm, without murmurs, rubs, gallops. Lungs: Clear to auscultation bilaterally. Abdomen: Soft, nontender, nondistended, positive bowel sounds. Extremities: No clubbing cyanosis or edema with positive pedal pulses.    Lab Results:  Basename 12/04/11 0446 12/03/11 2320 12/03/11 2035 12/03/11 1731  NA 141 -- -- 141  K 3.1* -- 2.4* --  CL 104 -- -- 99  CO2 27 -- -- --  GLUCOSE 107* -- -- 112*  BUN 6 -- -- 8  CREATININE 0.62 -- -- 0.80  CALCIUM 8.5 -- -- --  MG 1.6 1.6 -- --  PHOS -- -- -- --    Basename 12/04/11 0446  AST 16  ALT 10  ALKPHOS 90  BILITOT 0.4  PROT 7.4  ALBUMIN 3.3*   No results found for this basename: LIPASE:2,AMYLASE:2 in the last 72 hours  Basename 12/04/11 0446 12/03/11 1731 12/03/11 1715  WBC 16.4* -- 15.6*  NEUTROABS 13.8* -- --  HGB 11.2* 13.9 --  HCT 36.0 41.0 --  MCV 66.3* -- 66.0*  PLT 329 -- 337   No results found for this basename: CKTOTAL:3,CKMB:3,CKMBINDEX:3,TROPONINI:3 in the last 72 hours No components found with this basename: POCBNP:3 No results found for this basename: DDIMER:2 in the last 72 hours No results found for this basename: HGBA1C:2 in the last 72 hours No results found for this basename: CHOL:2,HDL:2,LDLCALC:2,TRIG:2,CHOLHDL:2,LDLDIRECT:2 in the last 72 hours No results found  for this basename: TSH,T4TOTAL,FREET3,T3FREE,THYROIDAB in the last 72 hours No results found for this basename: VITAMINB12:2,FOLATE:2,FERRITIN:2,TIBC:2,IRON:2,RETICCTPCT:2 in the last 72 hours  Micro Results: No results found for this or any previous visit (from the past 240 hour(s)).  Studies/Results: Dg Chest 2 View  12/03/2011  *RADIOLOGY REPORT*  Clinical Data: Fever and tingling  CHEST - 2 VIEW  Comparison: Chest 12/27/2008  Findings: Normal heart size and pulmonary vascularity.  No focal airspace consolidation in the lungs.  No blunting of costophrenic angles.  No pneumothorax.  No significant change since the previous study.  IMPRESSION: No evidence of active pulmonary disease.  Original Report Authenticated By: Marlon Pel, M.D.    Medications:     . acetaminophen  650 mg Oral Once  . amLODipine  10 mg Oral Daily  . amLODipine  10 mg Oral Once  . enoxaparin  40 mg Subcutaneous Q24H  . magnesium sulfate 1 - 4 g bolus IVPB  2 g Intravenous Once  . potassium chloride  10 mEq Intravenous Once  . potassium chloride  10 mEq Intravenous Once  . potassium chloride  10 mEq Intravenous Q1 Hr x 4  . potassium chloride  40 mEq Oral Q4H  . sodium chloride  3 mL Intravenous Q12H  . spironolactone  100 mg Oral TID  . DISCONTD:  potassium chloride  40 mEq Oral BID    Assessment: Principal Problem:  *Hyperaldosteronism Active Problems:  Hypokalemia  HTN (hypertension)  Fever  Leukocytosis   Plan: #1. Hypoaldosteronism Patient states has been compliant with her medications and not missed any doses. Hypokalemia improved. Will replete potassium and keep magnesium levels  2 >. Will resume spironolactone at 100mg  TID and consult with Dr Sharl Ma of endocrinology.  #2 Hypokalemia Likely secondary to #1. Patient states has been compliant with her medications. Keep magnesium 2 >. Replete potasium Follow. See #1.  #3 Fever/leukocytosis ??etiology. CXR negative. UA negative. WBC slowly  climbing. Patient denies cough or dysuria. Maybe viral. Follow for now. Will hold off antibiotics for this time until a source is found.  #4 HTN Started on Norvasc. Follow.  #5 Prophylaxis Lovenox for DVT.   LOS: 1 day   [facility] 12/04/2011, 8:57 AM  Spoke with Dr. Sharl Ma of endocrinology  and he stated that in January of 2013 patient was pregnant at that time as he had gotten a call from her OB/GYN for medication management. If patient is pregnant ,aldactone was not the best medication at this time for her hyperaldosteronism. Spoke with patient and the patient states she is no longer pregnant. Patient states that she had a spontaneous abortion  in January of this year. Patient stated that she just started her menses. Will check a urine pregnancy test. And if urine pregnancy test is negative will place patient on spironolactone 200 mg twice a day. Spironolactone which was previously ordered a few minutes ago has been discontinued until urine pregnancy test results are back. Dr. Sharl Ma of endocrinology will consult on the patient later on today.

## 2011-12-05 LAB — DIFFERENTIAL
Basophils Absolute: 0 10*3/uL (ref 0.0–0.1)
Basophils Absolute: 0 10*3/uL (ref 0.0–0.1)
Eosinophils Absolute: 0.2 10*3/uL (ref 0.0–0.7)
Lymphocytes Relative: 11 % — ABNORMAL LOW (ref 12–46)
Lymphs Abs: 1.5 10*3/uL (ref 0.7–4.0)
Monocytes Absolute: 0.6 10*3/uL (ref 0.1–1.0)
Monocytes Relative: 3 % (ref 3–12)
Neutrophils Relative %: 84 % — ABNORMAL HIGH (ref 43–77)

## 2011-12-05 LAB — BASIC METABOLIC PANEL
Calcium: 9 mg/dL (ref 8.4–10.5)
GFR calc Af Amer: >90 mL/min (ref >90)
GFR calc non Af Amer: >90 mL/min (ref >90)
Glucose, Bld: 137 mg/dL — ABNORMAL HIGH (ref 70–99)
Sodium: 136 mEq/L (ref 135–145)

## 2011-12-05 LAB — CBC
Hemoglobin: 12.1 g/dL (ref 12.0–15.0)
MCH: 20.5 pg — ABNORMAL LOW (ref 26.0–34.0)
MCHC: 30.9 g/dL (ref 30.0–36.0)
Platelets: 371 10*3/uL (ref 150–400)
RBC: 5.67 MIL/uL — ABNORMAL HIGH (ref 3.87–5.11)
RDW: 15.7 % — ABNORMAL HIGH (ref 11.5–15.5)

## 2011-12-05 LAB — MAGNESIUM
Magnesium: 1.7 mg/dL (ref 1.5–2.5)
Magnesium: 2.3 mg/dL (ref 1.5–2.5)

## 2011-12-05 LAB — POTASSIUM: Potassium: 3.4 mEq/L — ABNORMAL LOW (ref 3.5–5.1)

## 2011-12-05 MED ORDER — POTASSIUM CHLORIDE CRYS ER 20 MEQ PO TBCR
40.0000 meq | EXTENDED_RELEASE_TABLET | ORAL | Status: AC
  Administered 2011-12-05 (×2): 40 meq via ORAL
  Filled 2011-12-05 (×2): qty 2

## 2011-12-05 MED ORDER — SALINE SPRAY 0.65 % NA SOLN
1.0000 | NASAL | Status: DC | PRN
  Administered 2011-12-05: 1 via NASAL
  Filled 2011-12-05: qty 44

## 2011-12-05 MED ORDER — MAGNESIUM SULFATE 40 MG/ML IJ SOLN
2.0000 g | Freq: Once | INTRAMUSCULAR | Status: AC
  Administered 2011-12-05: 2 g via INTRAVENOUS
  Filled 2011-12-05: qty 50

## 2011-12-05 MED ORDER — MENTHOL 3 MG MT LOZG: 1.0000 | LOZENGE | OROMUCOSAL | Status: DC | PRN

## 2011-12-05 MED ORDER — MENTHOL 3 MG MT LOZG
1.0000 | LOZENGE | OROMUCOSAL | Status: DC | PRN
Start: 2011-12-05 — End: 2011-12-05
  Administered 2011-12-05: 3 mg via ORAL
  Filled 2011-12-05: qty 9

## 2011-12-05 MED ORDER — SALINE SPRAY 0.65 % NA SOLN: 1.0000 | NASAL | Status: DC | PRN

## 2011-12-05 MED ORDER — POTASSIUM CHLORIDE ER 10 MEQ PO TBCR: 20.0000 meq | EXTENDED_RELEASE_TABLET | Freq: Two times a day (BID) | ORAL | Status: DC

## 2011-12-05 MED ORDER — AMLODIPINE BESYLATE 10 MG PO TABS: 10.0000 mg | ORAL_TABLET | Freq: Every day | ORAL | Status: DC

## 2011-12-05 MED ORDER — SPIRONOLACTONE 100 MG PO TABS: 200.0000 mg | ORAL_TABLET | Freq: Two times a day (BID) | ORAL | Status: DC

## 2011-12-05 NOTE — Discharge Summary (Signed)
White blood cell count now decreased. Likely from stress margination brought on by electrolyte abnormalities as evidenced by treatment of electrolytes improved her white blood cell count. No signs of infection. No fever. Safe for discharge and will followup with her endocrinologist for repeat CBC in 2 days.

## 2011-12-05 NOTE — Progress Notes (Signed)
Patient seen and examined and discussed with my nurse practitioner. Elevated white blood cell count likely secondary to stress margination is no source of infection has been found and patient has had no temperature. Stress margination likely from electrolyte abnormalities, as evidenced by the fact that with replacement of magnesium and potassium, her white blood cell count has Ardee dropped from 19 down to 16. Felt to be safe for discharge and planned followup in 2 days with her endocrinologist who can repeat a CBC at that time.

## 2011-12-05 NOTE — Progress Notes (Signed)
Subjective: Lying in bed eyes closed. Aroused to verbal stimuli. Reports tingling/numbness resolved. Reports "cold".  Objective: Vital signs Filed Vitals:   12/04/11 0450 12/04/11 1424 12/04/11 2219 12/05/11 0431  BP: 162/91 148/85 143/83 143/87  Pulse: 89 100 110 104  Temp: 98.4 F (36.9 C) 98.8 F (37.1 C) 99.2 F (37.3 C) 99.2 F (37.3 C)  TempSrc: Oral Oral Oral Oral  Resp: 18 18 19 18   Height:      Weight:      SpO2: 100% 99% 99% 100%   Weight change:  Last BM Date: 12/03/11  Intake/Output from previous day:       Physical Exam: General: Alert, awake, oriented x3, in no acute distress. HEENT: No bruits, no goiter.Mucus membranes mouth moist/pink. PERRL, No erythema/exudate in throat Heart: Regular rate and rhythm, without murmurs, rubs, gallops. Lungs: Normal effort. Breath sounds clear to auscultation bilaterally. No wheeze, rhonchi Abdomen: Soft, nontender, nondistended, positive bowel sounds. Extremities: No clubbing cyanosis or edema with positive pedal pulses. Neuro: Grossly intact, nonfocal.    Lab Results: Basic Metabolic Panel:  Basename 12/05/11 0431 12/04/11 0446  NA 136 141  K 3.2* 3.1*  CL 99 104  CO2 26 27  GLUCOSE 137* 107*  BUN 7 6  CREATININE 0.68 0.62  CALCIUM 9.0 8.5  MG 1.7 1.6  PHOS -- --   Liver Function Tests:  Basename 12/04/11 0446  AST 16  ALT 10  ALKPHOS 90  BILITOT 0.4  PROT 7.4  ALBUMIN 3.3*   No results found for this basename: LIPASE:2,AMYLASE:2 in the last 72 hours No results found for this basename: AMMONIA:2 in the last 72 hours CBC:  Basename 12/05/11 0431 12/04/11 0446  WBC 19.1* 16.4*  NEUTROABS 16.8* 13.8*  HGB 11.6* 11.2*  HCT 37.6 36.0  MCV 66.3* 66.3*  PLT 371 329   Cardiac Enzymes: No results found for this basename: CKTOTAL:3,CKMB:3,CKMBINDEX:3,TROPONINI:3 in the last 72 hours BNP: No results found for this basename: PROBNP:3 in the last 72 hours D-Dimer: No results found for this basename:  DDIMER:2 in the last 72 hours CBG: No results found for this basename: GLUCAP:6 in the last 72 hours Hemoglobin A1C: No results found for this basename: HGBA1C in the last 72 hours Fasting Lipid Panel: No results found for this basename: CHOL,HDL,LDLCALC,TRIG,CHOLHDL,LDLDIRECT in the last 72 hours Thyroid Function Tests: No results found for this basename: TSH,T4TOTAL,FREET4,T3FREE,THYROIDAB in the last 72 hours Anemia Panel: No results found for this basename: VITAMINB12,FOLATE,FERRITIN,TIBC,IRON,RETICCTPCT in the last 72 hours Coagulation: No results found for this basename: LABPROT:2,INR:2 in the last 72 hours Urine Drug Screen: Drugs of Abuse     Component Value Date/Time   LABOPIA NONE DETECTED 07/09/2007 1100   COCAINSCRNUR NONE DETECTED 07/09/2007 1100   LABBENZ NONE DETECTED 07/09/2007 1100   AMPHETMU NONE DETECTED 07/09/2007 1100   THCU NONE DETECTED 07/09/2007 1100   LABBARB  Value: NONE DETECTED        DRUG SCREEN FOR MEDICAL PURPOSES ONLY.  IF CONFIRMATION IS NEEDED FOR ANY PURPOSE, NOTIFY LAB WITHIN 5 DAYS. 07/09/2007 1100    Alcohol Level: No results found for this basename: ETH:2 in the last 72 hours Urinalysis:  Basename 12/03/11 2011  COLORURINE YELLOW  LABSPEC 1.011  PHURINE 7.5  GLUCOSEU NEGATIVE  HGBUR MODERATE*  BILIRUBINUR NEGATIVE  KETONESUR NEGATIVE  PROTEINUR NEGATIVE  UROBILINOGEN 1.0  NITRITE NEGATIVE  LEUKOCYTESUR TRACE*   Misc. Labs:  No results found for this or any previous visit (from the past 240 hour(s)).  Studies/Results: Dg Chest 2 View  12/03/2011  *RADIOLOGY REPORT*  Clinical Data: Fever and tingling  CHEST - 2 VIEW  Comparison: Chest 12/27/2008  Findings: Normal heart size and pulmonary vascularity.  No focal airspace consolidation in the lungs.  No blunting of costophrenic angles.  No pneumothorax.  No significant change since the previous study.  IMPRESSION: No evidence of active pulmonary disease.  Original Report Authenticated By:  Marlon Pel, M.D.    Medications: Scheduled Meds:   . amLODipine  10 mg Oral Daily  . enoxaparin  40 mg Subcutaneous Q24H  . magnesium sulfate 1 - 4 g bolus IVPB  2 g Intravenous Once  . potassium chloride  40 mEq Oral Q4H  . potassium chloride  40 mEq Oral Q4H  . sodium chloride  3 mL Intravenous Q12H  . spironolactone  200 mg Oral BID  . DISCONTD: spironolactone  100 mg Oral TID  . DISCONTD: spironolactone  200 mg Oral BID   Continuous Infusions:  PRN Meds:.acetaminophen, acetaminophen, HYDROmorphone, menthol-cetylpyridinium, ondansetron (ZOFRAN) IV, ondansetron, oxyCODONE, sodium chloride  Assessment/Plan:  Principal Problem:  *Hyperaldosteronism Active Problems:  Hypokalemia  HTN (hypertension)  Fever  Leukocytosis 1. Hypoaldosteronism  Patient states has been compliant with her medications and not missed any doses. Hypokalemia improved. Will continue to  replete potassium and monitor. Keep magnesium levels 2 >. Currently 1.7. Will give 2gm IV as 2gm given yesterday and mg increased to 1.7 from 1.6. Will need close OP monitoring. Continue spironolactone at 200mg  BID. Appreciate assistance of  Dr Sharl Ma of endocrinology.  #2 Hypokalemia  Likely secondary to #1. Patient states has been compliant with her medications. Keep magnesium 2 >. Replete potasium Follow. See #1.  #3 Fever/leukocytosis  ??etiology. CXR negative. UA negative. bld culture pending. Temp 99.2. WBC continues to slowly climb. Patient denies cough or dysuria but report "cold".   Likely viral. Follow for now.  Non toxic appearing. Will hold off antibiotics for this time until a source is found.  #4 HTN   Fair control. Continue Norvasc. Follow. #5. Dispo: home likely this afternoon. To see Dr. Sharl Ma 12/07/11.    LOS: 2 days   [facility] M 12/05/2011, 8:04 AM

## 2011-12-05 NOTE — Progress Notes (Signed)
   CARE MANAGEMENT NOTE 12/05/2011  Patient:  Krista [name], Krista [name]   Account Number:  [id number]  Date Initiated:  12/05/2011  Documentation initiated by:  Lanier Clam  Subjective/Objective Assessment:   ADMITTED W/TINGLING,PARAESTHESIA     Action/Plan:   FROM HOME W/FAMILY   Anticipated DC Date:  12/06/2011   Anticipated DC Plan:  HOME/SELF CARE         Choice offered to / List presented to:             Status of service:  In process, will continue to follow Medicare Important Message given?   (If response is "NO", the following Medicare IM given date fields will be blank) Date Medicare IM given:   Date Additional Medicare IM given:    Discharge Disposition:    Per UR Regulation:  Reviewed for med. necessity/level of care/duration of stay  If discussed at Long Length of Stay Meetings, dates discussed:    Comments:  12/05/11 [facility] RN,BSN NCM [phone]

## 2011-12-05 NOTE — Discharge Summary (Signed)
Physician Discharge Summary  Patient ID: Krista [name] MRN: [id number] DOB/AGE: 02-Jan-1981 30 y.o.  Admit date: 12/03/2011 Discharge date: 12/05/2011  Primary Care Physician:  No primary provider on file.   Discharge Diagnoses:    Principal Problem:  *Hyperaldosteronism Active Problems:  Hypokalemia  HTN (hypertension)  Fever  Leukocytosis   Medication List  As of 12/05/2011  2:24 PM   TAKE these medications         amLODipine 10 MG tablet   Commonly known as: NORVASC   Take 1 tablet (10 mg total) by mouth daily.      menthol-cetylpyridinium 3 MG lozenge   Commonly known as: CEPACOL   Take 1 lozenge (3 mg total) by mouth as needed.      potassium chloride 10 MEQ tablet   Commonly known as: K-DUR   Take 2 tablets (20 mEq total) by mouth 2 (two) times daily.      sodium chloride 0.65 % Soln nasal spray   Commonly known as: OCEAN   Place 1 spray into the nose as needed for congestion.      spironolactone 100 MG tablet   Commonly known as: ALDACTONE   Take 2 tablets (200 mg total) by mouth 2 (two) times daily.             Disposition and Follow-up:  Pt medically stable and ready for discharge to home. Will see Dr. Sharl Ma for follow up 12/07/11 at 10:30  Consults:  Dr. Sharl Ma, endocrinology  Physical exam: see progress note dated 12/05/11   Significant Diagnostic Studies:  Dg Chest 2 View  12/03/2011  *RADIOLOGY REPORT*  Clinical Data: Fever and tingling  CHEST - 2 VIEW  Comparison: Chest 12/27/2008  Findings: Normal heart size and pulmonary vascularity.  No focal airspace consolidation in the lungs.  No blunting of costophrenic angles.  No pneumothorax.  No significant change since the previous study.  IMPRESSION: No evidence of active pulmonary disease.  Original Report Authenticated By: Marlon Pel, M.D.    Labs Reviewed  POCT I-STAT, CHEM 8 - Abnormal; Notable for the following:    Potassium 2.3 (*)    Glucose, Bld 112 (*)    Calcium, Ion 1.03 (*)    All  other components within normal limits  CBC - Abnormal; Notable for the following:    WBC 15.6 (*)    RBC 5.62 (*)    Hemoglobin 11.4 (*)    MCV 66.0 (*)    MCH 20.3 (*)    All other components within normal limits  URINALYSIS, ROUTINE W REFLEX MICROSCOPIC - Abnormal; Notable for the following:    Hgb urine dipstick MODERATE (*)    Leukocytes, UA TRACE (*)    All other components within normal limits  POTASSIUM - Abnormal; Notable for the following:    Potassium 2.4 (*)    All other components within normal limits  COMPREHENSIVE METABOLIC PANEL - Abnormal; Notable for the following:    Potassium 3.1 (*)    Glucose, Bld 107 (*)    Albumin 3.3 (*)    All other components within normal limits  CBC - Abnormal; Notable for the following:    WBC 16.4 (*)    RBC 5.43 (*)    Hemoglobin 11.2 (*)    MCV 66.3 (*)    MCH 20.6 (*)    RDW 15.6 (*)    All other components within normal limits  DIFFERENTIAL - Abnormal; Notable for the following:    Neutrophils Relative  84 (*)    Lymphocytes Relative 11 (*)    Neutro Abs 13.8 (*)    All other components within normal limits  BASIC METABOLIC PANEL - Abnormal; Notable for the following:    Potassium 3.2 (*)    Glucose, Bld 137 (*)    All other components within normal limits  CBC - Abnormal; Notable for the following:    WBC 19.1 (*)    RBC 5.67 (*)    Hemoglobin 11.6 (*)    MCV 66.3 (*)    MCH 20.5 (*)    RDW 15.7 (*)    All other components within normal limits  DIFFERENTIAL - Abnormal; Notable for the following:    Neutrophils Relative 88 (*)    Lymphocytes Relative 8 (*)    Neutro Abs 16.8 (*)    All other components within normal limits  CBC - Abnormal; Notable for the following:    WBC 16.1 (*)    RBC 5.99 (*)    MCV 65.3 (*)    MCH 20.2 (*)    RDW 15.7 (*)    Platelets 407 (*)    All other components within normal limits  DIFFERENTIAL - Abnormal; Notable for the following:    Neutrophils Relative 84 (*)    Lymphocytes  Relative 11 (*)    Neutro Abs 13.5 (*)    All other components within normal limits  POTASSIUM - Abnormal; Notable for the following:    Potassium 3.4 (*)    All other components within normal limits  URINE MICROSCOPIC-ADD ON  CULTURE, BLOOD (ROUTINE X 2)  CULTURE, BLOOD (ROUTINE X 2)  MAGNESIUM  INFLUENZA PANEL BY PCR  MAGNESIUM  PREGNANCY, URINE  MAGNESIUM  MAGNESIUM        Dg Chest 2 View  12/03/2011  *RADIOLOGY REPORT*  Clinical Data: Fever and tingling  CHEST - 2 VIEW  Comparison: Chest 12/27/2008  Findings: Normal heart size and pulmonary vascularity.  No focal airspace consolidation in the lungs.  No blunting of costophrenic angles.  No pneumothorax.  No significant change since the previous study.  IMPRESSION: No evidence of active pulmonary disease.  Original Report Authenticated By: Marlon Pel, M.D.       Brief H and P: For complete details please refer to admission H and P, but in brief  [name], with known history of hyperaldosteronism, HTN, s/p LEEP procedure for CIN, s/p reduction mammoplasty 2006, s/p Cesarian section x 2, who presented to [facility] 12/03/11 with cc tingling/paresthesia in hands and feet. Pt reported that this is typical pattern of her symptoms, when she develops hypokalemia. She therefore, presented to the ED. While in the ED, she was noted to have a temperature of 100.7-102 Farenheit. She denied any symptoms of cough or dysuria, but her 55 month old son has had a temperature for the past 2 days. Chest xray and u/a in ED neg. TRH asked to admit as potassium 2.3    Hospital Course:   Principal Problem:  *Hyperaldosteronism Active Problems:  Hypokalemia  HTN (hypertension)  Fever  Leukocytosis 1. Hypoaldosteronism  Patient admitted to tele and remained NSR during hospitalization. She states has been compliant with her medications and not missed any doses. Hypokalemia improved with repletion but on day of discharge potassium  3.4 after repletion x4. Will discharge with supplements. In addition pt received 2gm magnesium x2 to keep mg level above 2. At time of discharge mg level 2.3. Pt seen by her  endocrinologist 12/04/11 and recommended spironolactone at 200mg  BID. Pt has follow up appointment with Dr Sharl Ma 12/07/11. Will need CBC and BMEt  #2 Hypokalemia  Likely secondary to #1. Patient states has been compliant with her medications. At time of discharge potassium 3.4 and mg level 2.3. Being discharged with potasium supplements as pt on spironolactone.  See #1.  #3 Fever/leukocytosis  ??etiology. CXR negative. UA negative. bld culture pending. Temp 99.2. WBC trend 16.4, 19.1, 16.1.   Patient denies cough or dysuria but report "cold". Likely stress margination. Non toxic appearing. No indication for  antibiotics at this time. Will need CBC at follow up apt 12/07/11.  #4 HTN  Fair control. Started on Norvasc this hospitalization.SBP range 148-143. DBP range 83-93. Will need close OP follow up for optimal control.    Time spent on Discharge: 35 min  Signed: Gwenyth Bender 12/05/2011, 2:24 PM

## 2011-12-05 NOTE — Discharge Instructions (Addendum)
Hypertension Hypertension is another name for high blood pressure. High blood pressure may mean that your heart needs to work harder to pump blood. Blood pressure consists of two numbers, which includes a higher number over a lower number (example: 110/72). HOME CARE   Make lifestyle changes as told by your doctor. This may include weight loss and exercise.   Take your blood pressure medicine every day.   Limit how much salt you use.   Stop smoking if you smoke.   Do not use drugs.   Talk to your doctor if you are using decongestants or birth control pills. These medicines might make blood pressure higher.   Females should not drink more than 1 alcoholic drink per day. Males should not drink more than 2 alcoholic drinks per day.   See your doctor as told.  GET HELP RIGHT AWAY IF:   You have a blood pressure reading with a top number of 180 or higher.   You get a very bad headache.   You get blurred or changing vision.   You feel confused.   You feel weak, numb, or faint.   You get chest or belly (abdominal) pain.   You throw up (vomit).   You cannot breathe very well.  MAKE SURE YOU:   Understand these instructions.   Will watch your condition.   Will get help right away if you are not doing well or get worse.  Document Released: 02/06/2008 Document Revised: 08/09/2011 Document Reviewed: 02/06/2008 [facility] Patient Information 2012 [place], [place].Hypokalemia Hypokalemia means a low potassium level in the blood. Symptoms may include muscle weakness and cramping, fatigue, abdominal pain, vomiting, constipation, or irregularities of the heartbeat. Sometimes hypokalemia is discovered by your caregiver if you are taking certain medicines for high blood pressure or kidney disease.  Potassium is an electrolyte that helps regulate the amount of fluid in the body. It also stimulates muscle contraction and maintains a stable acid-base balance. If potassium levels go too low or  too high, your health may be in danger. You are at risk for developing shock, heart, and lung problems. Hypokalemia can occur if you have excessive diarrhea, vomiting, or sweating. Potassium can be lost through your kidneys in the urine. Certain common medicines can also cause potassium loss, especially water pills (diuretics). The same is possible with cortisone medications or certain types of antibiotics. Low potassium can be dangerous if you are taking certain heart medicines. In diabetes, your potassium may fall after you take insulin, especially if your diabetes had been out of control for a while. In rare cases, potassium may be low because you are not getting enough in your diet.  In adults, a potassium level below 3.5 mEq/L is usually considered low. Hypokalemia can be treated with potassium supplements taken by mouth and a diet that is high in potassium. Foods with high potassium content are:  Peas, lentils, lima beans, nuts, and dried fruit.   Whole grain and bran cereals and breads.   Fresh fruit and vegetables. Examples include:   Bananas.   Cantaloupe.   Grapefruit.   Oranges.   Tomatoes.   Honeydew melons.   Potatoes.   Peaches.   Orange and tomato juices.   Meats.  See your caregiver as instructed for a follow-up blood test to be sure your potassium is back to normal. SEEK MEDICAL CARE IF:   You have nausea, vomiting, constipation, or abdominal pain.   You have palpitations or irregular heartbeats, chest pain or shortness  of breath.   You have muscle cramps or weakness or fatigue.   You have lethargy.  SEEK IMMEDIATE MEDICAL CARE IF:   You have paralysis.   You have confusion or other mental status changes.  Document Released: 08/20/2005 Document Revised: 08/09/2011 Document Reviewed: 12/14/2009 [facility] Patient Information 2012 [place], [place].

## 2011-12-10 LAB — CULTURE, BLOOD (ROUTINE X 2)
Culture  Setup Time: 201304020335
Culture  Setup Time: 201304020336
Culture: NO GROWTH

## 2012-03-24 ENCOUNTER — Telehealth: Payer: Self-pay | Admitting: Obstetrics and Gynecology

## 2012-03-24 NOTE — Telephone Encounter (Signed)
Pt had implanon and had it removed.  Wants to start [facility] again.  Explained that SR is OOO for 2 weeks.  Sch pt with EP Monday 05-01-12.  ld

## 2012-03-24 NOTE — Telephone Encounter (Signed)
Laura/sr pt °

## 2012-03-31 ENCOUNTER — Encounter: Payer: Self-pay | Admitting: Obstetrics and Gynecology

## 2012-04-04 ENCOUNTER — Ambulatory Visit (INDEPENDENT_AMBULATORY_CARE_PROVIDER_SITE_OTHER): Payer: BC Managed Care – PPO | Admitting: Obstetrics and Gynecology

## 2012-04-04 ENCOUNTER — Encounter: Payer: Self-pay | Admitting: Obstetrics and Gynecology

## 2012-04-04 VITALS — BP 140/90 | HR 80 | Ht 67.0 in | Wt 199.0 lb

## 2012-04-04 DIAGNOSIS — N921 Excessive and frequent menstruation with irregular cycle: Secondary | ICD-10-CM

## 2012-04-04 DIAGNOSIS — N926 Irregular menstruation, unspecified: Secondary | ICD-10-CM

## 2012-04-04 DIAGNOSIS — Z113 Encounter for screening for infections with a predominantly sexual mode of transmission: Secondary | ICD-10-CM

## 2012-04-04 LAB — CBC
Hemoglobin: 11.1 g/dL — ABNORMAL LOW (ref 12.0–15.0)
MCH: 20.3 pg — ABNORMAL LOW (ref 26.0–34.0)
Platelets: 425 10*3/uL — ABNORMAL HIGH (ref 150–400)
RBC: 5.48 MIL/uL — ABNORMAL HIGH (ref 3.87–5.11)
WBC: 7.6 10*3/uL (ref 4.0–10.5)

## 2012-04-04 NOTE — Progress Notes (Signed)
30 YO reports 2 periods in July 7 days each time with pad change 5 times a day.  Denies cramps, recent illness, vaginitis symptoms, dyspareunia, skipped periods,  or bowel changes. Patient requests STD testing.  O: UPT-negative  Neck: supple without masses Abdomen: soft, non-tender Pelvic: EGBUS-wnl, vagina-rugose, cervix-no lesions, uterus-ULNS without tenderness   A: Metrorrhagia  P: STD testing, TSH, CBC-pending      pelvic ultrasound to rule out pelvic masses       RTO-as scheduled  Onita Pfluger, PA-C

## 2012-04-04 NOTE — Progress Notes (Signed)
When did bleeding start: JULY How  Long: CYCLE X 2 IN July. PT STATES THAT PRIOR TO THIS; HER CYCLES HAVE BEEN IRREGULAR How often changing pad/tampon: 5 X IN 24 HOURS Bleeding Disorders: no Cramping: no Contraception: yes CONDOMS Fibroids: no Hormone Therapy: no New Medications: no Menopausal Symptoms: no Vag. Discharge: yes Abdominal Pain: no Increased Stress: no PT WANT STD TESTING

## 2012-04-05 LAB — HEPATITIS C ANTIBODY: HCV Ab: NEGATIVE

## 2012-04-05 LAB — GC/CHLAMYDIA PROBE AMP, GENITAL: GC Probe Amp, Genital: NEGATIVE

## 2012-04-05 LAB — TSH: TSH: 1.292 u[IU]/mL (ref 0.350–4.500)

## 2012-04-05 LAB — HEPATITIS B SURFACE ANTIGEN: Hepatitis B Surface Ag: NEGATIVE

## 2012-04-07 LAB — HSV 2 ANTIBODY, IGG: HSV 2 Glycoprotein G Ab, IgG: 7.7 IV — ABNORMAL HIGH

## 2012-04-10 ENCOUNTER — Encounter: Payer: BC Managed Care – PPO | Admitting: Obstetrics and Gynecology

## 2012-04-10 ENCOUNTER — Other Ambulatory Visit: Payer: BC Managed Care – PPO

## 2012-05-01 ENCOUNTER — Other Ambulatory Visit: Payer: BC Managed Care – PPO

## 2012-05-01 ENCOUNTER — Encounter: Payer: BC Managed Care – PPO | Admitting: Obstetrics and Gynecology

## 2012-05-09 ENCOUNTER — Encounter: Payer: Self-pay | Admitting: Obstetrics and Gynecology

## 2012-05-20 ENCOUNTER — Telehealth: Payer: Self-pay | Admitting: Obstetrics and Gynecology

## 2012-05-20 DIAGNOSIS — R1900 Intra-abdominal and pelvic swelling, mass and lump, unspecified site: Secondary | ICD-10-CM

## 2012-05-22 ENCOUNTER — Telehealth: Payer: Self-pay | Admitting: Obstetrics and Gynecology

## 2012-05-23 ENCOUNTER — Encounter: Payer: BC Managed Care – PPO | Admitting: Obstetrics and Gynecology

## 2012-05-23 ENCOUNTER — Telehealth: Payer: Self-pay

## 2012-05-23 NOTE — Telephone Encounter (Signed)
Pt thinks she has a bacterial infection and would like to be seen today. Advised pt that first available appointment is 06/02/12, pt states that is too long for her to wait and that she will get an appointment with urgent care center. Advised pt to call back if there is anything else we can help her with. Pt voiced understanding.

## 2012-05-27 NOTE — Telephone Encounter (Signed)
Pt wants to f/u with SR regarding fibroid vs mass.  Saw EP and was to have u/s.  Pt missed appt.  Pt is now sch for u/s with f/u SR.  Pt prefers to see SR instead of EP.  U/s ordered per EP note.  ld

## 2012-05-28 ENCOUNTER — Encounter: Payer: Self-pay | Admitting: Obstetrics and Gynecology

## 2012-06-17 ENCOUNTER — Encounter: Payer: Self-pay | Admitting: Obstetrics and Gynecology

## 2012-06-17 ENCOUNTER — Ambulatory Visit (INDEPENDENT_AMBULATORY_CARE_PROVIDER_SITE_OTHER): Payer: BC Managed Care – PPO

## 2012-06-17 ENCOUNTER — Ambulatory Visit (INDEPENDENT_AMBULATORY_CARE_PROVIDER_SITE_OTHER): Payer: BC Managed Care – PPO | Admitting: Obstetrics and Gynecology

## 2012-06-17 VITALS — BP 130/82 | Wt 201.0 lb

## 2012-06-17 DIAGNOSIS — N898 Other specified noninflammatory disorders of vagina: Secondary | ICD-10-CM

## 2012-06-17 DIAGNOSIS — N938 Other specified abnormal uterine and vaginal bleeding: Secondary | ICD-10-CM

## 2012-06-17 DIAGNOSIS — N949 Unspecified condition associated with female genital organs and menstrual cycle: Secondary | ICD-10-CM

## 2012-06-17 DIAGNOSIS — N926 Irregular menstruation, unspecified: Secondary | ICD-10-CM

## 2012-06-17 MED ORDER — SPIRONOLACTONE 100 MG PO TABS: 200.0000 mg | ORAL_TABLET | Freq: Two times a day (BID) | ORAL | Status: DC

## 2012-06-17 NOTE — Progress Notes (Signed)
Subjective:    Krista [name] is a 31 y.o. female, [phone], who presents for Gyn ultrasound because of metrorrhagia .pt stated been having a slight discharge wants to be retested today for trich.   Was seen by EP for:     30 YO reports 2 periods in July 7 days each time with pad change 5 times a day. Denies cramps, recent illness, vaginitis symptoms, dyspareunia, skipped periods, or bowel changes    The following portions of the patient's history were reviewed and updated as appropriate: allergies, current medications, past family history.  Objective:    BP 130/82  Wt 201 lb (91.173 kg)  LMP 06/03/2012  Breastfeeding? Yes    Weight:  Wt Readings from Last 1 Encounters:  06/17/12 201 lb (91.173 kg)          BMI: There is no height on file to calculate BMI.  ULTRASOUND: Uterus AV     Adnexa normal    Endometrium 6.67 mm    Free fluid: no    Other findings:  L ovarian simple cyst @ 2.43 cm c/w follicle OSOM trichomonas: negative  Assessment:    Dysfunctional uterine bleeding  now resolved   Plan:    obervation AEX 09/2012  Silverio Lay MD

## 2012-06-18 ENCOUNTER — Other Ambulatory Visit: Payer: Self-pay | Admitting: Obstetrics and Gynecology

## 2012-09-01 ENCOUNTER — Encounter (HOSPITAL_COMMUNITY): Payer: Self-pay | Admitting: *Deleted

## 2012-09-01 ENCOUNTER — Emergency Department (HOSPITAL_COMMUNITY)
Admission: EM | Admit: 2012-09-01 | Discharge: 2012-09-01 | Disposition: A | Discharge status: Home / Self Care | Payer: BC Managed Care – PPO | Attending: Emergency Medicine | Admitting: Emergency Medicine

## 2012-09-01 DIAGNOSIS — Z8619 Personal history of other infectious and parasitic diseases: Secondary | ICD-10-CM | POA: Insufficient documentation

## 2012-09-01 DIAGNOSIS — I10 Essential (primary) hypertension: Secondary | ICD-10-CM | POA: Insufficient documentation

## 2012-09-01 DIAGNOSIS — R209 Unspecified disturbances of skin sensation: Secondary | ICD-10-CM | POA: Insufficient documentation

## 2012-09-01 DIAGNOSIS — E269 Hyperaldosteronism, unspecified: Secondary | ICD-10-CM | POA: Insufficient documentation

## 2012-09-01 DIAGNOSIS — Z8742 Personal history of other diseases of the female genital tract: Secondary | ICD-10-CM | POA: Insufficient documentation

## 2012-09-01 DIAGNOSIS — E876 Hypokalemia: Secondary | ICD-10-CM

## 2012-09-01 DIAGNOSIS — Z8679 Personal history of other diseases of the circulatory system: Secondary | ICD-10-CM | POA: Insufficient documentation

## 2012-09-01 DIAGNOSIS — Z87440 Personal history of urinary (tract) infections: Secondary | ICD-10-CM | POA: Insufficient documentation

## 2012-09-01 DIAGNOSIS — Z3202 Encounter for pregnancy test, result negative: Secondary | ICD-10-CM | POA: Insufficient documentation

## 2012-09-01 DIAGNOSIS — Z862 Personal history of diseases of the blood and blood-forming organs and certain disorders involving the immune mechanism: Secondary | ICD-10-CM | POA: Insufficient documentation

## 2012-09-01 DIAGNOSIS — R1110 Vomiting, unspecified: Secondary | ICD-10-CM | POA: Insufficient documentation

## 2012-09-01 DIAGNOSIS — E119 Type 2 diabetes mellitus without complications: Secondary | ICD-10-CM | POA: Insufficient documentation

## 2012-09-01 LAB — POCT I-STAT, CHEM 8
Chloride: 103 mEq/L (ref 96–112)
Glucose, Bld: 97 mg/dL (ref 70–99)
HCT: 38 % (ref 36.0–46.0)
Hemoglobin: 12.9 g/dL (ref 12.0–15.0)
Potassium: 2.8 mEq/L — ABNORMAL LOW (ref 3.5–5.1)
Sodium: 144 mEq/L (ref 135–145)

## 2012-09-01 LAB — POCT PREGNANCY, URINE: Preg Test, Ur: NEGATIVE

## 2012-09-01 MED ORDER — POTASSIUM CHLORIDE CRYS ER 20 MEQ PO TBCR
40.0000 meq | EXTENDED_RELEASE_TABLET | Freq: Once | ORAL | Status: AC
  Administered 2012-09-01: 40 meq via ORAL
  Filled 2012-09-01: qty 2

## 2012-09-01 MED ORDER — POTASSIUM CHLORIDE CRYS ER 20 MEQ PO TBCR: 20.0000 meq | EXTENDED_RELEASE_TABLET | Freq: Every day | ORAL | Status: DC

## 2012-09-01 MED ORDER — MAGNESIUM CHLORIDE 64 MG PO TBEC
2.0000 | DELAYED_RELEASE_TABLET | Freq: Once | ORAL | Status: AC
  Administered 2012-09-01: 128 mg via ORAL
  Filled 2012-09-01: qty 2

## 2012-09-01 MED ORDER — SPIRONOLACTONE 100 MG PO TABS: 200.0000 mg | ORAL_TABLET | Freq: Two times a day (BID) | ORAL | Status: DC

## 2012-09-01 MED ORDER — FENTANYL CITRATE 0.05 MG/ML IJ SOLN
100.0000 ug | Freq: Once | INTRAMUSCULAR | Status: AC
  Administered 2012-09-01: 100 ug via NASAL
  Filled 2012-09-01: qty 2

## 2012-09-01 MED ORDER — MAGNESIUM CHLORIDE 64 MG PO TBEC: 2.0000 | DELAYED_RELEASE_TABLET | Freq: Two times a day (BID) | ORAL | Status: DC

## 2012-09-01 NOTE — ED Provider Notes (Signed)
History     CSN: [id number]  Arrival date & time 09/01/12  1648   First MD Initiated Contact with Patient 09/01/12 1858      Chief Complaint  Patient presents with  . hypokalemia   . Numbness    feet/hands  . Headache  . Emesis    (Consider location/radiation/quality/duration/timing/severity/associated sxs/prior treatment) HPI This 31 year old female has a history of hypertension, hypoaldosteronism, and hypokalemia ran out of her blood pressure medications a couple weeks ago. Over the last week or more she is gradual onset of a constant mild headache, constant mild numbness and tingling of both hands and both feet like in the past when she has had low potassium level, and she had a couple hours yesterday of nausea and vomiting without abdominal pain diarrhea fevers chills or recurrent symptoms, she is no nausea or vomiting today, she is no sudden headache, no severe headache, she has had no change in speech or vision swallowing or understanding, she has no weakness or incoordination, she only has mild numbness to both hands and both feet but the rest of her body feels fine, she is no change in bowel or bladder control, she is no abdominal pain vaginal bleeding or vaginal discharge or dysuria. She is no trauma. She just wants to get her blood pressure medications refilled. She took some ibuprofen today for her headache and partially helps. Past Medical History  Diagnosis Date  . Hypokalemia 2007    IP Sept 2012  . Hypertension   . Primary aldosteronism   . Migraine headache   . Scoliosis   . Thalassemia minor   . Aldosteronism 2012  . H/O thalassemia   . Migraine   . H/O varicella   . H/O candidiasis   . Abnormal Pap smear 2010    Colpo & LEEP  . Hx: UTI (urinary tract infection) 2010  . Sickle cell trait   . Diabetes mellitus   . Yeast infection   . H/O bacterial infection   . Dermatitis 2007  . BV (bacterial vaginosis) 2008  . Monilial vaginitis 2008  . Yeast vaginitis  2010  . CIN I (cervical intraepithelial neoplasia I) 2010  . Postpartum hypertension 07/06/10    Past Surgical History  Procedure Date  . Breast reduction surgery Jan 2006  . Cesarean section 2005 2011  . Dilation and curettage of uterus 2003  . Wisdom tooth extraction     Family History  Problem Relation Age of Onset  . Thalassemia Mother   . Hypertension Father   . Heart attack Father   . Heart disease Father     MI  . Breast cancer Maternal Aunt   . Breast cancer Maternal Grandmother   . Cancer Maternal Grandfather     Bone    History  Substance Use Topics  . Smoking status: Never Smoker   . Smokeless tobacco: Never Used  . Alcohol Use: Yes    OB History    Grav Para Term Preterm Abortions TAB SAB Ect Mult Living   7 2 1  4 1 3  1 3       Review of Systems 10 Systems reviewed and are negative for acute change except as noted in the HPI. Allergies  Sulfa antibiotics  Home Medications   Current Outpatient Rx  Name  Route  Sig  Dispense  Refill  . IBUPROFEN 200 MG PO TABS   Oral   Take 200 mg by mouth every 8 (eight) hours as needed. For pain.         Marland Kitchen  MAGNESIUM CHLORIDE 64 MG PO TBEC   Oral   Take 2 tablets (128 mg total) by mouth 2 (two) times daily.   8 tablet   0   . POTASSIUM CHLORIDE CRYS ER 20 MEQ PO TBCR   Oral   Take 1 tablet (20 mEq total) by mouth daily. Take 1 tabs po daily x 2 days   2 tablet   0   . SPIRONOLACTONE 100 MG PO TABS   Oral   Take 2 tablets (200 mg total) by mouth 2 (two) times daily.   60 tablet   0     BP 164/99  Pulse 84  Temp 98.9 F (37.2 C) (Oral)  Resp 18  SpO2 100%  LMP 08/17/2012  Physical Exam  Nursing note and vitals reviewed. Constitutional:       Awake, alert, nontoxic appearance with baseline speech for patient.  HENT:  Head: Atraumatic.  Mouth/Throat: No oropharyngeal exudate.  Eyes: EOM are normal. Pupils are equal, round, and reactive to light. Right eye exhibits no discharge. Left eye  exhibits no discharge.  Neck: Neck supple.  Cardiovascular: Normal rate and regular rhythm.   No murmur heard. Pulmonary/Chest: Effort normal and breath sounds normal. No stridor. No respiratory distress. She has no wheezes. She has no rales. She exhibits no tenderness.  Abdominal: Soft. Bowel sounds are normal. She exhibits no mass. There is no tenderness. There is no rebound.  Musculoskeletal: She exhibits no tenderness.       Baseline ROM, moves extremities with no obvious new focal weakness.  Lymphadenopathy:    She has no cervical adenopathy.  Neurological:       Awake, alert, cooperative and aware of situation; motor strength bilaterally; sensation normal to light touch bilaterally; peripheral visual fields full to confrontation; no facial asymmetry; tongue midline; major cranial nerves appear intact; no pronator drift, normal finger to nose bilaterally, baseline gait without new ataxia.  Skin: No rash noted.  Psychiatric: She has a normal mood and affect.    ED Course  Procedures (including critical care time)  Labs Reviewed  POCT I-STAT, CHEM 8 - Abnormal; Notable for the following:    Potassium 2.8 (*)     All other components within normal limits  POCT PREGNANCY, URINE  LAB REPORT - SCANNED   No results found.   1. HTN (hypertension)   2. Hypokalemia       MDM  Patient / Family / Caregiver informed of clinical course, understand medical decision-making process, and agree with plan.I doubt any other EMC precluding discharge at this time including, but not necessarily limited to the following:SAH, CVA, SBI, HTN crisis.        Hurman Horn, MD 09/02/12 [id number]

## 2012-09-01 NOTE — ED Notes (Signed)
[id number]<JY> Expected date:<BR> Expected time:<BR> Means of arrival:<BR> Comments:<BR> Triage

## 2012-09-01 NOTE — ED Notes (Signed)
The past 2 weeks pt has not been taking her blood pressure medication, first time she took it was today, states been having headaches, nausea and vomiting on and off over those 2 weeks, pt complaining of headache, numbness in feet/hands that started today. Denies blurred vision.

## 2012-09-15 ENCOUNTER — Other Ambulatory Visit (HOSPITAL_COMMUNITY)
Admission: RE | Admit: 2012-09-15 | Discharge: 2012-09-15 | Disposition: A | Discharge status: Home / Self Care | Payer: BC Managed Care – PPO | Source: Ambulatory Visit | Attending: Emergency Medicine | Admitting: Emergency Medicine

## 2012-09-15 ENCOUNTER — Encounter (HOSPITAL_COMMUNITY): Payer: Self-pay | Admitting: Emergency Medicine

## 2012-09-15 ENCOUNTER — Emergency Department (HOSPITAL_COMMUNITY)
Admission: EM | Admit: 2012-09-15 | Discharge: 2012-09-15 | Disposition: A | Discharge status: Home / Self Care | Payer: BC Managed Care – PPO | Source: Home / Self Care

## 2012-09-15 DIAGNOSIS — Z113 Encounter for screening for infections with a predominantly sexual mode of transmission: Secondary | ICD-10-CM | POA: Insufficient documentation

## 2012-09-15 DIAGNOSIS — N760 Acute vaginitis: Secondary | ICD-10-CM | POA: Insufficient documentation

## 2012-09-15 DIAGNOSIS — I10 Essential (primary) hypertension: Secondary | ICD-10-CM

## 2012-09-15 DIAGNOSIS — Z3490 Encounter for supervision of normal pregnancy, unspecified, unspecified trimester: Secondary | ICD-10-CM

## 2012-09-15 DIAGNOSIS — B9689 Other specified bacterial agents as the cause of diseases classified elsewhere: Secondary | ICD-10-CM

## 2012-09-15 DIAGNOSIS — Z3201 Encounter for pregnancy test, result positive: Secondary | ICD-10-CM

## 2012-09-15 DIAGNOSIS — A499 Bacterial infection, unspecified: Secondary | ICD-10-CM

## 2012-09-15 LAB — POCT PREGNANCY, URINE: Preg Test, Ur: POSITIVE — AB

## 2012-09-15 MED ORDER — METHYLDOPA 250 MG PO TABS: 250.0000 mg | ORAL_TABLET | Freq: Three times a day (TID) | ORAL | Status: DC

## 2012-09-15 MED ORDER — PRENATAL VITAMINS PLUS 27-1 MG PO TABS: 1.0000 | ORAL_TABLET | Freq: Every day | ORAL | Status: DC

## 2012-09-15 MED ORDER — METRONIDAZOLE 500 MG PO TABS: 500.0000 mg | ORAL_TABLET | Freq: Two times a day (BID) | ORAL | Status: DC

## 2012-09-15 NOTE — ED Provider Notes (Signed)
History     CSN: [id number]  Arrival date & time 09/15/12  1741   First MD Initiated Contact with Patient 09/15/12 1902      Chief Complaint  Patient presents with  . Vaginal Itching    vaginal discharge with odor x 4 days. no concerns for stds. would like preg. test.     (Consider location/radiation/quality/duration/timing/severity/associated sxs/prior treatment) HPI Comments: 32 year old female presents with vaginal discharge and odor for several days. Is also having vaginal itching. She denies chest pain shortness of breath abdominal pain, pelvic pain or urinary symptoms. She states " I get these all the time". She is also requesting a pregnancy test LMP 08/17/2012.   Past Medical History  Diagnosis Date  . Hypokalemia 2007    IP Sept 2012  . Hypertension   . Primary aldosteronism   . Migraine headache   . Scoliosis   . Thalassemia minor   . Aldosteronism 2012  . H/O thalassemia   . Migraine   . H/O varicella   . H/O candidiasis   . Abnormal Pap smear 2010    Colpo & LEEP  . Hx: UTI (urinary tract infection) 2010  . Sickle cell trait   . Diabetes mellitus   . Yeast infection   . H/O bacterial infection   . Dermatitis 2007  . BV (bacterial vaginosis) 2008  . Monilial vaginitis 2008  . Yeast vaginitis 2010  . CIN I (cervical intraepithelial neoplasia I) 2010  . Postpartum hypertension 07/06/10    Past Surgical History  Procedure Date  . Breast reduction surgery Jan 2006  . Cesarean section 2005 2011  . Dilation and curettage of uterus 2003  . Wisdom tooth extraction     Family History  Problem Relation Age of Onset  . Thalassemia Mother   . Hypertension Father   . Heart attack Father   . Heart disease Father     MI  . Breast cancer Maternal Aunt   . Breast cancer Maternal Grandmother   . Cancer Maternal Grandfather     Bone    History  Substance Use Topics  . Smoking status: Never Smoker   . Smokeless tobacco: Never Used  . Alcohol Use: Yes     OB History    Grav Para Term Preterm Abortions TAB SAB Ect Mult Living   7 2 1  4 1 3  1 3       Review of Systems  All other systems reviewed and are negative.    Allergies  Sulfa antibiotics  Home Medications   Current Outpatient Rx  Name  Route  Sig  Dispense  Refill  . IBUPROFEN 200 MG PO TABS   Oral   Take 200 mg by mouth every 8 (eight) hours as needed. For pain.         Marland Kitchen MAGNESIUM CHLORIDE 64 MG PO TBEC   Oral   Take 2 tablets (128 mg total) by mouth 2 (two) times daily.   8 tablet   0   . METHYLDOPA 250 MG PO TABS   Oral   Take 1 tablet (250 mg total) by mouth 3 (three) times daily.   90 tablet   0   . METRONIDAZOLE 500 MG PO TABS   Oral   Take 1 tablet (500 mg total) by mouth 2 (two) times daily. X 7 days   14 tablet   0   . POTASSIUM CHLORIDE CRYS ER 20 MEQ PO TBCR   Oral   Take 1  tablet (20 mEq total) by mouth daily. Take 1 tabs po daily x 2 days   2 tablet   0   . PRENATAL VITAMINS PLUS 27-1 MG PO TABS   Oral   Take 1 tablet by mouth daily. 1 tab po once daily   30 tablet   0     BP 208/124  Pulse 84  Temp 98.3 F (36.8 C) (Oral)  Resp 16  SpO2 100%  LMP 08/17/2012  Physical Exam  Constitutional: She is oriented to person, place, and time. She appears well-developed and well-nourished. No distress.  Eyes: EOM are normal.  Neck: Normal range of motion. Neck supple.  Pulmonary/Chest: Effort normal.  Genitourinary: Vaginal discharge found.       Normal external female genitalia. Malodorous vaginal discharge scant and brown Cervix is midline and the cervix without lesions and pain. Os is friable with some bleeding after cleaning with a Q-tip. Bimanual no CMT no adnexal tenderness  Neurological: She is alert and oriented to person, place, and time.  Skin: Skin is warm and dry.  Psychiatric: She has a normal mood and affect.    ED Course  Procedures (including critical care time)  Labs Reviewed  POCT PREGNANCY, URINE -  Abnormal; Notable for the following:    Preg Test, Ur POSITIVE (*)     All other components within normal limits  CERVICOVAGINAL ANCILLARY ONLY   No results found.   1. BV (bacterial vaginosis)   2. HTN (hypertension), malignant   3. Pregnancy       MDM  Stop the Spironolactone Start Aldomet 250 mg 3 times a day See your doctor this week call for an appointment Prenatal vitamins as directed Metronidazole 500 mg twice a day for 7 days For any bleeding or pelvic pain followup with the [facility]        Hayden Rasmussen, NP 09/15/12 1932

## 2012-09-15 NOTE — ED Notes (Signed)
Pt c/o vaginal irritation with discharge and odor. Pt has no concerns of std's. Pt request preg testing. "think I might be pregnant".  Pt denies pelvic or abdominal pain.

## 2012-09-15 NOTE — ED Notes (Signed)
Waiting discharge papers 

## 2012-09-18 NOTE — ED Notes (Signed)
GC/Chlamydia neg., Affirm: Candida and Trich neg., Gardnerella pos. Pt. adequately treated with Flagyl. Vassie Moselle 09/18/2012

## 2012-09-19 ENCOUNTER — Inpatient Hospital Stay (HOSPITAL_COMMUNITY)
Admission: AD | Admit: 2012-09-19 | Discharge: 2012-09-19 | Disposition: A | Discharge status: Home / Self Care | Payer: BC Managed Care – PPO | Source: Ambulatory Visit | Attending: Obstetrics and Gynecology | Admitting: Obstetrics and Gynecology

## 2012-09-19 ENCOUNTER — Encounter (HOSPITAL_COMMUNITY): Payer: Self-pay | Admitting: Family

## 2012-09-19 ENCOUNTER — Telehealth: Payer: Self-pay | Admitting: Obstetrics and Gynecology

## 2012-09-19 ENCOUNTER — Other Ambulatory Visit: Payer: Self-pay | Admitting: Obstetrics and Gynecology

## 2012-09-19 ENCOUNTER — Inpatient Hospital Stay (HOSPITAL_COMMUNITY): Payer: BC Managed Care – PPO

## 2012-09-19 DIAGNOSIS — O10019 Pre-existing essential hypertension complicating pregnancy, unspecified trimester: Secondary | ICD-10-CM | POA: Insufficient documentation

## 2012-09-19 DIAGNOSIS — O3440 Maternal care for other abnormalities of cervix, unspecified trimester: Secondary | ICD-10-CM | POA: Diagnosis present

## 2012-09-19 DIAGNOSIS — O039 Complete or unspecified spontaneous abortion without complication: Secondary | ICD-10-CM

## 2012-09-19 DIAGNOSIS — Z9889 Other specified postprocedural states: Secondary | ICD-10-CM

## 2012-09-19 DIAGNOSIS — E876 Hypokalemia: Secondary | ICD-10-CM | POA: Insufficient documentation

## 2012-09-19 DIAGNOSIS — Z98891 History of uterine scar from previous surgery: Secondary | ICD-10-CM

## 2012-09-19 DIAGNOSIS — E269 Hyperaldosteronism, unspecified: Secondary | ICD-10-CM | POA: Insufficient documentation

## 2012-09-19 DIAGNOSIS — D649 Anemia, unspecified: Secondary | ICD-10-CM | POA: Insufficient documentation

## 2012-09-19 DIAGNOSIS — O99019 Anemia complicating pregnancy, unspecified trimester: Secondary | ICD-10-CM | POA: Insufficient documentation

## 2012-09-19 DIAGNOSIS — D573 Sickle-cell trait: Secondary | ICD-10-CM | POA: Diagnosis present

## 2012-09-19 DIAGNOSIS — O34219 Maternal care for unspecified type scar from previous cesarean delivery: Secondary | ICD-10-CM | POA: Insufficient documentation

## 2012-09-19 HISTORY — DX: Complete or unspecified spontaneous abortion without complication: O03.9

## 2012-09-19 LAB — COMPREHENSIVE METABOLIC PANEL
AST: 16 U/L (ref 0–37)
BUN: 10 mg/dL (ref 6–23)
CO2: 29 mEq/L (ref 19–32)
Chloride: 99 mEq/L (ref 96–112)
Creatinine, Ser: 0.67 mg/dL (ref 0.50–1.10)
GFR calc non Af Amer: >90 mL/min (ref >90)
Glucose, Bld: 91 mg/dL (ref 70–99)
Potassium: 2.7 mEq/L — CL (ref 3.5–5.1)
Sodium: 138 mEq/L (ref 135–145)
Total Bilirubin: 0.2 mg/dL — ABNORMAL LOW (ref 0.3–1.2)

## 2012-09-19 LAB — CBC
MCH: 20.4 pg — ABNORMAL LOW (ref 26.0–34.0)
MCHC: 31.2 g/dL (ref 30.0–36.0)
RDW: 15.3 % (ref 11.5–15.5)

## 2012-09-19 LAB — URIC ACID: Uric Acid, Serum: 3.8 mg/dL (ref 2.4–7.0)

## 2012-09-19 MED ORDER — LABETALOL HCL 200 MG PO TABS: 400.0000 mg | ORAL_TABLET | Freq: Two times a day (BID) | ORAL | Status: DC

## 2012-09-19 MED ORDER — METHYLDOPA 250 MG PO TABS: 250.0000 mg | ORAL_TABLET | Freq: Three times a day (TID) | ORAL | Status: DC

## 2012-09-19 MED ORDER — METHYLDOPA 250 MG PO TABS
250.0000 mg | ORAL_TABLET | Freq: Three times a day (TID) | ORAL | Status: DC
  Administered 2012-09-19: 250 mg via ORAL
  Filled 2012-09-19 (×2): qty 1

## 2012-09-19 MED ORDER — POTASSIUM CHLORIDE CRYS ER 20 MEQ PO TBCR: 40.0000 meq | EXTENDED_RELEASE_TABLET | Freq: Two times a day (BID) | ORAL | Status: DC

## 2012-09-19 MED ORDER — LABETALOL HCL 200 MG PO TABS
400.0000 mg | ORAL_TABLET | Freq: Two times a day (BID) | ORAL | Status: DC
  Administered 2012-09-19: 400 mg via ORAL
  Filled 2012-09-19 (×2): qty 2

## 2012-09-19 NOTE — Telephone Encounter (Signed)
Tc to pt per telephone call. Pt to go MAU for eval of BP. Pt agrees.

## 2012-09-19 NOTE — Telephone Encounter (Signed)
Tc to pt per telephone call. Pt with positive UPT on 09/15/12. LMP-08/27/13. Pt taking Spiralactione for HBP pres by Dr. Margaretmary Lombard pt d/c medicine x 1 wk ago due to preg. Pt with BP today of 204/118. No ha's, dizziness, or visual changes. No vag bld or abd pain. No fever. Will consult with provider per recs. Pt agrees.

## 2012-09-19 NOTE — MAU Provider Note (Signed)
History   32 yo [phone] (hx of 1 twin delivery) at unknown gestation, but likely 1st trimester, presented with elevated BP of 200/? At [facility].  Hx of chronic hypertension and hyperaldosteronism--on spironolactone prior to pregnancy, but stopped with + UPT.  Seen at [facility] ER on Monday of this week due to tingling around mouth--K+ 2.8.  Started on Aldomet 250 mg po TID that day, and has been taking it as Rx'd.  On K+ po supplement.    Followed by Dr. Sharl Ma, endocrinologist, but has not had a recent appt.  LMP 08/17/12, but hx of irregular cycles--last cycle shorter than usual.  Denies HA, visual sx, chest pain, SOB, or any other sx.  Mild nausea, but no significant N/V.  Hx previous C/S x 2, with a twins delivered at 36-37 weeks in 2012 with [facility].  By 36 weeks, patient was on Aldomet 750 mg po TID and Labetalol 700 mg po TID.  Patient Active Problem List  Diagnosis  . Hyperaldosteronism  . Hypokalemia  . HTN (hypertension)  . Leukocytosis  . H/O cesarean section x 2  . SAB (spontaneous abortion) x 3, TAB x 2  . H/O LEEP (loop electrosurgical excision procedure) of cervix complicating pregnancy  . H/O bilateral breast reduction surgery  . Sickle cell trait  . Thalassemia minor      Chief Complaint  Patient presents with  . Hypertension     OB History    Grav Para Term Preterm Abortions TAB SAB Ect Mult Living   8 2 1  0 5 2 3  1 3       Past Medical History  Diagnosis Date  . Hypokalemia 2007    IP Sept 2012  . Hypertension   . Primary aldosteronism   . Migraine headache   . Scoliosis   . Thalassemia minor   . Aldosteronism 2012  . H/O thalassemia   . Migraine   . H/O varicella   . H/O candidiasis   . Abnormal Pap smear 2010    Colpo & LEEP  . Hx: UTI (urinary tract infection) 2010  . Sickle cell trait   . Diabetes mellitus   . Yeast infection   . H/O bacterial infection   . Dermatitis 2007  . BV (bacterial vaginosis) 2008  . Monilial vaginitis 2008  . Yeast  vaginitis 2010  . CIN I (cervical intraepithelial neoplasia I) 2010  . Postpartum hypertension 07/06/10    Past Surgical History  Procedure Date  . Breast reduction surgery Jan 2006  . Cesarean section 2005 2011  . Dilation and curettage of uterus 2003  . Wisdom tooth extraction     Family History  Problem Relation Age of Onset  . Thalassemia Mother   . Hypertension Father   . Heart attack Father   . Heart disease Father     MI  . Breast cancer Maternal Aunt   . Breast cancer Maternal Grandmother   . Cancer Maternal Grandfather     Bone    History  Substance Use Topics  . Smoking status: Never Smoker   . Smokeless tobacco: Never Used  . Alcohol Use: Yes    Allergies:  Allergies  Allergen Reactions  . Sulfa Antibiotics Other (See Comments)    Eyes turn blood shot red    Prescriptions prior to admission  Medication Sig Dispense Refill  . magnesium chloride (SLOW-MAG) 64 MG TBEC Take 2 tablets (128 mg total) by mouth 2 (two) times daily.  8 tablet  0  .  methyldopa (ALDOMET) 250 MG tablet Take 1 tablet (250 mg total) by mouth 3 (three) times daily.  90 tablet  0  . metroNIDAZOLE (FLAGYL) 500 MG tablet Take 1 tablet (500 mg total) by mouth 2 (two) times daily. X 7 days  14 tablet  0  . potassium chloride SA (K-DUR,KLOR-CON) 20 MEQ tablet Take 1 tablet (20 mEq total) by mouth daily. Take 1 tabs po daily x 2 days  2 tablet  0  . Prenatal Vit-Fe Fumarate-FA (PRENATAL VITAMINS PLUS) 27-1 MG TABS Take 1 tablet by mouth daily. 1 tab po once daily  30 tablet  0  . [DISCONTINUED] ibuprofen (ADVIL,MOTRIN) 200 MG tablet Take 200 mg by mouth every 8 (eight) hours as needed. For pain.         Physical Exam   Blood pressure 174/96, pulse 88, temperature 98.6 F (37 C), temperature source Oral, resp. rate 18, height 5\' 7"  (1.702 m), weight 199 lb (90.266 kg), last menstrual period 08/17/2012, SpO2 100.00%.  Filed Vitals:   09/19/12 1830 09/19/12 1845 09/19/12 1900 09/19/12 1930    BP: 169/99 170/72 164/103 174/96  Pulse: 86 88 86 88  Temp:      TempSrc:      Resp:      Height:      Weight:      SpO2:       Results for orders placed during the hospital encounter of 09/19/12 (from the past 24 hour(s))  POCT PREGNANCY, URINE     Status: Abnormal   Collection Time   09/19/12  6:27 PM      Component Value Range   Preg Test, Ur POSITIVE (*) NEGATIVE  CBC     Status: Abnormal   Collection Time   09/19/12  6:30 PM      Component Value Range   WBC 9.4  4.0 - 10.5 K/uL   RBC 4.70  3.87 - 5.11 MIL/uL   Hemoglobin 9.6 (*) 12.0 - 15.0 g/dL   HCT 16.1 (*) 09.6 - 04.5 %   MCV 65.5 (*) 78.0 - 100.0 fL   MCH 20.4 (*) 26.0 - 34.0 pg   MCHC 31.2  30.0 - 36.0 g/dL   RDW 40.9  81.1 - 91.4 %   Platelets 326  150 - 400 K/uL  COMPREHENSIVE METABOLIC PANEL     Status: Abnormal   Collection Time   09/19/12  6:30 PM      Component Value Range   Sodium 138  135 - 145 mEq/L   Potassium 2.7 (*) 3.5 - 5.1 mEq/L   Chloride 99  96 - 112 mEq/L   CO2 29  19 - 32 mEq/L   Glucose, Bld 91  70 - 99 mg/dL   BUN 10  6 - 23 mg/dL   Creatinine, Ser 7.82  0.50 - 1.10 mg/dL   Calcium 8.6  8.4 - 95.6 mg/dL   Total Protein 6.9  6.0 - 8.3 g/dL   Albumin 3.4 (*) 3.5 - 5.2 g/dL   AST 16  0 - 37 U/L   ALT 12  0 - 35 U/L   Alkaline Phosphatase 74  39 - 117 U/L   Total Bilirubin 0.2 (*) 0.3 - 1.2 mg/dL   GFR calc non Af Amer >90  >90 mL/min   GFR calc Af Amer >90  >90 mL/min  LACTATE DEHYDROGENASE     Status: Normal   Collection Time   09/19/12  6:30 PM      Component  Value Range   LDH 200  94 - 250 U/L  URIC ACID     Status: Normal   Collection Time   09/19/12  6:30 PM      Component Value Range   Uric Acid, Serum 3.8  2.4 - 7.0 mg/dL  HCG, QUANTITATIVE, PREGNANCY     Status: Abnormal   Collection Time   09/19/12  6:30 PM      Component Value Range   hCG, Beta Chain, Quant, S 6869 (*) <5 mIU/mL     ED Course  Early pregnancy Chronic  hypertension Hyperaldosteronism Hypokalemia Anemia Previous C/S x 2 Hx 3 SABs, 2 TABs Hx LEEP  Plan: Consulted with Dr. Su Hilt Continue Aldomet 250 mg po TID (missed mid-day dose--will give dose now). Add Labetalol 400 mg po BID--1st dose now [place] for dating--await results. K+ replacement--patient declines IV infusion.  Plan Kdur 40 meq BID x 1 week, then needs K+ rechecked.  Will likely need 40 meq po q day. Patient will contact Dr. Sharl Ma for appt within the next week for review of status and plan for treatment of hypertension and hypokalemia.  She feels she can contact his office and get the needed appt.     Nigel Bridgeman CNM, [place] 09/19/2012 7:51 PM

## 2012-09-19 NOTE — MAU Note (Addendum)
Patient reports she has hyperaldosteronism and is presribed Spirolactone. Found out Monday she was pregnant. Was prescribed Aldomet,  250 mg three x per day by Urgent Care provider.  Today when she was at [facility],  she noticed tingling around nose, mouth and hands; has problems with hypokalemia d/t hyperaldosterone.  Took  her BP, 204/118. Called RN on call at doctor's office and was told to come in.  Denies headache, visual disturbances.

## 2012-09-19 NOTE — Telephone Encounter (Signed)
Pt just found out she is pregnant and needs rx for bp.

## 2012-09-20 NOTE — ED Provider Notes (Signed)
Medical screening examination/treatment/procedure(s) were performed by resident physician or non-physician practitioner and as supervising physician I was immediately available for consultation/collaboration.   Joycelyn Liska DOUGLAS MD.    Ki Luckman D Lizvette Lightsey, MD 09/20/12 1722 

## 2012-09-22 ENCOUNTER — Telehealth: Payer: Self-pay | Admitting: Obstetrics and Gynecology

## 2012-09-22 NOTE — Telephone Encounter (Signed)
Pt scheduled for Friday 09/26/12, [place] @ 1130, f/u w/ VL @ 1200.  Appt date and time will be mailed to pt's home, vm box is full per [facility].

## 2012-09-22 NOTE — Telephone Encounter (Signed)
Message copied by Delon Sacramento on Mon Sep 22, 2012 10:37 AM ------      Message from: Cornelius Moras      Created: Sat Sep 20, 2012 11:40 AM      Regarding: Appt with VL 09/26/12       Needs repeat US on 1/24 and visit with me to follow--approx 6 weeks, needs f/u viability [place] and potassium level.            Please arrange and inform patient of time.            Thanks!      VL

## 2012-09-26 ENCOUNTER — Other Ambulatory Visit: Payer: BC Managed Care – PPO

## 2012-09-26 ENCOUNTER — Encounter: Payer: BC Managed Care – PPO | Admitting: Obstetrics and Gynecology

## 2012-09-26 ENCOUNTER — Inpatient Hospital Stay (HOSPITAL_COMMUNITY)
Admission: AD | Admit: 2012-09-26 | Discharge: 2012-09-27 | Disposition: A | Discharge status: Home / Self Care | Payer: BC Managed Care – PPO | Source: Ambulatory Visit | Attending: Obstetrics and Gynecology | Admitting: Obstetrics and Gynecology

## 2012-09-26 ENCOUNTER — Inpatient Hospital Stay (HOSPITAL_COMMUNITY): Payer: BC Managed Care – PPO

## 2012-09-26 DIAGNOSIS — O10019 Pre-existing essential hypertension complicating pregnancy, unspecified trimester: Secondary | ICD-10-CM | POA: Insufficient documentation

## 2012-09-26 DIAGNOSIS — O99891 Other specified diseases and conditions complicating pregnancy: Secondary | ICD-10-CM | POA: Insufficient documentation

## 2012-09-26 DIAGNOSIS — E876 Hypokalemia: Secondary | ICD-10-CM | POA: Insufficient documentation

## 2012-09-26 LAB — COMPREHENSIVE METABOLIC PANEL
AST: 13 U/L (ref 0–37)
CO2: 32 mEq/L (ref 19–32)
Calcium: 9.2 mg/dL (ref 8.4–10.5)
Creatinine, Ser: 0.75 mg/dL (ref 0.50–1.10)
GFR calc Af Amer: >90 mL/min (ref >90)
GFR calc non Af Amer: >90 mL/min (ref >90)
Glucose, Bld: 105 mg/dL — ABNORMAL HIGH (ref 70–99)

## 2012-09-26 LAB — CBC
MCH: 20.6 pg — ABNORMAL LOW (ref 26.0–34.0)
MCV: 66 fL — ABNORMAL LOW (ref 78.0–100.0)
Platelets: 306 10*3/uL (ref 150–400)
RBC: 4.71 MIL/uL (ref 3.87–5.11)

## 2012-09-26 MED ORDER — SODIUM CHLORIDE 0.9 % IV SOLN
Freq: Once | INTRAVENOUS | Status: AC
  Administered 2012-09-26: via INTRAVENOUS
  Filled 2012-09-26: qty 1000

## 2012-09-26 NOTE — MAU Note (Signed)
I was here last Friday and my potassium is low and my B/P is hight. Was put on potassium pills and I still have same symptoms I have with low potassium. Hyperaldosterone

## 2012-09-26 NOTE — MAU Provider Note (Signed)
History     CSN: [id number]  Arrival date and time: 09/26/12 2011   None     No chief complaint on file.  HPI Comments: Pt is a [id number] at [redacted]w[redacted]d by unknown LMP, [place] for dating on 1/17, showed IUP +YS, no embryo. She seems to have vague complaints. She states she always feels this way when her potassium gets low. She has hyperaldosteronism, she states has been taking K supplement and antihypertensives as prescribed. She states she has tingling and her hands and feet. She denies HA or visual disturbances. Pt had an appointment at [facility] today but did not show up, she states no one called her about the appt. She was seen in MAU on 1/13 for same sx's and was instructed to f/u w endocrinology which she has not done yet. She sees Dr Sharl Ma and states "it's been a while" since she's seen him.      Past Medical History  Diagnosis Date  . Hypokalemia 2007    IP Sept 2012  . Hypertension   . Primary aldosteronism   . Migraine headache   . Scoliosis   . Thalassemia minor   . Aldosteronism 2012  . H/O thalassemia   . Migraine   . H/O varicella   . H/O candidiasis   . Abnormal Pap smear 2010    Colpo & LEEP  . Hx: UTI (urinary tract infection) 2010  . Sickle cell trait   . Diabetes mellitus   . Yeast infection   . H/O bacterial infection   . Dermatitis 2007  . BV (bacterial vaginosis) 2008  . Monilial vaginitis 2008  . Yeast vaginitis 2010  . CIN I (cervical intraepithelial neoplasia I) 2010  . Postpartum hypertension 07/06/10    Past Surgical History  Procedure Date  . Breast reduction surgery Jan 2006  . Cesarean section 2005 2011  . Dilation and curettage of uterus 2003  . Wisdom tooth extraction     Family History  Problem Relation Age of Onset  . Thalassemia Mother   . Hypertension Father   . Heart attack Father   . Heart disease Father     MI  . Breast cancer Maternal Aunt   . Breast cancer Maternal Grandmother   . Cancer Maternal Grandfather     Bone    History    Substance Use Topics  . Smoking status: Never Smoker   . Smokeless tobacco: Never Used  . Alcohol Use: Yes    Allergies:  Allergies  Allergen Reactions  . Sulfa Antibiotics Other (See Comments)    Eyes turn blood shot red    Prescriptions prior to admission  Medication Sig Dispense Refill  . labetalol (NORMODYNE) 200 MG tablet Take 2 tablets (400 mg total) by mouth 2 (two) times daily.  60 tablet  2  . methyldopa (ALDOMET) 250 MG tablet Take 1 tablet (250 mg total) by mouth 3 (three) times daily.  90 tablet  2  . potassium chloride SA (K-DUR,KLOR-CON) 20 MEQ tablet Take 2 tablets (40 mEq total) by mouth 2 (two) times daily. After 1 week, take 2 tabs po daily.  35 tablet  2  . Prenatal Vit-Fe Fumarate-FA (PRENATAL VITAMINS PLUS) 27-1 MG TABS Take 1 tablet by mouth daily. 1 tab po once daily  30 tablet  0  . metroNIDAZOLE (FLAGYL) 500 MG tablet Take 1 tablet (500 mg total) by mouth 2 (two) times daily. X 7 days  14 tablet  0    Review of Systems  HENT: Positive for congestion. Negative for sore throat.   Eyes: Negative for blurred vision.  Respiratory: Negative for cough.   Cardiovascular: Negative for chest pain.  Gastrointestinal: Positive for nausea and abdominal pain. Negative for vomiting.  Genitourinary: Negative for dysuria.       Denies vaginal bleeding or discharge  Musculoskeletal: Negative for myalgias and falls.  Neurological: Positive for tingling. Negative for weakness and headaches.  All other systems reviewed and are negative.   Physical Exam   Blood pressure 151/72, pulse 82, temperature 98.2 F (36.8 C), temperature source Oral, resp. rate 20, height 5\' 7"  (1.702 m), weight 201 lb 12.8 oz (91.536 kg), last menstrual period 08/17/2012.   Filed Vitals:   09/26/12 2043 09/26/12 2241  BP: 174/90 151/72  Pulse: 81 82  Temp: 98.2 F (36.8 C)   TempSrc: Oral   Resp: 20   Height: 5\' 7"  (1.702 m)   Weight: 201 lb 12.8 oz (91.536 kg)      Physical Exam   Nursing note and vitals reviewed. Constitutional: She is oriented to person, place, and time. She appears well-developed and well-nourished.       Pt has flat affect but otherwise appropriate   HENT:  Head: Normocephalic.  Eyes: Pupils are equal, round, and reactive to light.  Neck: Normal range of motion.  Cardiovascular: Normal rate, regular rhythm and normal heart sounds.   Respiratory: Effort normal and breath sounds normal.  GI: Soft. Bowel sounds are normal. She exhibits no distension. There is no tenderness.  Genitourinary:       Deferred   Musculoskeletal: Normal range of motion.       Normal bilateral grip and muscle strength   Neurological: She is alert and oriented to person, place, and time. She has normal reflexes.  Skin: Skin is warm and dry.  Psychiatric: She has a normal mood and affect. Her behavior is normal.    Results for orders placed during the hospital encounter of 09/26/12 (from the past 48 hour(s))  CBC     Status: Abnormal   Collection Time   09/26/12 10:00 PM      Component Value Range Comment   WBC 8.1  4.0 - 10.5 K/uL    RBC 4.71  3.87 - 5.11 MIL/uL    Hemoglobin 9.7 (*) 12.0 - 15.0 g/dL    HCT 82.9 (*) 56.2 - 46.0 %    MCV 66.0 (*) 78.0 - 100.0 fL    MCH 20.6 (*) 26.0 - 34.0 pg    MCHC 31.2  30.0 - 36.0 g/dL    RDW 13.0 (*) 86.5 - 15.5 %    Platelets 306  150 - 400 K/uL   COMPREHENSIVE METABOLIC PANEL     Status: Abnormal   Collection Time   09/26/12 10:00 PM      Component Value Range Comment   Sodium 135  135 - 145 mEq/L    Potassium 2.5 (*) 3.5 - 5.1 mEq/L    Chloride 94 (*) 96 - 112 mEq/L    CO2 32  19 - 32 mEq/L    Glucose, Bld 105 (*) 70 - 99 mg/dL    BUN 8  6 - 23 mg/dL    Creatinine, Ser 7.84  0.50 - 1.10 mg/dL    Calcium 9.2  8.4 - 69.6 mg/dL    Total Protein 6.6  6.0 - 8.3 g/dL    Albumin 3.0 (*) 3.5 - 5.2 g/dL    AST 13  0 - 37  U/L    ALT 10  0 - 35 U/L    Alkaline Phosphatase 69  39 - 117 U/L    Total Bilirubin 0.2 (*) 0.3 -  1.2 mg/dL    GFR calc non Af Amer >90  >90 mL/min    GFR calc Af Amer >90  >90 mL/min      MAU Course  Procedures   Assessment and Plan  IUP at [redacted]w[redacted]d Hypokalemia Hypertension  Will give IVF's w K+ Repeat US for viability  D/W Dr Estanislado Pandy Pt to see Dr Sharl Ma ASAP F/u in office for NOB interview and w/u     Emery Dupuy M 09/26/2012, 11:17 PM

## 2012-09-29 ENCOUNTER — Telehealth: Payer: Self-pay | Admitting: Obstetrics and Gynecology

## 2012-09-29 NOTE — Telephone Encounter (Signed)
Pt called complaining of symptoms of low potassium which she is familiar with because of her hyperaldosteronism.  She was seen in [facility] MAU on 09/19/12 and was instructed to take potassium chloride 2 tablets twice daily for a week the 1 tablet twice daily thereafter.  She was unsure of the dose of each of her tablets, but thought they were 10 mEq tablets.  She was instructed to continue 2 tablets twice daily, and to keep her scheduled appt on 10/10/12. On review of her chart, she had been prescribed K-Dur 20 mEq tablets, and will be instructed to take one tablet 4 times daily until her appt on 10/10/12.  She was also instructed to make an appt with her endocrinologist, Dr.Kerr.  She is awaiting a return call from his office for an appt.

## 2012-09-30 NOTE — Telephone Encounter (Signed)
Tc to pt to reinerate [facility] recs to take potassium 20 mEq 4 times daily with food. Pt voices understanding.

## 2012-10-10 ENCOUNTER — Telehealth: Payer: Self-pay | Admitting: Obstetrics and Gynecology

## 2012-10-10 ENCOUNTER — Encounter: Payer: BC Managed Care – PPO | Admitting: Obstetrics and Gynecology

## 2012-10-10 NOTE — Telephone Encounter (Signed)
Tc to pt regarding msg.  C/w w/ JEO, advised pt to start with eating small frequent meals, ginger tea/ale, Vitamin B6, etc first.  If no relief over the weekend advised to call after hours and speak w/ a provider if can not wait until Monday.  Pt voices agreement.

## 2012-10-16 ENCOUNTER — Encounter: Payer: BC Managed Care – PPO | Admitting: Obstetrics and Gynecology

## 2012-10-23 ENCOUNTER — Encounter: Payer: Self-pay | Admitting: Certified Nurse Midwife

## 2012-10-23 ENCOUNTER — Ambulatory Visit: Payer: BC Managed Care – PPO | Admitting: Obstetrics and Gynecology

## 2012-10-23 ENCOUNTER — Other Ambulatory Visit: Payer: Self-pay | Admitting: Obstetrics and Gynecology

## 2012-10-23 ENCOUNTER — Encounter: Payer: Self-pay | Admitting: Obstetrics and Gynecology

## 2012-10-23 DIAGNOSIS — Z331 Pregnant state, incidental: Secondary | ICD-10-CM

## 2012-10-23 LAB — COMPREHENSIVE METABOLIC PANEL
AST: 13 U/L (ref 0–37)
Albumin: 3.3 g/dL — ABNORMAL LOW (ref 3.5–5.2)
BUN: 7 mg/dL (ref 6–23)
Calcium: 9.4 mg/dL (ref 8.4–10.5)
Chloride: 97 mEq/L (ref 96–112)
Creat: 0.59 mg/dL (ref 0.50–1.10)
Glucose, Bld: 103 mg/dL — ABNORMAL HIGH (ref 70–99)
Potassium: 2.8 mEq/L — ABNORMAL LOW (ref 3.5–5.3)

## 2012-10-23 LAB — OB RESULTS CONSOLE GC/CHLAMYDIA: Chlamydia: NEGATIVE

## 2012-10-23 LAB — POCT URINALYSIS DIPSTICK
Glucose, UA: NEGATIVE
Nitrite, UA: NEGATIVE
Urobilinogen, UA: NEGATIVE

## 2012-10-23 LAB — CBC WITH DIFFERENTIAL/PLATELET
Basophils Absolute: 0 10*3/uL (ref 0.0–0.1)
Eosinophils Relative: 1 % (ref 0–5)
HCT: 32.2 % — ABNORMAL LOW (ref 36.0–46.0)
Hemoglobin: 10.4 g/dL — ABNORMAL LOW (ref 12.0–15.0)
Lymphocytes Relative: 15 % (ref 12–46)
MCHC: 32.3 g/dL (ref 30.0–36.0)
MCV: 64.9 fL — ABNORMAL LOW (ref 78.0–100.0)
Monocytes Absolute: 0.4 10*3/uL (ref 0.1–1.0)
Monocytes Relative: 4 % (ref 3–12)
Neutro Abs: 8.3 10*3/uL — ABNORMAL HIGH (ref 1.7–7.7)
RDW: 16.4 % — ABNORMAL HIGH (ref 11.5–15.5)
WBC: 10.3 10*3/uL (ref 4.0–10.5)

## 2012-10-23 MED ORDER — POTASSIUM CHLORIDE CRYS ER 20 MEQ PO TBCR: 40.0000 meq | EXTENDED_RELEASE_TABLET | Freq: Two times a day (BID) | ORAL | Status: DC

## 2012-10-23 MED ORDER — LABETALOL HCL 200 MG PO TABS: 200.0000 mg | ORAL_TABLET | Freq: Two times a day (BID) | ORAL | Status: DC

## 2012-10-23 MED ORDER — ONDANSETRON 8 MG PO TBDP: 8.0000 mg | ORAL_TABLET | Freq: Three times a day (TID) | ORAL | Status: DC | PRN

## 2012-10-23 NOTE — Progress Notes (Unsigned)
Additional note: Pt advised to F/U with DR Sharl Ma ASAP.

## 2012-10-23 NOTE — Progress Notes (Signed)
NOB interview. Retaining small amount of food and fluid but vomiting almost every time she eats or drinks x 2 days. States D/C'd K+ about 1-1 1/2 weeks ago. No tingling in hands or feet.  Took Aldomet and Labetalol at 7AM. Consult with DR VPH. Stat CBC with diff and CMP ordered. Rx for increase in Labetalol, Zofran, and K-dur ordered. Pt advised if unable to retain food or fluids after 24 hours on Zofran or with any concerns to call office. Pt verbalizes comprehension of instructions and RX.Pt scheduled for NOB W/U with JO 10/24/12.

## 2012-10-24 ENCOUNTER — Inpatient Hospital Stay (HOSPITAL_COMMUNITY)
Admission: AD | Admit: 2012-10-24 | Discharge: 2012-10-24 | Disposition: A | Discharge status: Home / Self Care | Payer: BC Managed Care – PPO | Source: Ambulatory Visit | Attending: Obstetrics and Gynecology | Admitting: Obstetrics and Gynecology

## 2012-10-24 ENCOUNTER — Telehealth: Payer: Self-pay | Admitting: Obstetrics and Gynecology

## 2012-10-24 ENCOUNTER — Encounter (HOSPITAL_COMMUNITY): Payer: Self-pay

## 2012-10-24 ENCOUNTER — Ambulatory Visit: Payer: BC Managed Care – PPO | Admitting: Certified Nurse Midwife

## 2012-10-24 ENCOUNTER — Other Ambulatory Visit: Payer: Self-pay | Admitting: Obstetrics and Gynecology

## 2012-10-24 VITALS — BP 160/90 | Wt 201.0 lb

## 2012-10-24 DIAGNOSIS — O210 Mild hyperemesis gravidarum: Secondary | ICD-10-CM | POA: Insufficient documentation

## 2012-10-24 DIAGNOSIS — O219 Vomiting of pregnancy, unspecified: Secondary | ICD-10-CM

## 2012-10-24 DIAGNOSIS — D573 Sickle-cell trait: Secondary | ICD-10-CM

## 2012-10-24 DIAGNOSIS — O34219 Maternal care for unspecified type scar from previous cesarean delivery: Secondary | ICD-10-CM | POA: Insufficient documentation

## 2012-10-24 DIAGNOSIS — E269 Hyperaldosteronism, unspecified: Secondary | ICD-10-CM | POA: Insufficient documentation

## 2012-10-24 DIAGNOSIS — O039 Complete or unspecified spontaneous abortion without complication: Secondary | ICD-10-CM

## 2012-10-24 DIAGNOSIS — Z98891 History of uterine scar from previous surgery: Secondary | ICD-10-CM

## 2012-10-24 DIAGNOSIS — Z331 Pregnant state, incidental: Secondary | ICD-10-CM

## 2012-10-24 DIAGNOSIS — E876 Hypokalemia: Secondary | ICD-10-CM | POA: Insufficient documentation

## 2012-10-24 DIAGNOSIS — Z9889 Other specified postprocedural states: Secondary | ICD-10-CM

## 2012-10-24 DIAGNOSIS — O10019 Pre-existing essential hypertension complicating pregnancy, unspecified trimester: Secondary | ICD-10-CM | POA: Insufficient documentation

## 2012-10-24 LAB — PRENATAL PANEL VII
Basophils Relative: 0 % (ref 0–1)
HCT: 32.1 % — ABNORMAL LOW (ref 36.0–46.0)
HIV: NONREACTIVE
Hemoglobin: 10.2 g/dL — ABNORMAL LOW (ref 12.0–15.0)
Hepatitis B Surface Ag: NEGATIVE
Lymphocytes Relative: 16 % (ref 12–46)
Lymphs Abs: 1.7 10*3/uL (ref 0.7–4.0)
MCHC: 31.8 g/dL (ref 30.0–36.0)
Monocytes Absolute: 0.4 10*3/uL (ref 0.1–1.0)
Monocytes Relative: 4 % (ref 3–12)
Neutro Abs: 8.4 10*3/uL — ABNORMAL HIGH (ref 1.7–7.7)
Neutrophils Relative %: 79 % — ABNORMAL HIGH (ref 43–77)
RBC: 4.98 MIL/uL (ref 3.87–5.11)
Rh Type: POSITIVE
WBC: 10.5 10*3/uL (ref 4.0–10.5)

## 2012-10-24 LAB — URINALYSIS, ROUTINE W REFLEX MICROSCOPIC
Glucose, UA: NEGATIVE mg/dL
Leukocytes, UA: NEGATIVE
Protein, ur: NEGATIVE mg/dL
Specific Gravity, Urine: 1.01 (ref 1.005–1.030)

## 2012-10-24 MED ORDER — PROMETHAZINE HCL 25 MG RE SUPP: 25.0000 mg | Freq: Four times a day (QID) | RECTAL | Status: DC | PRN

## 2012-10-24 MED ORDER — KCL IN DEXTROSE-NACL 20-5-0.9 MEQ/L-%-% IV SOLN: INTRAVENOUS | Status: DC

## 2012-10-24 MED ORDER — SODIUM CHLORIDE 0.9 % IV BOLUS (SEPSIS)
1000.0000 mL | Freq: Once | INTRAVENOUS | Status: AC
  Administered 2012-10-24: 1000 mL via INTRAVENOUS

## 2012-10-24 MED ORDER — ONDANSETRON 8 MG/NS 50 ML IVPB
8.0000 mg | Freq: Four times a day (QID) | INTRAVENOUS | Status: DC | PRN
Start: 2012-10-24 — End: 2012-10-25
  Administered 2012-10-24: 8 mg via INTRAVENOUS
  Filled 2012-10-24: qty 8

## 2012-10-24 MED ORDER — PROMETHAZINE HCL 25 MG PO TABS: 25.0000 mg | ORAL_TABLET | Freq: Four times a day (QID) | ORAL | Status: DC | PRN

## 2012-10-24 MED ORDER — POTASSIUM CHLORIDE 2 MEQ/ML IV SOLN
INTRAVENOUS | Status: DC
  Administered 2012-10-24: 20:00:00 via INTRAVENOUS
  Filled 2012-10-24 (×6): qty 1000

## 2012-10-24 NOTE — MAU Provider Note (Signed)
History   Krista [name] is a 31y.o. BF at [redacted]w[redacted]d who presents from office for IV hydration w/ CC of not being able to "keep anything down" for about the last 24 hrs.  Seen for NOB w/u. Today.  Had interview yesterday at office and labwork and 2+ ketones on voided specimen.  Last seen in MAU 09/19/12 for elevated BP at [facility] and tingling around lips and thought K down.  She was seen earlier that week in Jan at [facility].  She has been Rx'd K, but hasn't been compliant w/ n/v during early part of pregnancy.  Her BP medicine was changed yesterday, as another agent, Labetalol 200mg  po bid, added to her Aldomet 250mg  po tid.  She has been followed in the past for hyperaldosteronism by Dr. Sharl Ma, but his office was contacted today by [facility] RN's and they will not see her b/c of an outstanding balance.  Pt is a "bus driver."  She last ate [place] fries from [place] around 1600.  She has had an episode of emesis since in MAU.  Last BM yesterday and normal.   Pt denies fever, chills, VB, abnl d/c, dysuria, or resp c/o's.    Patient Active Problem List  Diagnosis  . Hyperaldosteronism  . Hypokalemia  . HTN (hypertension)  . Leukocytosis  . H/O cesarean section x 2  . SAB (spontaneous abortion) x 3, TAB x 2  . H/O LEEP (loop electrosurgical excision procedure) of cervix complicating pregnancy  . H/O bilateral breast reduction surgery  . Sickle cell trait  . Thalassemia minor  . Nausea and vomiting in pregnancy    CSN: [id number]  Arrival date and time: 10/24/12 1639   None     Chief Complaint  Patient presents with  . Emesis During Pregnancy   HPI  OB History   Grav Para Term Preterm Abortions TAB SAB Ect Mult Living   7 2 2  0 4 1 3  1 3       Past Medical History  Diagnosis Date  . Hypokalemia 2007    IP Sept 2012  . Hypertension   . Primary aldosteronism   . Scoliosis   . Aldosteronism 2012  . H/O thalassemia   . H/O varicella   . H/O candidiasis   . Hx: UTI (urinary tract infection)  2010  . Sickle cell trait   . Yeast infection   . H/O bacterial infection   . Dermatitis 2007  . BV (bacterial vaginosis) 2008  . Monilial vaginitis 2008  . Yeast vaginitis 2010  . CIN I (cervical intraepithelial neoplasia I) 2010  . Postpartum hypertension 07/06/10  . Pregnancy induced hypertension 2011  . Abnormal Pap smear 2010    Colpo & LEEP; LAST PAP 09/2011  . Infection     UTI X 1  . Thalassemia minor     CHRONIC  . Migraine headache     OTC  . Migraine     Past Surgical History  Procedure Laterality Date  . Breast reduction surgery  Jan 2006  . Cesarean section  2005 2011  . Dilation and curettage of uterus  2003  . Wisdom tooth extraction      Family History  Problem Relation Age of Onset  . Thalassemia Mother   . Arthritis Mother   . Early death Mother 66  . Other Mother     GUILLON-BERRE SX; BLOOD CLOT  . Hypertension Father   . Heart attack Father   . Heart disease Father  MI  . Early death Father   . Kidney disease Father   . Breast cancer Maternal Aunt   . Breast cancer Maternal Grandmother   . Cancer Maternal Grandfather     Bone  . Early death Sister 40    History  Substance Use Topics  . Smoking status: Never Smoker   . Smokeless tobacco: Never Used  . Alcohol Use: Yes     Comment: OCC    Allergies:  Allergies  Allergen Reactions  . Sulfa Antibiotics Other (See Comments)    Eyes turn blood shot red    Prescriptions prior to admission  Medication Sig Dispense Refill  . labetalol (NORMODYNE) 200 MG tablet Take 1 tablet (200 mg total) by mouth 2 (two) times daily.  180 tablet  0  . methyldopa (ALDOMET) 250 MG tablet Take 1 tablet (250 mg total) by mouth 3 (three) times daily.  90 tablet  2  . ondansetron (ZOFRAN-ODT) 8 MG disintegrating tablet Take 1 tablet (8 mg total) by mouth every 8 (eight) hours as needed for nausea.  20 tablet  2  . potassium chloride SA (K-DUR,KLOR-CON) 20 MEQ tablet Take 2 tablets (40 mEq total) by mouth 2  (two) times daily. TAKE TABLETS (40 MEQ) TWO TIMES DAILY  120 tablet  0  . Prenatal Vit-Fe Fumarate-FA (PRENATAL MULTIVITAMIN) TABS Take 1 tablet by mouth daily.        ROS--see HPI above Physical Exam   Blood pressure 159/84, pulse 91, temperature 97.2 F (36.2 C), temperature source Oral, resp. rate 18, height 5\' 7"  (1.702 m), weight 204 lb 6.4 oz (92.715 kg), last menstrual period 08/17/2012, SpO2 100.00%. ..  Results for orders placed during the hospital encounter of 10/24/12 (from the past 72 hour(s))  URINALYSIS, ROUTINE W REFLEX MICROSCOPIC     Status: None   Collection Time    10/24/12  4:45 PM      Result Value Range   Color, Urine YELLOW  YELLOW   APPearance CLEAR  CLEAR   Specific Gravity, Urine 1.010  1.005 - 1.030   pH 6.0  5.0 - 8.0   Glucose, UA NEGATIVE  NEGATIVE mg/dL   Hgb urine dipstick NEGATIVE  NEGATIVE   Bilirubin Urine NEGATIVE  NEGATIVE   Ketones, ur NEGATIVE  NEGATIVE mg/dL   Protein, ur NEGATIVE  NEGATIVE mg/dL   Urobilinogen, UA 1.0  0.0 - 1.0 mg/dL   Nitrite NEGATIVE  NEGATIVE   Leukocytes, UA NEGATIVE  NEGATIVE   Comment: MICROSCOPIC NOT DONE ON URINES WITH NEGATIVE PROTEIN, BLOOD, LEUKOCYTES, NITRITE, OR GLUCOSE <1000 mg/dL.   Physical Exam  Constitutional: She is oriented to person, place, and time. She appears well-developed and well-nourished. No distress.  Mucous membranes moist  HENT:  Head: Normocephalic and atraumatic.  Eyes: Pupils are equal, round, and reactive to light.  Cardiovascular: Normal rate and regular rhythm.   Respiratory: Effort normal.  GI: Soft.  Genitourinary:  Pelvic exam deferred in MAU  Neurological: She is alert and oriented to person, place, and time.  Skin: Skin is warm and dry.  Psychiatric: She has a normal mood and affect. Her behavior is normal. Judgment and thought content normal.    MAU Course  Procedures 1. 1 Liter D5NS w/ of KCl 2.  Received about NS bolus 3. Zofran 8mg  IVPB  Assessment  and Plan  1. [redacted]w[redacted]d 2. N/v of pregnancy 3. Hypokalemia (=2.8 yesterday at office) 4. Hyperaldosteronism 5. CHTN 6. Previous c/s x2 7. Hgb=10.4 yesterday  1. Per c/w dr. Estanislado Pandy, pt d/c'd after IVF infused, to continue antihypertensives and KDur.  Rx'd pt po and pr phenergan to use prn, in addition to Zofran already Rx'd.  2. Diet rev'd at length and given copy of K-rich and hyperemesis diet recommendations 3. Will have office schedule appt next week w/ pt to check status and BP 4. Pt to f/u prn  Lassie Demorest H 10/24/2012, 8:34 PM

## 2012-10-24 NOTE — Telephone Encounter (Signed)
Pt left VM 10/23/12 that Labetalol needed prior approval. TC to [facility] at [address]. Per pharmacist has been approved and is available. TC to pt. LM with this information.

## 2012-10-24 NOTE — MAU Note (Signed)
Patient states she is unable to keep anything down.

## 2012-10-24 NOTE — MAU Note (Signed)
Pt states here from MD office for iv fluids. Vomited prior to RN coming into room. Took zofran 8mg  at 11am. Denies abnormal vag d/c changes or bleeding. Ate 45 minutes ago.

## 2012-10-24 NOTE — Progress Notes (Signed)
Krista [name] is being seen today for her first obstetrical visit at [redacted]w[redacted]d gestation by LMP.  She reports feeling very dehydrated and feels like her potasium is low.  She has had n/v the entire pregnancy, vomiting 2-3x/day, having "trouble keeping anything down."  Today she reports that the last thing she was able to keep down was a grapefruit yesterday am at 9:00, since she has tried to eat but was not able to keep it down.  Yesterday she was rx'd Zofran Q8hr.  She reports taking the Zofran Q8hrs and got 3 dose in before her visit today. Reports it did not work. C/w Dr. Estanislado [name], she is concerned that the n/v is related to her hyperaldosteronism and she needs to go back to see Dr. Sharl [name] (Endocrinology).  [facility] called Dr. Daune [name] office however billing has blocked her so we were unable to make the appt -- pt told and got right on the phone to resolve the issue.  BP (today): 160/90 Currently on Aldomet 250mg  TID (started 09/19/12); Labetalol 200 mg BID (stated yesterday)      K+: 2.5 (09/26/12);  2.8 (10/23/12)     Rx'd K-dur 10/23/12.  Pt states that she has not been able to take K+ for at least 2 weeks d/t N/V  Her obstetrical history is significant for: Patient Active Problem List  Diagnosis  . Hyperaldosteronism  . Hypokalemia  . HTN (hypertension)  . Leukocytosis  . H/O cesarean section x 2  . SAB (spontaneous abortion) x 3, TAB x 2  . H/O LEEP (loop electrosurgical excision procedure) of cervix complicating pregnancy  . H/O bilateral breast reduction surgery  . Sickle cell trait  . Thalassemia minor    Relationship with FOB:  Unknown; divorced  She is not employed  Feeding plan:   Breastfeeding  Pregnancy history fully reviewed.  The following portions of the patient's history were reviewed and updated as appropriate: allergies, current medications, past family history, past medical history, past social history, past surgical history and problem list.  Review of Systems Pertinent  ROS is described in HPI   Objective:   BP 160/90  Wt 201 lb (91.173 kg)  BMI 31.47 kg/m2  LMP 08/17/2012 Wt Readings from Last 1 Encounters:  10/24/12 201 lb (91.173 kg)   BMI: Body mass index is 31.47 kg/(m^2).   General: alert, cooperative but very fatigued HEENT: grossly normal  Thyroid: normal  Respiratory: clear to auscultation bilaterally Cardiovascular: regular rate and rhythm,  Breasts:  No dominant masses, nipples erect Gastrointestinal: soft, non-tender; no masses,  no organomegaly Extremities: extremities normal, no pain or edema Vaginal Bleeding: None  EXTERNAL GENITALIA: normal appearing vulva with no masses, tenderness or lesions VAGINA: no abnormal discharge or lesions CERVIX: no lesions or cervical motion tenderness; cervix closed, long, firm UTERUS: gravid and consistent with 9 weeks ADNEXA: no masses palpable and nontender OB EXAM PELVIMETRY: appears adequate  FHR:  130  bpm  Assessment:   Pregnancy at  [redacted]w[redacted]d N/V in pregnancy Hyperaldosteronism - CHTN and hypokalemia Leukocytosis H/o C/S x2 H/o LEEP Sickle cell trait Thalassemia minor  Plan:   Prenatal panel reviewed and discussed with the patient:  yes  Pap smear collected:  yes GC/Chlamydia collected:  no Wet prep:  neg  Discussion of Genetic testing options: 1st T screening in 1-2 weeks  Prenatal vitamins recommended  Next visit:  4 weeks for ROB  Other anticipated f/u:   Appt with Dr. Sharl [name] ASAP;    RTC for 1st  T genetic screening in 1-2 weeks   Krista [name], CNM, MSN

## 2012-10-24 NOTE — Progress Notes (Signed)
Pt is here today for her NOB work-up. Pt stated she last ate was last night about 7 pm. Pt stated she is due for a pap.

## 2012-10-25 LAB — CULTURE, OB URINE: Colony Count: 25000

## 2012-10-27 LAB — PAP IG W/ RFLX HPV ASCU

## 2012-11-03 ENCOUNTER — Other Ambulatory Visit: Payer: Self-pay

## 2012-11-03 ENCOUNTER — Other Ambulatory Visit: Payer: Self-pay | Admitting: Certified Nurse Midwife

## 2012-11-03 ENCOUNTER — Other Ambulatory Visit: Payer: BC Managed Care – PPO

## 2012-11-03 DIAGNOSIS — Z36 Encounter for antenatal screening of mother: Secondary | ICD-10-CM

## 2012-11-10 ENCOUNTER — Ambulatory Visit: Payer: BC Managed Care – PPO

## 2012-11-10 DIAGNOSIS — Z36 Encounter for antenatal screening of mother: Secondary | ICD-10-CM

## 2012-11-10 LAB — US OB COMP LESS 14 WKS

## 2012-11-21 ENCOUNTER — Encounter: Payer: Self-pay | Admitting: Obstetrics and Gynecology

## 2012-11-21 ENCOUNTER — Ambulatory Visit: Payer: BC Managed Care – PPO | Admitting: Obstetrics and Gynecology

## 2012-11-21 VITALS — BP 120/82 | Wt 202.0 lb

## 2012-11-21 DIAGNOSIS — Z3490 Encounter for supervision of normal pregnancy, unspecified, unspecified trimester: Secondary | ICD-10-CM

## 2012-11-21 NOTE — Progress Notes (Signed)
[redacted]w[redacted]d Appt with Dr. Sharl Ma is March 31st.  Pt c/o N & V no relief with Zofran and Phenergan

## 2012-11-21 NOTE — Progress Notes (Signed)
Still struggling with N/V--on Zofran and Phenergan. CM9 for ketones today--declines need for IV fluids at present. Has appt with Dr. Sharl Ma on 3/31--will see patient her in 2 weeks to check weight and status of hydration, N/V. Remains on Aldomet and Labetalol, with patient normotensive.

## 2013-02-10 ENCOUNTER — Inpatient Hospital Stay (HOSPITAL_COMMUNITY)
Admission: AD | Admit: 2013-02-10 | Discharge: 2013-02-10 | Disposition: A | Discharge status: Home / Self Care | Payer: BC Managed Care – PPO | Source: Ambulatory Visit | Attending: Obstetrics and Gynecology | Admitting: Obstetrics and Gynecology

## 2013-02-10 ENCOUNTER — Encounter (HOSPITAL_COMMUNITY): Payer: Self-pay | Admitting: *Deleted

## 2013-02-10 DIAGNOSIS — Z98891 History of uterine scar from previous surgery: Secondary | ICD-10-CM

## 2013-02-10 DIAGNOSIS — O039 Complete or unspecified spontaneous abortion without complication: Secondary | ICD-10-CM

## 2013-02-10 DIAGNOSIS — D573 Sickle-cell trait: Secondary | ICD-10-CM

## 2013-02-10 DIAGNOSIS — O219 Vomiting of pregnancy, unspecified: Secondary | ICD-10-CM

## 2013-02-10 DIAGNOSIS — Z9889 Other specified postprocedural states: Secondary | ICD-10-CM

## 2013-02-10 DIAGNOSIS — O4700 False labor before 37 completed weeks of gestation, unspecified trimester: Secondary | ICD-10-CM | POA: Insufficient documentation

## 2013-02-10 DIAGNOSIS — R109 Unspecified abdominal pain: Secondary | ICD-10-CM | POA: Insufficient documentation

## 2013-02-10 LAB — URINALYSIS, ROUTINE W REFLEX MICROSCOPIC
Bilirubin Urine: NEGATIVE
Hgb urine dipstick: NEGATIVE
Protein, ur: NEGATIVE mg/dL
Urobilinogen, UA: 1 mg/dL (ref 0.0–1.0)

## 2013-02-10 LAB — WET PREP, GENITAL

## 2013-02-10 LAB — FETAL FIBRONECTIN: Fetal Fibronectin: NEGATIVE

## 2013-02-10 NOTE — MAU Note (Signed)
Pt had cramping and sharp pains that  Started at 1400.

## 2013-02-10 NOTE — MAU Note (Signed)
Patient states she has started having lower abdominal cramping with some sharp pain this afternoon. Denies bleeding or leaking and reports good fetal movement.

## 2013-02-11 LAB — GC/CHLAMYDIA PROBE AMP: CT Probe RNA: NEGATIVE

## 2013-03-14 NOTE — MAU Provider Note (Signed)
History    CSN: [id number]  Arrival date and time: 02/10/13 1551   First Provider Initiated Contact with Patient 02/10/13 1630      Chief Complaint  Patient presents with  . Abdominal Pain   HPI Pt presents unannounced to MAU at 25w 2d with c/o of sudden sharp lower abdominal pain this afternoon.  Denies menstrual type cramping.  Denies ROM or vag bleeding.  Reports active fetus.  Denies nausea/vomiting.  States pain began with movement today and has improved/resolved since presentation.  Denies any UTI s/s.     OB History   Grav Para Term Preterm Abortions TAB SAB Ect Mult Living   7 2 2  0 4 1 3  1 3       Past Medical History  Diagnosis Date  . Hypokalemia 2007    IP Sept 2012  . Hypertension   . Primary aldosteronism   . Scoliosis   . Aldosteronism 2012  . H/O thalassemia   . H/O varicella   . H/O candidiasis   . Hx: UTI (urinary tract infection) 2010  . Sickle cell trait   . Yeast infection   . H/O bacterial infection   . Dermatitis 2007  . BV (bacterial vaginosis) 2008  . Monilial vaginitis 2008  . Yeast vaginitis 2010  . CIN I (cervical intraepithelial neoplasia I) 2010  . Postpartum hypertension 07/06/10  . Pregnancy induced hypertension 2011  . Abnormal Pap smear 2010    Colpo & LEEP; LAST PAP 09/2011  . Infection     UTI X 1  . Thalassemia minor     CHRONIC  . Migraine headache     OTC  . Migraine     Past Surgical History  Procedure Laterality Date  . Breast reduction surgery  Jan 2006  . Cesarean section  2005 2011  . Dilation and curettage of uterus  2003  . Wisdom tooth extraction      Family History  Problem Relation Age of Onset  . Thalassemia Mother   . Arthritis Mother   . Early death Mother 69  . Other Mother     GUILLON-BERRE SX; BLOOD CLOT  . Hypertension Father   . Heart attack Father   . Heart disease Father     MI  . Early death Father   . Kidney disease Father   . Breast cancer Maternal Aunt   . Breast cancer Maternal  Grandmother   . Cancer Maternal Grandfather     Bone    History  Substance Use Topics  . Smoking status: Never Smoker   . Smokeless tobacco: Never Used  . Alcohol Use: Yes     Comment: OCC    Allergies:  Allergies  Allergen Reactions  . Sulfa Antibiotics Other (See Comments)    Eyes turn blood shot red    No prescriptions prior to admission    Review of Systems  Constitutional: Negative.   HENT: Negative.   Eyes: Negative.   Respiratory: Negative.   Cardiovascular: Negative.   Gastrointestinal: Negative.   Genitourinary: Negative.   Musculoskeletal: Negative.   Skin: Negative.   Neurological: Negative.   Endo/Heme/Allergies: Negative.   Psychiatric/Behavioral: Negative.    Physical Exam   Blood pressure 153/84, pulse 93, temperature 98.6 F (37 C), temperature source Oral, resp. rate 18, height 5\' 7"  (1.702 m), weight 98.34 kg (216 lb 12.8 oz), last menstrual period 08/17/2012, SpO2 100.00%.  Physical Exam  Constitutional: She is oriented to person, place, and time. She appears  well-developed and well-nourished.  HENT:  Head: Normocephalic and atraumatic.  Right Ear: External ear normal.  Left Ear: External ear normal.  Nose: Nose normal.  Eyes: Conjunctivae are normal. Pupils are equal, round, and reactive to light.  Neck: Normal range of motion. Neck supple.  Cardiovascular: Normal rate, regular rhythm and intact distal pulses.   Respiratory: Effort normal and breath sounds normal.  GI: Soft. Bowel sounds are normal. There is no tenderness. There is no rebound and no guarding.  Genitourinary: Vagina normal and uterus normal.  Ext gent WNL.  BUS neg.  Vagina with sm amt white d/c in vault.  Cx without lesions or d/c.  Neg CMT.  Ut mobile, soft, NT, gravid.  SVE: Closed/Thick/No palpable presenting part.  Musculoskeletal: Normal range of motion.  Neurological: She is alert and oriented to person, place, and time. She has normal reflexes.  Skin: Skin is warm  and dry.  Psychiatric: She has a normal mood and affect. Her behavior is normal.   FHR baseline 135 bpm; variability-mod; accels-present; decels-absent.  FHR Cat 1. Toco: No UCs noted.     MAU Course  Procedures UA-neg Wet prep-neg FFN-neg GC/Chl-pending  Assessment and Plan  IUP at 25wk 2d Abdominal pain-resolved-C/W round ligament pain  Consult obtained with Dr. Su Hilt Discharged to home. Revd s/s preterm labor and fetal kick counts F/U as sched at [facility] in 13 days.      Sameer Teeple O. 03/14/2013, 5:58 PM

## 2013-03-27 ENCOUNTER — Inpatient Hospital Stay (HOSPITAL_COMMUNITY)
Admission: AD | Admit: 2013-03-27 | Discharge: 2013-03-27 | Disposition: A | Discharge status: Home / Self Care | Payer: BC Managed Care – PPO | Source: Ambulatory Visit | Attending: Obstetrics and Gynecology | Admitting: Obstetrics and Gynecology

## 2013-03-27 ENCOUNTER — Encounter (HOSPITAL_COMMUNITY): Payer: Self-pay | Admitting: *Deleted

## 2013-03-27 DIAGNOSIS — O4700 False labor before 37 completed weeks of gestation, unspecified trimester: Secondary | ICD-10-CM | POA: Insufficient documentation

## 2013-03-27 DIAGNOSIS — R109 Unspecified abdominal pain: Secondary | ICD-10-CM | POA: Insufficient documentation

## 2013-03-27 DIAGNOSIS — O99019 Anemia complicating pregnancy, unspecified trimester: Secondary | ICD-10-CM | POA: Insufficient documentation

## 2013-03-27 DIAGNOSIS — E876 Hypokalemia: Secondary | ICD-10-CM | POA: Insufficient documentation

## 2013-03-27 DIAGNOSIS — D72829 Elevated white blood cell count, unspecified: Secondary | ICD-10-CM | POA: Insufficient documentation

## 2013-03-27 DIAGNOSIS — D649 Anemia, unspecified: Secondary | ICD-10-CM | POA: Insufficient documentation

## 2013-03-27 LAB — COMPREHENSIVE METABOLIC PANEL
ALT: 9 U/L (ref 0–35)
Alkaline Phosphatase: 134 U/L — ABNORMAL HIGH (ref 39–117)
CO2: 25 mEq/L (ref 19–32)
Chloride: 97 mEq/L (ref 96–112)
GFR calc Af Amer: >90 mL/min (ref >90)
GFR calc non Af Amer: >90 mL/min (ref >90)
Glucose, Bld: 108 mg/dL — ABNORMAL HIGH (ref 70–99)
Potassium: 3.1 mEq/L — ABNORMAL LOW (ref 3.5–5.1)
Sodium: 133 mEq/L — ABNORMAL LOW (ref 135–145)
Total Protein: 6.9 g/dL (ref 6.0–8.3)

## 2013-03-27 LAB — URINALYSIS, ROUTINE W REFLEX MICROSCOPIC
Leukocytes, UA: NEGATIVE
Protein, ur: NEGATIVE mg/dL
Urobilinogen, UA: 0.2 mg/dL (ref 0.0–1.0)

## 2013-03-27 LAB — CBC
Hemoglobin: 9.1 g/dL — ABNORMAL LOW (ref 12.0–15.0)
MCH: 20.7 pg — ABNORMAL LOW (ref 26.0–34.0)
RBC: 4.4 MIL/uL (ref 3.87–5.11)

## 2013-03-27 LAB — WET PREP, GENITAL: Yeast Wet Prep HPF POC: NONE SEEN

## 2013-03-27 MED ORDER — ALUM & MAG HYDROXIDE-SIMETH 200-200-20 MG/5ML PO SUSP
30.0000 mL | Freq: Once | ORAL | Status: AC
  Administered 2013-03-27: 30 mL via ORAL
  Filled 2013-03-27: qty 30

## 2013-03-27 MED ORDER — IRON POLYSACCH CMPLX-B12-FA 150-0.025-1 MG PO CAPS: 1.0000 | ORAL_CAPSULE | Freq: Two times a day (BID) | ORAL | Status: DC

## 2013-03-27 NOTE — MAU Note (Signed)
Pt states she has been cramping for the last 2 ours

## 2013-03-27 NOTE — MAU Provider Note (Signed)
History     CSN: [id number]  Arrival date and time: 03/27/13 2053   None     Chief Complaint  Patient presents with  . Contractions   HPI Comments: Pt is a [id number] at [redacted]w[redacted]d that arrives unannounced w c/o abdominal cramping x2 hours. Pt states she had not eaten much all day and ate dinner about 1800 and then had vomiting and now abdominal cramping. States she's had 2 bottles of water today. Denies any VB or LOF, c/o white thin discharge. Unsure about feeling ctx.      Past Medical History  Diagnosis Date  . Hypokalemia 2007    IP Sept 2012  . Hypertension   . Primary aldosteronism   . Scoliosis   . Aldosteronism 2012  . H/O thalassemia   . H/O varicella   . H/O candidiasis   . Hx: UTI (urinary tract infection) 2010  . Sickle cell trait   . Yeast infection   . H/O bacterial infection   . Dermatitis 2007  . BV (bacterial vaginosis) 2008  . Monilial vaginitis 2008  . Yeast vaginitis 2010  . CIN I (cervical intraepithelial neoplasia I) 2010  . Postpartum hypertension 07/06/10  . Pregnancy induced hypertension 2011  . Abnormal Pap smear 2010    Colpo & LEEP; LAST PAP 09/2011  . Infection     UTI X 1  . Thalassemia minor     CHRONIC  . Migraine headache     OTC  . Migraine     Past Surgical History  Procedure Laterality Date  . Breast reduction surgery  Jan 2006  . Cesarean section  2005 2011  . Dilation and curettage of uterus  2003  . Wisdom tooth extraction      Family History  Problem Relation Age of Onset  . Thalassemia Mother   . Arthritis Mother   . Early death Mother 16  . Other Mother     GUILLON-BERRE SX; BLOOD CLOT  . Hypertension Father   . Heart attack Father   . Heart disease Father     MI  . Early death Father   . Kidney disease Father   . Breast cancer Maternal Aunt   . Breast cancer Maternal Grandmother   . Cancer Maternal Grandfather     Bone    History  Substance Use Topics  . Smoking status: Never Smoker   . Smokeless  tobacco: Never Used  . Alcohol Use: Yes     Comment: OCC    Allergies:  Allergies  Allergen Reactions  . Sulfa Antibiotics Other (See Comments)    Eyes turn blood shot red    Prescriptions prior to admission  Medication Sig Dispense Refill  . eplerenone (INSPRA) 25 MG tablet Take 25 mg by mouth 2 (two) times daily.      . potassium chloride SA (K-DUR,KLOR-CON) 20 MEQ tablet Take 40 mEq by mouth 2 (two) times daily.      . Prenatal Vit-Fe Fumarate-FA (PRENATAL MULTIVITAMIN) TABS Take 2 tablets by mouth daily at 12 noon.      . [DISCONTINUED] potassium chloride SA (K-DUR,KLOR-CON) 20 MEQ tablet Take 2 tablets (40 mEq total) by mouth 2 (two) times daily. TAKE TABLETS (40 MEQ) TWO TIMES DAILY  120 tablet  0    Review of Systems  Constitutional: Negative for fever.  Cardiovascular: Negative for chest pain and leg swelling.  Gastrointestinal: Positive for vomiting and abdominal pain. Negative for heartburn, diarrhea and constipation.  Genitourinary: Negative for dysuria.  Neurological: Negative for headaches.  All other systems reviewed and are negative.   Physical Exam   Blood pressure 156/94, pulse 105, temperature 98.3 F (36.8 C), resp. rate 16, height 5\' 7"  (1.702 m), weight 219 lb (99.338 kg), last menstrual period 08/17/2012, SpO2 99.00%.  Physical Exam  Nursing note and vitals reviewed. Constitutional: She is oriented to person, place, and time. She appears well-developed and well-nourished.  HENT:  Head: Normocephalic.  Eyes: Pupils are equal, round, and reactive to light.  Neck: Normal range of motion.  Cardiovascular: Normal rate, regular rhythm and normal heart sounds.   Respiratory: Effort normal and breath sounds normal.  GI: Soft. Bowel sounds are normal. There is no tenderness.  Genitourinary: Vagina normal.  Spec exam, sm amt white d/c in vault Cervix closed/50/unable to palpate pp,  Wet prep, GC/CT, FFN collected   Musculoskeletal: Normal range of motion.   Neurological: She is alert and oriented to person, place, and time. She has normal reflexes.  Skin: Skin is warm and dry.  Psychiatric: She has a normal mood and affect. Her behavior is normal.   FHR 130 toco- consistent UI, w occ tracing ctx  Results for orders placed during the hospital encounter of 03/27/13 (from the past 24 hour(s))  URINALYSIS, ROUTINE W REFLEX MICROSCOPIC     Status: None   Collection Time    03/27/13  9:05 PM      Result Value Range   Color, Urine YELLOW  YELLOW   APPearance CLEAR  CLEAR   Specific Gravity, Urine 1.020  1.005 - 1.030   pH 6.0  5.0 - 8.0   Glucose, UA NEGATIVE  NEGATIVE mg/dL   Hgb urine dipstick NEGATIVE  NEGATIVE   Bilirubin Urine NEGATIVE  NEGATIVE   Ketones, ur NEGATIVE  NEGATIVE mg/dL   Protein, ur NEGATIVE  NEGATIVE mg/dL   Urobilinogen, UA 0.2  0.0 - 1.0 mg/dL   Nitrite NEGATIVE  NEGATIVE   Leukocytes, UA NEGATIVE  NEGATIVE    Filed Vitals:   03/27/13 2127 03/27/13 2134  BP: 160/83 156/94  Pulse: 97 105  Temp: 98.3 F (36.8 C)   Resp: 16   Height: 5\' 7"  (1.702 m)   Weight: 219 lb (99.338 kg)   SpO2: 99%    170/88   MAU Course  Procedures FFN PIH labs   Assessment and Plan  IUP at [redacted]w[redacted]d R/o PTL Hypertension hyperaldosteronism - managed by Dr Lysle Morales prep/FFN pending GC/CT sent maalox PO  PO hydration  UA WNL  R/o pre-eclampsia CBC, CMET, LDH, uric acid, prot/creat ratio pending Will consult w Dr Dion Body when results returned    Mathieu Schloemer M 03/27/2013, 10:29 PM    Addendum at 2324  Pt feeling much better  FHR cat 1 toco quiet  BP slightly  improved Filed Vitals:   03/27/13 2127 03/27/13 2134 03/27/13 2225 03/27/13 2306  BP: 160/83 156/94 172/98 158/86  Pulse: 97 105 96 88  Temp: 98.3 F (36.8 C)     Resp: 16     Height: 5\' 7"  (1.702 m)     Weight: 219 lb (99.338 kg)     SpO2: 99%      Wet prep neg FFN neg  PIH labs pending   Will await PIH labs and anticipate d/c home, with f/u  office this week for BP check  D/w Dr Melven Sartorius, CNM    Addendum: at 2348  Results for orders placed during the hospital encounter of 03/27/13 (from the  past 24 hour(s))  URINALYSIS, ROUTINE W REFLEX MICROSCOPIC     Status: None   Collection Time    03/27/13  9:05 PM      Result Value Range   Color, Urine YELLOW  YELLOW   APPearance CLEAR  CLEAR   Specific Gravity, Urine 1.020  1.005 - 1.030   pH 6.0  5.0 - 8.0   Glucose, UA NEGATIVE  NEGATIVE mg/dL   Hgb urine dipstick NEGATIVE  NEGATIVE   Bilirubin Urine NEGATIVE  NEGATIVE   Ketones, ur NEGATIVE  NEGATIVE mg/dL   Protein, ur NEGATIVE  NEGATIVE mg/dL   Urobilinogen, UA 0.2  0.0 - 1.0 mg/dL   Nitrite NEGATIVE  NEGATIVE   Leukocytes, UA NEGATIVE  NEGATIVE  WET PREP, GENITAL     Status: Abnormal   Collection Time    03/27/13 10:25 PM      Result Value Range   Yeast Wet Prep HPF POC NONE SEEN  NONE SEEN   Trich, Wet Prep NONE SEEN  NONE SEEN   Clue Cells Wet Prep HPF POC NONE SEEN  NONE SEEN   WBC, Wet Prep HPF POC FEW (*) NONE SEEN  FETAL FIBRONECTIN     Status: None   Collection Time    03/27/13 10:25 PM      Result Value Range   Fetal Fibronectin NEGATIVE  NEGATIVE  COMPREHENSIVE METABOLIC PANEL     Status: Abnormal   Collection Time    03/27/13 10:55 PM      Result Value Range   Sodium 133 (*) 135 - 145 mEq/L   Potassium 3.1 (*) 3.5 - 5.1 mEq/L   Chloride 97  96 - 112 mEq/L   CO2 25  19 - 32 mEq/L   Glucose, Bld 108 (*) 70 - 99 mg/dL   BUN 7  6 - 23 mg/dL   Creatinine, Ser 1.61  0.50 - 1.10 mg/dL   Calcium 8.6  8.4 - 09.6 mg/dL   Total Protein 6.9  6.0 - 8.3 g/dL   Albumin 2.4 (*) 3.5 - 5.2 g/dL   AST 13  0 - 37 U/L   ALT 9  0 - 35 U/L   Alkaline Phosphatase 134 (*) 39 - 117 U/L   Total Bilirubin 0.3  0.3 - 1.2 mg/dL   GFR calc non Af Amer >90  >90 mL/min   GFR calc Af Amer >90  >90 mL/min  LACTATE DEHYDROGENASE     Status: None   Collection Time    03/27/13 10:55 PM      Result Value Range    LDH 169  94 - 250 U/L  URIC ACID     Status: None   Collection Time    03/27/13 10:55 PM      Result Value Range   Uric Acid, Serum 4.3  2.4 - 7.0 mg/dL  CBC     Status: Abnormal   Collection Time    03/27/13 10:55 PM      Result Value Range   WBC 12.6 (*) 4.0 - 10.5 K/uL   RBC 4.40  3.87 - 5.11 MIL/uL   Hemoglobin 9.1 (*) 12.0 - 15.0 g/dL   HCT 04.5 (*) 40.9 - 81.1 %   MCV 65.5 (*) 78.0 - 100.0 fL   MCH 20.7 (*) 26.0 - 34.0 pg   MCHC 31.6  30.0 - 36.0 g/dL   RDW 91.4 (*) 78.2 - 95.6 %   Platelets 315  150 - 400 K/uL  Hypokalemia - known hx Anemia - known hx Leukocytosis - known hx   PIH labs otherwise normal Prot/creat ratio - pending  Pt dc'd home w PIH precautions, rv'd importance of continued compliance w medications RX niferex PO BID #60 F/u MAU on Sunday for BP check, office visits as scheduled, also has appt w Dr Sharl Ma beginning of Aug  S.Esmeralda Malay, [place]

## 2013-03-28 LAB — PROTEIN / CREATININE RATIO, URINE: Total Protein, Urine: 52.7 mg/dL

## 2013-04-13 ENCOUNTER — Inpatient Hospital Stay (HOSPITAL_COMMUNITY)
Admission: AD | Admit: 2013-04-13 | Discharge: 2013-04-13 | Disposition: A | Discharge status: Home / Self Care | Payer: BC Managed Care – PPO | Source: Ambulatory Visit | Attending: Obstetrics and Gynecology | Admitting: Obstetrics and Gynecology

## 2013-04-13 ENCOUNTER — Encounter (HOSPITAL_COMMUNITY): Payer: Self-pay | Admitting: *Deleted

## 2013-04-13 DIAGNOSIS — O4700 False labor before 37 completed weeks of gestation, unspecified trimester: Secondary | ICD-10-CM | POA: Insufficient documentation

## 2013-04-13 DIAGNOSIS — O26893 Other specified pregnancy related conditions, third trimester: Secondary | ICD-10-CM

## 2013-04-13 DIAGNOSIS — R102 Pelvic and perineal pain: Secondary | ICD-10-CM

## 2013-04-13 DIAGNOSIS — O10019 Pre-existing essential hypertension complicating pregnancy, unspecified trimester: Secondary | ICD-10-CM | POA: Insufficient documentation

## 2013-04-13 DIAGNOSIS — N949 Unspecified condition associated with female genital organs and menstrual cycle: Secondary | ICD-10-CM | POA: Insufficient documentation

## 2013-04-13 DIAGNOSIS — O34219 Maternal care for unspecified type scar from previous cesarean delivery: Secondary | ICD-10-CM | POA: Insufficient documentation

## 2013-04-13 LAB — URINALYSIS, ROUTINE W REFLEX MICROSCOPIC
Bilirubin Urine: NEGATIVE
Ketones, ur: NEGATIVE mg/dL
Nitrite: NEGATIVE
Protein, ur: NEGATIVE mg/dL
Urobilinogen, UA: 1 mg/dL (ref 0.0–1.0)

## 2013-04-13 LAB — URINE MICROSCOPIC-ADD ON

## 2013-04-13 MED ORDER — ACETAMINOPHEN-CODEINE #3 300-30 MG PO TABS: 1.0000 | ORAL_TABLET | ORAL | Status: DC | PRN

## 2013-04-13 NOTE — MAU Provider Note (Signed)
History   Krista [name] is a 31y.o. BF at [redacted]w[redacted]d who presents for PTL eval w/ CC of pelvic pressure and lower abdominal pain/pressure today, that is present mainly just w/ standing.  States when sitting or supine, doesn't feel pressure. No VB or LOF.  No abnl d/c or odor. Normal FM.  No UTI or PIH s/s.  She is getting weekly u/s.  Due to see Dr. Sharl Ma (endocrinologist) this month.  No GI or resp c/o's.    CSN: [id number]  Arrival date and time: 04/13/13 1558   First Provider Initiated Contact with Patient 04/13/13 1632      Chief Complaint  Patient presents with  . Pelvic Pain   HPI  OB History   Grav Para Term Preterm Abortions TAB SAB Ect Mult Living   7 2 2  0 4 1 3  1 3       Past Medical History  Diagnosis Date  . Hypokalemia 2007    IP Sept 2012  . Hypertension   . Primary aldosteronism   . Scoliosis   . Aldosteronism 2012  . H/O thalassemia   . H/O varicella   . H/O candidiasis   . Hx: UTI (urinary tract infection) 2010  . Sickle cell trait   . Yeast infection   . H/O bacterial infection   . Dermatitis 2007  . BV (bacterial vaginosis) 2008  . Monilial vaginitis 2008  . Yeast vaginitis 2010  . CIN I (cervical intraepithelial neoplasia I) 2010  . Postpartum hypertension 07/06/10  . Pregnancy induced hypertension 2011  . Abnormal Pap smear 2010    Colpo & LEEP; LAST PAP 09/2011  . Infection     UTI X 1  . Thalassemia minor     CHRONIC  . Migraine headache     OTC  . Migraine     Past Surgical History  Procedure Laterality Date  . Breast reduction surgery  Jan 2006  . Cesarean section  2005 2011  . Dilation and curettage of uterus  2003  . Wisdom tooth extraction      Family History  Problem Relation Age of Onset  . Thalassemia Mother   . Arthritis Mother   . Early death Mother 69  . Other Mother     GUILLON-BERRE SX; BLOOD CLOT  . Hypertension Father   . Heart attack Father   . Heart disease Father     MI  . Early death Father   . Kidney  disease Father   . Breast cancer Maternal Aunt   . Breast cancer Maternal Grandmother   . Cancer Maternal Grandfather     Bone    History  Substance Use Topics  . Smoking status: Never Smoker   . Smokeless tobacco: Never Used  . Alcohol Use: Yes     Comment: OCC    Allergies:  Allergies  Allergen Reactions  . Sulfa Antibiotics Other (See Comments)    Eyes turn blood shot red    Prescriptions prior to admission  Medication Sig Dispense Refill  . eplerenone (INSPRA) 25 MG tablet Take 25 mg by mouth 2 (two) times daily.      . Iron Polysacch Cmplx-B12-FA 150-0.025-1 MG CAPS Take 1 tablet by mouth 2 (two) times daily.  60 each  2  . potassium chloride SA (K-DUR,KLOR-CON) 20 MEQ tablet Take 40 mEq by mouth 2 (two) times daily.      . Prenatal Vit-Fe Fumarate-FA (PRENATAL MULTIVITAMIN) TABS Take 2 tablets by mouth daily at 12 noon.  ROS--see HPI Physical Exam   Blood pressure 155/99, pulse 93, temperature 98.1 F (36.7 C), temperature source Oral, resp. rate 16, height 5\' 7"  (1.702 m), weight 221 lb 3.2 oz (100.336 kg), last menstrual period 08/17/2012. .. Filed Vitals:   04/13/13 1655  BP: 166/89  Pulse: 91  Temp:   Resp: 18   Physical Exam  Constitutional: She is oriented to person, place, and time. She appears well-developed and well-nourished. No distress.  HENT:  Head: Normocephalic and atraumatic.  Eyes: Pupils are equal, round, and reactive to light.  Cardiovascular: Normal rate.   Respiratory: Effort normal.  GI: Soft.  gravid  Genitourinary:  Closed/30/-2, vtx; medium firmness  Neurological: She is alert and oriented to person, place, and time.  Skin: Skin is warm and dry.  Psychiatric: She has a normal mood and affect. Her behavior is normal. Judgment and thought content normal.    MAU Course  Procedures 1. NST: 135, reactive, mod variability, no decels; TOCO: no discernable ctxs  Assessment and Plan  1. [redacted]w[redacted]d 2. Pelvic pain 3. No s/s of  PTL 4. Hyperaldosteronism w/ CHTN 5. Previous c/s x2  1. D/c home w PTL precautions; rec'd maternity band; other comfort measures rev'd 2. Rx called into [facility] on [address] for Tylenol #3 #30 w/ 1 RF 3. Has u/s and ROB tomorrow at [facility] at noon, or f/u prn 4. Note requested to remain OOW until Wed 04/15/13--given  Berish Bohman H 04/13/2013, 4:53 PM

## 2013-04-13 NOTE — MAU Note (Signed)
Pt reports having increased pelvic pressure and pain . Difficult to walk. Reports a white vaginal discharge and good fetal movement.

## 2013-04-14 ENCOUNTER — Encounter (HOSPITAL_COMMUNITY): Payer: Self-pay | Admitting: *Deleted

## 2013-04-14 ENCOUNTER — Inpatient Hospital Stay (HOSPITAL_COMMUNITY)
Admission: AD | Admit: 2013-04-14 | Discharge: 2013-04-14 | Disposition: A | Discharge status: Home / Self Care | Payer: BC Managed Care – PPO | Source: Ambulatory Visit | Attending: Obstetrics and Gynecology | Admitting: Obstetrics and Gynecology

## 2013-04-14 DIAGNOSIS — R030 Elevated blood-pressure reading, without diagnosis of hypertension: Secondary | ICD-10-CM | POA: Insufficient documentation

## 2013-04-14 DIAGNOSIS — E269 Hyperaldosteronism, unspecified: Secondary | ICD-10-CM | POA: Insufficient documentation

## 2013-04-14 DIAGNOSIS — O99891 Other specified diseases and conditions complicating pregnancy: Secondary | ICD-10-CM | POA: Insufficient documentation

## 2013-04-14 LAB — COMPREHENSIVE METABOLIC PANEL
Albumin: 2.3 g/dL — ABNORMAL LOW (ref 3.5–5.2)
Alkaline Phosphatase: 169 U/L — ABNORMAL HIGH (ref 39–117)
BUN: 5 mg/dL — ABNORMAL LOW (ref 6–23)
Creatinine, Ser: 0.6 mg/dL (ref 0.50–1.10)
GFR calc Af Amer: >90 mL/min (ref >90)
Glucose, Bld: 92 mg/dL (ref 70–99)
Potassium: 2.7 mEq/L — CL (ref 3.5–5.1)
Total Bilirubin: 0.3 mg/dL (ref 0.3–1.2)
Total Protein: 6.7 g/dL (ref 6.0–8.3)

## 2013-04-14 LAB — CBC
HCT: 27 % — ABNORMAL LOW (ref 36.0–46.0)
Hemoglobin: 8.6 g/dL — ABNORMAL LOW (ref 12.0–15.0)
MCHC: 31.9 g/dL (ref 30.0–36.0)
MCV: 64.3 fL — ABNORMAL LOW (ref 78.0–100.0)
RDW: 16.1 % — ABNORMAL HIGH (ref 11.5–15.5)

## 2013-04-14 LAB — PROTEIN / CREATININE RATIO, URINE: Protein Creatinine Ratio: 0.17 — ABNORMAL HIGH (ref 0.00–0.15)

## 2013-04-14 LAB — URIC ACID: Uric Acid, Serum: 4.4 mg/dL (ref 2.4–7.0)

## 2013-04-14 LAB — LACTATE DEHYDROGENASE: LDH: 172 U/L (ref 94–250)

## 2013-04-14 MED ORDER — LABETALOL HCL 200 MG PO TABS: 200.0000 mg | ORAL_TABLET | Freq: Two times a day (BID) | ORAL | Status: DC

## 2013-04-14 MED ORDER — POTASSIUM CHLORIDE CRYS ER 20 MEQ PO TBCR: 20.0000 meq | EXTENDED_RELEASE_TABLET | Freq: Three times a day (TID) | ORAL | Status: DC

## 2013-04-14 NOTE — MAU Note (Signed)
Pt states she was seen at the MD office and informed that her BP were elevated and pt was sent over her

## 2013-04-14 NOTE — MAU Provider Note (Signed)
History   32 yo [id number] at [redacted]w[redacted]d was sent over to the office d/t elevated BP.  In the office she had a BPP BPP 8/10 (no fetal movements), pt told provider that she "wants to go to MAU for eval rather than repeating BP and outpt eval. Denies VB, UCs, LOF, recent fever, resp or GI c/o's, UTI or PIH s/s.  Chief Complaint  Patient presents with  . Hypertension    OB History   Grav Para Term Preterm Abortions TAB SAB Ect Mult Living   7 2 2  0 4 1 3  1 3       Past Medical History  Diagnosis Date  . Hypokalemia 2007    IP Sept 2012  . Hypertension   . Primary aldosteronism   . Scoliosis   . Aldosteronism 2012  . H/O thalassemia   . H/O varicella   . H/O candidiasis   . Hx: UTI (urinary tract infection) 2010  . Sickle cell trait   . Yeast infection   . H/O bacterial infection   . Dermatitis 2007  . BV (bacterial vaginosis) 2008  . Monilial vaginitis 2008  . Yeast vaginitis 2010  . CIN I (cervical intraepithelial neoplasia I) 2010  . Postpartum hypertension 07/06/10  . Pregnancy induced hypertension 2011  . Abnormal Pap smear 2010    Colpo & LEEP; LAST PAP 09/2011  . Infection     UTI X 1  . Thalassemia minor     CHRONIC  . Migraine headache     OTC  . Migraine     Past Surgical History  Procedure Laterality Date  . Breast reduction surgery  Jan 2006  . Cesarean section  2005 2011  . Dilation and curettage of uterus  2003  . Wisdom tooth extraction      Family History  Problem Relation Age of Onset  . Thalassemia Mother   . Arthritis Mother   . Early death Mother 31  . Other Mother     GUILLON-BERRE SX; BLOOD CLOT  . Hypertension Father   . Heart attack Father   . Heart disease Father     MI  . Early death Father   . Kidney disease Father   . Breast cancer Maternal Aunt   . Breast cancer Maternal Grandmother   . Cancer Maternal Grandfather     Bone    History  Substance Use Topics  . Smoking status: Never Smoker   . Smokeless tobacco: Never Used  .  Alcohol Use: Yes     Comment: OCC    Allergies:  Allergies  Allergen Reactions  . Sulfa Antibiotics Other (See Comments)    Eyes turn blood shot red    Prescriptions prior to admission  Medication Sig Dispense Refill  . acetaminophen-codeine (TYLENOL #3) 300-30 MG per tablet Take 1-2 tablets by mouth every 4 (four) hours as needed for pain.  30 tablet  1  . eplerenone (INSPRA) 25 MG tablet Take 25 mg by mouth 2 (two) times daily.      . Iron Polysacch Cmplx-B12-FA 150-0.025-1 MG CAPS Take 1 tablet by mouth 2 (two) times daily.  60 each  2  . potassium chloride SA (K-DUR,KLOR-CON) 20 MEQ tablet Take 40 mEq by mouth 2 (two) times daily.      . Prenatal Vit-Fe Fumarate-FA (PRENATAL MULTIVITAMIN) TABS Take 2 tablets by mouth daily at 12 noon.        ROS: see HPI above, all other systems are negative   Physical  Exam   Blood pressure 171/92, pulse 89, temperature 98.4 F (36.9 C), temperature source Oral, resp. rate 20, height 5\' 7"  (1.702 m), weight 222 lb 2 oz (100.755 kg), last menstrual period 08/17/2012.  Chest: Clear Heart: RRR Abdomen: gravid, NT Extremities: WNL  FHT: Reactive NST UCs: Occasional  ED Course  IUP at [redacted]w[redacted]d PIH evaluation H/o severe Pre-E Uncontrolled hyperaldosteronism Cancelled last appointment with Endocrinologist d/t financial reasons - has insurance now and plans to see Endo at the end of Aug.  C/w Dr. Estanislado Pandy Labetalol 200mg  BID Increase K-Dur from BID to TID Continue to take the Inspira Return to [facility] for a blood pressure check in 48 hours. Next scheduled appointment at [facility] 8/19   Haroldine Laws CNM, MSN 04/14/2013 4:05 PM

## 2013-04-18 ENCOUNTER — Encounter (HOSPITAL_COMMUNITY): Payer: Self-pay

## 2013-04-18 ENCOUNTER — Inpatient Hospital Stay (HOSPITAL_COMMUNITY)
Admission: AD | Admit: 2013-04-18 | Discharge: 2013-04-18 | Disposition: A | Discharge status: Home / Self Care | Payer: BC Managed Care – PPO | Source: Ambulatory Visit | Attending: Obstetrics and Gynecology | Admitting: Obstetrics and Gynecology

## 2013-04-18 DIAGNOSIS — I10 Essential (primary) hypertension: Secondary | ICD-10-CM

## 2013-04-18 DIAGNOSIS — E269 Hyperaldosteronism, unspecified: Secondary | ICD-10-CM

## 2013-04-18 DIAGNOSIS — E876 Hypokalemia: Secondary | ICD-10-CM | POA: Insufficient documentation

## 2013-04-18 DIAGNOSIS — IMO0001 Reserved for inherently not codable concepts without codable children: Secondary | ICD-10-CM | POA: Insufficient documentation

## 2013-04-18 LAB — COMPREHENSIVE METABOLIC PANEL
BUN: 5 mg/dL — ABNORMAL LOW (ref 6–23)
Calcium: 8.7 mg/dL (ref 8.4–10.5)
Creatinine, Ser: 0.6 mg/dL (ref 0.50–1.10)
GFR calc Af Amer: >90 mL/min (ref >90)
GFR calc non Af Amer: >90 mL/min (ref >90)
Glucose, Bld: 107 mg/dL — ABNORMAL HIGH (ref 70–99)
Sodium: 137 mEq/L (ref 135–145)
Total Protein: 6.1 g/dL (ref 6.0–8.3)

## 2013-04-18 LAB — URINALYSIS, ROUTINE W REFLEX MICROSCOPIC
Glucose, UA: NEGATIVE mg/dL
Nitrite: NEGATIVE
Protein, ur: NEGATIVE mg/dL
Urobilinogen, UA: 0.2 mg/dL (ref 0.0–1.0)

## 2013-04-18 LAB — URINE MICROSCOPIC-ADD ON

## 2013-04-18 LAB — PROTEIN / CREATININE RATIO, URINE: Creatinine, Urine: 34.64 mg/dL

## 2013-04-18 LAB — LACTATE DEHYDROGENASE: LDH: 178 U/L (ref 94–250)

## 2013-04-18 LAB — CBC
HCT: 26.7 % — ABNORMAL LOW (ref 36.0–46.0)
Hemoglobin: 8.4 g/dL — ABNORMAL LOW (ref 12.0–15.0)
WBC: 9.2 10*3/uL (ref 4.0–10.5)

## 2013-04-18 LAB — URIC ACID: Uric Acid, Serum: 4.4 mg/dL (ref 2.4–7.0)

## 2013-04-18 NOTE — MAU Note (Signed)
Pt states bp has been elevated for past week, was started on labetalol Tuesday, took last dose this am at 0830.

## 2013-04-18 NOTE — MAU Provider Note (Signed)
History    CSN: [id number]  Arrival date and time: 04/18/13 1113   First Provider Initiated Contact with Patient 04/18/13 1218      Chief Complaint  Patient presents with  . Hypertension   HPI Pt presents unannounced to MAU for BP check.  States she was instructed by Dr. Estanislado Pandy to come in today to have BP checked.  States she was seen in the office yesterday and at that time her Labetalol was increased to 400mg  tid.  She denies any headache but states she did have "sparkles in vision" last night while shopping at [facility].  Denies any current vision changes.  Denies RUQ pain, edema or severe heartburn.  Reports her fetus is active.  Denies UCs, ROM or bldg.  States she has not taken her potassium supplement today.   OB History   Grav Para Term Preterm Abortions TAB SAB Ect Mult Living   7 2 2  0 4 1 3  1 3       Past Medical History  Diagnosis Date  . Hypokalemia 2007    IP Sept 2012  . Hypertension   . Primary aldosteronism   . Scoliosis   . Aldosteronism 2012  . H/O thalassemia   . H/O varicella   . H/O candidiasis   . Hx: UTI (urinary tract infection) 2010  . Sickle cell trait   . Yeast infection   . H/O bacterial infection   . Dermatitis 2007  . BV (bacterial vaginosis) 2008  . Monilial vaginitis 2008  . Yeast vaginitis 2010  . CIN I (cervical intraepithelial neoplasia I) 2010  . Postpartum hypertension 07/06/10  . Pregnancy induced hypertension 2011  . Abnormal Pap smear 2010    Colpo & LEEP; LAST PAP 09/2011  . Infection     UTI X 1  . Thalassemia minor     CHRONIC  . Migraine headache     OTC  . Migraine     Past Surgical History  Procedure Laterality Date  . Breast reduction surgery  Jan 2006  . Cesarean section  2005 2011  . Dilation and curettage of uterus  2003  . Wisdom tooth extraction      Family History  Problem Relation Age of Onset  . Thalassemia Mother   . Arthritis Mother   . Early death Mother 56  . Other Mother     GUILLON-BERRE  SX; BLOOD CLOT  . Hypertension Father   . Heart attack Father   . Heart disease Father     MI  . Early death Father   . Kidney disease Father   . Breast cancer Maternal Aunt   . Breast cancer Maternal Grandmother   . Cancer Maternal Grandfather     Bone    History  Substance Use Topics  . Smoking status: Never Smoker   . Smokeless tobacco: Never Used  . Alcohol Use: No     Comment: OCC    Allergies:  Allergies  Allergen Reactions  . Sulfa Antibiotics Other (See Comments)    Eyes turn blood shot red    Prescriptions prior to admission  Medication Sig Dispense Refill  . labetalol (NORMODYNE) 200 MG tablet Take 400 mg by mouth 3 (three) times daily.      Marland Kitchen acetaminophen-codeine (TYLENOL #3) 300-30 MG per tablet Take 1-2 tablets by mouth every 4 (four) hours as needed for pain.  30 tablet  1  . eplerenone (INSPRA) 25 MG tablet Take 25 mg by mouth 2 (two)  times daily.      . Iron Polysacch Cmplx-B12-FA 150-0.025-1 MG CAPS Take 1 tablet by mouth 2 (two) times daily.  60 each  2  . labetalol (NORMODYNE) 200 MG tablet Take 1 tablet (200 mg total) by mouth 2 (two) times daily.  60 tablet  3  . potassium chloride SA (K-DUR,KLOR-CON) 20 MEQ tablet Take 1 tablet (20 mEq total) by mouth 3 (three) times daily.  90 tablet  6  . Prenatal Vit-Fe Fumarate-FA (PRENATAL MULTIVITAMIN) TABS Take 2 tablets by mouth daily at 12 noon.        Review of Systems  Constitutional: Negative.   HENT: Negative.   Eyes: Negative.   Respiratory: Negative.   Cardiovascular: Negative.   Gastrointestinal: Negative.   Genitourinary: Negative.   Musculoskeletal: Negative.   Skin: Negative.   Endo/Heme/Allergies: Negative.   Psychiatric/Behavioral: Negative.    Physical Exam   Blood pressure 149/88, pulse 99, temperature 98 F (36.7 C), temperature source Oral, resp. rate 16, height 5\' 7"  (1.702 m), weight 101.662 kg (224 lb 2 oz), last menstrual period 08/17/2012, SpO2 99.00%. Filed Vitals:    04/18/13 1317 04/18/13 1318 04/18/13 1332 04/18/13 1347  BP: 151/81  147/80 118/87  Pulse: 91 92 105 100  Temp:      TempSrc:      Resp:      Height:      Weight:      SpO2: 99%  100% 99%   Physical Exam  Constitutional: She is oriented to person, place, and time. She appears well-developed and well-nourished.  HENT:  Head: Normocephalic and atraumatic.  Right Ear: External ear normal.  Left Ear: External ear normal.  Nose: Nose normal.  Eyes: Conjunctivae are normal. Pupils are equal, round, and reactive to light.  Neck: Normal range of motion. Neck supple.  Cardiovascular: Normal rate, regular rhythm and intact distal pulses.   Respiratory: Effort normal and breath sounds normal.  GI: Soft. Bowel sounds are normal. There is no tenderness. There is no rebound and no guarding.  Gravid  Genitourinary: Uterus normal.  Uterus soft, gravid, non-tender. Pelvic exam otherwise deferred.    Musculoskeletal: Normal range of motion.  Neurological: She is alert and oriented to person, place, and time. She has normal reflexes.  Skin: Skin is warm and dry.  Psychiatric: She has a normal mood and affect.   FHR baseline 130bpm; variability-moderate; accels-present; decels-absent. Toco: No UCs but uterine irritability noted.    MAU Course  Procedures Results for orders placed during the hospital encounter of 04/18/13 (from the past 24 hour(s))  URINALYSIS, ROUTINE W REFLEX MICROSCOPIC     Status: Abnormal   Collection Time    04/18/13 11:34 AM      Result Value Range   Color, Urine YELLOW  YELLOW   APPearance CLEAR  CLEAR   Specific Gravity, Urine <1.005 (*) 1.005 - 1.030   pH 6.0  5.0 - 8.0   Glucose, UA NEGATIVE  NEGATIVE mg/dL   Hgb urine dipstick NEGATIVE  NEGATIVE   Bilirubin Urine NEGATIVE  NEGATIVE   Ketones, ur NEGATIVE  NEGATIVE mg/dL   Protein, ur NEGATIVE  NEGATIVE mg/dL   Urobilinogen, UA 0.2  0.0 - 1.0 mg/dL   Nitrite NEGATIVE  NEGATIVE   Leukocytes, UA MODERATE (*)  NEGATIVE  PROTEIN / CREATININE RATIO, URINE     Status: Abnormal   Collection Time    04/18/13 11:34 AM      Result Value Range   Creatinine, Urine  34.64     Total Protein, Urine 6.9     PROTEIN CREATININE RATIO 0.20 (*) 0.00 - 0.15  URINE MICROSCOPIC-ADD ON     Status: Abnormal   Collection Time    04/18/13 11:34 AM      Result Value Range   Squamous Epithelial / LPF FEW (*) RARE   WBC, UA 7-10  <3 WBC/hpf   RBC / HPF 0-2  <3 RBC/hpf   Bacteria, UA MANY (*) RARE  COMPREHENSIVE METABOLIC PANEL     Status: Abnormal   Collection Time    04/18/13 12:14 PM      Result Value Range   Sodium 137  135 - 145 mEq/L   Potassium 2.8 (*) 3.5 - 5.1 mEq/L   Chloride 102  96 - 112 mEq/L   CO2 23  19 - 32 mEq/L   Glucose, Bld 107 (*) 70 - 99 mg/dL   BUN 5 (*) 6 - 23 mg/dL   Creatinine, Ser 1.61  0.50 - 1.10 mg/dL   Calcium 8.7  8.4 - 09.6 mg/dL   Total Protein 6.1  6.0 - 8.3 g/dL   Albumin 2.2 (*) 3.5 - 5.2 g/dL   AST 13  0 - 37 U/L   ALT 7  0 - 35 U/L   Alkaline Phosphatase 174 (*) 39 - 117 U/L   Total Bilirubin 0.2 (*) 0.3 - 1.2 mg/dL   GFR calc non Af Amer >90  >90 mL/min   GFR calc Af Amer >90  >90 mL/min  CBC     Status: Abnormal   Collection Time    04/18/13 12:14 PM      Result Value Range   WBC 9.2  4.0 - 10.5 K/uL   RBC 4.12  3.87 - 5.11 MIL/uL   Hemoglobin 8.4 (*) 12.0 - 15.0 g/dL   HCT 04.5 (*) 40.9 - 81.1 %   MCV 64.8 (*) 78.0 - 100.0 fL   MCH 20.4 (*) 26.0 - 34.0 pg   MCHC 31.5  30.0 - 36.0 g/dL   RDW 91.4 (*) 78.2 - 95.6 %   Platelets 243  150 - 400 K/uL  URIC ACID     Status: None   Collection Time    04/18/13 12:14 PM      Result Value Range   Uric Acid, Serum 4.4  2.4 - 7.0 mg/dL  LACTATE DEHYDROGENASE     Status: None   Collection Time    04/18/13 12:14 PM      Result Value Range   LDH 178  94 - 250 U/L    Assessment and Plan  IUP at 34w 6d Chronic hypertension without evidence of preeclampsia. Hyperaldosteronism Hypokalemia  Consult obtained with  Dr. Estanislado Pandy. Pt to f/u with endocrine as previously scheduled and discussed importance of this visit with the patient. Continue with Labetalol 400mg  tid as prescribed yesterday, 04/17/13.  Cassandra Harbold O. 04/18/2013, 1:12 PM

## 2013-04-21 ENCOUNTER — Other Ambulatory Visit: Payer: Self-pay

## 2013-04-21 ENCOUNTER — Other Ambulatory Visit: Payer: Self-pay | Admitting: Obstetrics and Gynecology

## 2013-04-21 LAB — URINE CULTURE

## 2013-05-05 ENCOUNTER — Ambulatory Visit (HOSPITAL_COMMUNITY)
Admission: RE | Admit: 2013-05-05 | Discharge: 2013-05-05 | Disposition: A | Discharge status: Home / Self Care | Payer: BC Managed Care – PPO | Source: Ambulatory Visit | Attending: Obstetrics and Gynecology | Admitting: Obstetrics and Gynecology

## 2013-05-05 ENCOUNTER — Encounter (HOSPITAL_COMMUNITY)
Admission: AD | Disposition: A | Discharge status: Home / Self Care | Payer: Self-pay | Source: Ambulatory Visit | Attending: Obstetrics and Gynecology

## 2013-05-05 ENCOUNTER — Inpatient Hospital Stay (HOSPITAL_COMMUNITY)
Admission: RE | Admit: 2013-05-05 | Payer: BC Managed Care – PPO | Source: Ambulatory Visit | Admitting: Obstetrics and Gynecology

## 2013-05-05 ENCOUNTER — Inpatient Hospital Stay (HOSPITAL_COMMUNITY)
Admission: AD | Admit: 2013-05-05 | Discharge: 2013-05-08 | DRG: 650 | Disposition: A | Discharge status: Home / Self Care | Payer: BC Managed Care – PPO | Source: Ambulatory Visit | Attending: Obstetrics and Gynecology | Admitting: Obstetrics and Gynecology

## 2013-05-05 ENCOUNTER — Encounter (HOSPITAL_COMMUNITY): Payer: Self-pay | Admitting: Anesthesiology

## 2013-05-05 ENCOUNTER — Inpatient Hospital Stay (HOSPITAL_COMMUNITY): Payer: BC Managed Care – PPO | Admitting: Anesthesiology

## 2013-05-05 ENCOUNTER — Encounter (HOSPITAL_COMMUNITY): Payer: Self-pay | Admitting: *Deleted

## 2013-05-05 VITALS — BP 199/114 | HR 95 | Wt 226.0 lb

## 2013-05-05 DIAGNOSIS — D649 Anemia, unspecified: Secondary | ICD-10-CM | POA: Diagnosis present

## 2013-05-05 DIAGNOSIS — Z98891 History of uterine scar from previous surgery: Secondary | ICD-10-CM

## 2013-05-05 DIAGNOSIS — O219 Vomiting of pregnancy, unspecified: Secondary | ICD-10-CM

## 2013-05-05 DIAGNOSIS — Z9889 Other specified postprocedural states: Secondary | ICD-10-CM

## 2013-05-05 DIAGNOSIS — D573 Sickle-cell trait: Secondary | ICD-10-CM

## 2013-05-05 DIAGNOSIS — N390 Urinary tract infection, site not specified: Secondary | ICD-10-CM | POA: Diagnosis present

## 2013-05-05 DIAGNOSIS — O039 Complete or unspecified spontaneous abortion without complication: Secondary | ICD-10-CM

## 2013-05-05 DIAGNOSIS — Z302 Encounter for sterilization: Secondary | ICD-10-CM

## 2013-05-05 DIAGNOSIS — O133 Gestational [pregnancy-induced] hypertension without significant proteinuria, third trimester: Secondary | ICD-10-CM

## 2013-05-05 DIAGNOSIS — E269 Hyperaldosteronism, unspecified: Secondary | ICD-10-CM | POA: Diagnosis present

## 2013-05-05 DIAGNOSIS — O34219 Maternal care for unspecified type scar from previous cesarean delivery: Secondary | ICD-10-CM | POA: Diagnosis present

## 2013-05-05 DIAGNOSIS — A498 Other bacterial infections of unspecified site: Secondary | ICD-10-CM

## 2013-05-05 DIAGNOSIS — O139 Gestational [pregnancy-induced] hypertension without significant proteinuria, unspecified trimester: Secondary | ICD-10-CM | POA: Diagnosis present

## 2013-05-05 DIAGNOSIS — O9903 Anemia complicating the puerperium: Secondary | ICD-10-CM | POA: Diagnosis not present

## 2013-05-05 DIAGNOSIS — O2390 Unspecified genitourinary tract infection in pregnancy, unspecified trimester: Secondary | ICD-10-CM | POA: Diagnosis present

## 2013-05-05 DIAGNOSIS — IMO0002 Reserved for concepts with insufficient information to code with codable children: Principal | ICD-10-CM | POA: Diagnosis present

## 2013-05-05 DIAGNOSIS — I10 Essential (primary) hypertension: Secondary | ICD-10-CM | POA: Diagnosis present

## 2013-05-05 LAB — COMPREHENSIVE METABOLIC PANEL
ALT: 7 U/L (ref 0–35)
AST: 14 U/L (ref 0–37)
Albumin: 2.4 g/dL — ABNORMAL LOW (ref 3.5–5.2)
CO2: 20 mEq/L (ref 19–32)
Calcium: 9.6 mg/dL (ref 8.4–10.5)
Creatinine, Ser: 0.64 mg/dL (ref 0.50–1.10)
Sodium: 135 mEq/L (ref 135–145)

## 2013-05-05 LAB — PROTEIN / CREATININE RATIO, URINE
Creatinine, Urine: 36.72 mg/dL
Protein Creatinine Ratio: 0.29 — ABNORMAL HIGH (ref 0.00–0.15)
Total Protein, Urine: 10.7 mg/dL

## 2013-05-05 LAB — RPR: RPR Ser Ql: NONREACTIVE

## 2013-05-05 LAB — CBC
Hemoglobin: 9.7 g/dL — ABNORMAL LOW (ref 12.0–15.0)
MCH: 20.2 pg — ABNORMAL LOW (ref 26.0–34.0)
MCHC: 31.5 g/dL (ref 30.0–36.0)
Platelets: 284 10*3/uL (ref 150–400)
RBC: 4.81 MIL/uL (ref 3.87–5.11)

## 2013-05-05 LAB — ABO/RH: ABO/RH(D): B POS

## 2013-05-05 SURGERY — Surgical Case
Anesthesia: Regional

## 2013-05-05 SURGERY — Surgical Case
Anesthesia: Spinal | Site: Abdomen | Laterality: Bilateral | Wound class: Clean Contaminated

## 2013-05-05 MED ORDER — OXYTOCIN 40 UNITS IN LACTATED RINGERS INFUSION - SIMPLE MED: 62.5000 mL/h | INTRAVENOUS | Status: DC

## 2013-05-05 MED ORDER — LABETALOL HCL 5 MG/ML IV SOLN
10.0000 mg | Freq: Once | INTRAVENOUS | Status: AC
  Administered 2013-05-05: 10 mg via INTRAVENOUS

## 2013-05-05 MED ORDER — NALBUPHINE SYRINGE 5 MG/0.5 ML
INJECTION | INTRAMUSCULAR | Status: AC
  Filled 2013-05-05: qty 1

## 2013-05-05 MED ORDER — KETOROLAC TROMETHAMINE 60 MG/2ML IM SOLN
INTRAMUSCULAR | Status: AC
  Filled 2013-05-05: qty 2

## 2013-05-05 MED ORDER — OXYCODONE-ACETAMINOPHEN 5-325 MG PO TABS: 1.0000 | ORAL_TABLET | ORAL | Status: DC | PRN

## 2013-05-05 MED ORDER — KETOROLAC TROMETHAMINE 30 MG/ML IJ SOLN: 30.0000 mg | Freq: Four times a day (QID) | INTRAMUSCULAR | Status: DC | PRN

## 2013-05-05 MED ORDER — CITRIC ACID-SODIUM CITRATE 334-500 MG/5ML PO SOLN
30.0000 mL | ORAL | Status: DC | PRN
  Administered 2013-05-05: 30 mL via ORAL
  Filled 2013-05-05: qty 15

## 2013-05-05 MED ORDER — OXYTOCIN 40 UNITS IN LACTATED RINGERS INFUSION - SIMPLE MED
INTRAVENOUS | Status: DC | PRN
  Administered 2013-05-05: 40 [IU] via INTRAVENOUS

## 2013-05-05 MED ORDER — FENTANYL CITRATE 0.05 MG/ML IJ SOLN
25.0000 ug | INTRAMUSCULAR | Status: DC | PRN
  Administered 2013-05-05: 100 ug via INTRAVENOUS

## 2013-05-05 MED ORDER — CEFAZOLIN SODIUM-DEXTROSE 2-3 GM-% IV SOLR
INTRAVENOUS | Status: AC
  Filled 2013-05-05: qty 50

## 2013-05-05 MED ORDER — LACTATED RINGERS IV SOLN
INTRAVENOUS | Status: DC
  Administered 2013-05-06 (×2): via INTRAVENOUS

## 2013-05-05 MED ORDER — LABETALOL HCL 200 MG PO TABS
400.0000 mg | ORAL_TABLET | Freq: Three times a day (TID) | ORAL | Status: DC
  Administered 2013-05-05 – 2013-05-07 (×4): 400 mg via ORAL
  Filled 2013-05-05 (×6): qty 2

## 2013-05-05 MED ORDER — WITCH HAZEL-GLYCERIN EX PADS: 1.0000 "application " | MEDICATED_PAD | CUTANEOUS | Status: DC | PRN

## 2013-05-05 MED ORDER — MEPERIDINE HCL 25 MG/ML IJ SOLN: 6.2500 mg | INTRAMUSCULAR | Status: DC | PRN

## 2013-05-05 MED ORDER — ONDANSETRON HCL 4 MG/2ML IJ SOLN: 4.0000 mg | Freq: Four times a day (QID) | INTRAMUSCULAR | Status: DC | PRN

## 2013-05-05 MED ORDER — DIBUCAINE 1 % RE OINT: 1.0000 "application " | TOPICAL_OINTMENT | RECTAL | Status: DC | PRN

## 2013-05-05 MED ORDER — TETANUS-DIPHTH-ACELL PERTUSSIS 5-2.5-18.5 LF-MCG/0.5 IM SUSP
0.5000 mL | Freq: Once | INTRAMUSCULAR | Status: AC
  Administered 2013-05-06: 0.5 mL via INTRAMUSCULAR
  Filled 2013-05-05 (×3): qty 0.5

## 2013-05-05 MED ORDER — LACTATED RINGERS IV SOLN
INTRAVENOUS | Status: DC | PRN
  Administered 2013-05-05: 18:00:00 via INTRAVENOUS

## 2013-05-05 MED ORDER — FENTANYL CITRATE 0.05 MG/ML IJ SOLN
INTRAMUSCULAR | Status: DC | PRN
  Administered 2013-05-05: 75 ug via INTRAVENOUS

## 2013-05-05 MED ORDER — MAGNESIUM SULFATE BOLUS VIA INFUSION
4.0000 g | Freq: Once | INTRAVENOUS | Status: AC
  Administered 2013-05-05: 4 g via INTRAVENOUS
  Filled 2013-05-05: qty 500

## 2013-05-05 MED ORDER — ZOLPIDEM TARTRATE 5 MG PO TABS: 5.0000 mg | ORAL_TABLET | Freq: Every evening | ORAL | Status: DC | PRN

## 2013-05-05 MED ORDER — LACTATED RINGERS IV SOLN: 500.0000 mL | INTRAVENOUS | Status: DC | PRN

## 2013-05-05 MED ORDER — PRENATAL MULTIVITAMIN CH
1.0000 | ORAL_TABLET | Freq: Every day | ORAL | Status: DC
  Administered 2013-05-06 – 2013-05-08 (×3): 1 via ORAL
  Filled 2013-05-05 (×3): qty 1

## 2013-05-05 MED ORDER — BUPIVACAINE HCL (PF) 0.25 % IJ SOLN
INTRAMUSCULAR | Status: DC | PRN
  Administered 2013-05-05: 10 mL

## 2013-05-05 MED ORDER — ONDANSETRON HCL 4 MG/2ML IJ SOLN: 4.0000 mg | INTRAMUSCULAR | Status: DC | PRN

## 2013-05-05 MED ORDER — ACETAMINOPHEN 325 MG PO TABS: 650.0000 mg | ORAL_TABLET | ORAL | Status: DC | PRN

## 2013-05-05 MED ORDER — ONDANSETRON HCL 4 MG/2ML IJ SOLN
INTRAMUSCULAR | Status: AC
  Filled 2013-05-05: qty 2

## 2013-05-05 MED ORDER — DIPHENHYDRAMINE HCL 25 MG PO CAPS: 25.0000 mg | ORAL_CAPSULE | Freq: Four times a day (QID) | ORAL | Status: DC | PRN

## 2013-05-05 MED ORDER — FENTANYL CITRATE 0.05 MG/ML IJ SOLN: 25.0000 ug | INTRAMUSCULAR | Status: DC | PRN

## 2013-05-05 MED ORDER — MENTHOL 3 MG MT LOZG: 1.0000 | LOZENGE | OROMUCOSAL | Status: DC | PRN

## 2013-05-05 MED ORDER — LANOLIN HYDROUS EX OINT: 1.0000 "application " | TOPICAL_OINTMENT | CUTANEOUS | Status: DC | PRN

## 2013-05-05 MED ORDER — LABETALOL HCL 5 MG/ML IV SOLN
40.0000 mg | Freq: Once | INTRAVENOUS | Status: AC
  Administered 2013-05-05: 40 mg via INTRAVENOUS

## 2013-05-05 MED ORDER — OXYCODONE-ACETAMINOPHEN 5-325 MG PO TABS
1.0000 | ORAL_TABLET | ORAL | Status: DC | PRN
  Administered 2013-05-07: 2 via ORAL
  Administered 2013-05-08: 1 via ORAL
  Filled 2013-05-05: qty 2
  Filled 2013-05-05: qty 1

## 2013-05-05 MED ORDER — MORPHINE SULFATE 0.5 MG/ML IJ SOLN
INTRAMUSCULAR | Status: AC
  Filled 2013-05-05: qty 10

## 2013-05-05 MED ORDER — LABETALOL HCL 5 MG/ML IV SOLN
INTRAVENOUS | Status: AC
  Filled 2013-05-05: qty 4

## 2013-05-05 MED ORDER — NALBUPHINE HCL 10 MG/ML IJ SOLN: 5.0000 mg | INTRAMUSCULAR | Status: DC | PRN

## 2013-05-05 MED ORDER — ERYTHROMYCIN 5 MG/GM OP OINT
TOPICAL_OINTMENT | OPHTHALMIC | Status: AC
  Filled 2013-05-05: qty 1

## 2013-05-05 MED ORDER — IBUPROFEN 600 MG PO TABS: 600.0000 mg | ORAL_TABLET | Freq: Four times a day (QID) | ORAL | Status: DC | PRN

## 2013-05-05 MED ORDER — FENTANYL CITRATE 0.05 MG/ML IJ SOLN
INTRAMUSCULAR | Status: AC
  Filled 2013-05-05: qty 2

## 2013-05-05 MED ORDER — ONDANSETRON HCL 4 MG/2ML IJ SOLN
INTRAMUSCULAR | Status: DC | PRN
  Administered 2013-05-05: 4 mg via INTRAVENOUS

## 2013-05-05 MED ORDER — CEFAZOLIN SODIUM-DEXTROSE 2-3 GM-% IV SOLR
2.0000 g | Freq: Once | INTRAVENOUS | Status: AC
  Administered 2013-05-05: 2 g via INTRAVENOUS
  Filled 2013-05-05: qty 50

## 2013-05-05 MED ORDER — ONDANSETRON HCL 4 MG PO TABS: 4.0000 mg | ORAL_TABLET | ORAL | Status: DC | PRN

## 2013-05-05 MED ORDER — FENTANYL CITRATE 0.05 MG/ML IJ SOLN
INTRAMUSCULAR | Status: AC
  Administered 2013-05-05: 100 ug via INTRAVENOUS
  Filled 2013-05-05: qty 2

## 2013-05-05 MED ORDER — SIMETHICONE 80 MG PO CHEW: 80.0000 mg | CHEWABLE_TABLET | ORAL | Status: DC | PRN

## 2013-05-05 MED ORDER — LABETALOL HCL 5 MG/ML IV SOLN
20.0000 mg | Freq: Once | INTRAVENOUS | Status: AC
  Administered 2013-05-05: 20 mg via INTRAVENOUS

## 2013-05-05 MED ORDER — LACTATED RINGERS IV SOLN
INTRAVENOUS | Status: DC | PRN
  Administered 2013-05-05: 19:00:00 via INTRAVENOUS

## 2013-05-05 MED ORDER — LABETALOL HCL 5 MG/ML IV SOLN
INTRAVENOUS | Status: AC
  Administered 2013-05-05: 40 mg via INTRAVENOUS
  Filled 2013-05-05: qty 8

## 2013-05-05 MED ORDER — LACTATED RINGERS IV SOLN: INTRAVENOUS | Status: DC

## 2013-05-05 MED ORDER — OXYTOCIN 40 UNITS IN LACTATED RINGERS INFUSION - SIMPLE MED: 62.5000 mL/h | INTRAVENOUS | Status: AC

## 2013-05-05 MED ORDER — PHENYLEPHRINE HCL 10 MG/ML IJ SOLN
INTRAMUSCULAR | Status: DC | PRN
  Administered 2013-05-05 (×3): 40 ug via INTRAVENOUS

## 2013-05-05 MED ORDER — NALBUPHINE HCL 10 MG/ML IJ SOLN
5.0000 mg | INTRAMUSCULAR | Status: DC | PRN
  Administered 2013-05-05: 10 mg via INTRAVENOUS

## 2013-05-05 MED ORDER — METOCLOPRAMIDE HCL 5 MG/ML IJ SOLN: 10.0000 mg | Freq: Once | INTRAMUSCULAR | Status: DC | PRN

## 2013-05-05 MED ORDER — OXYTOCIN 10 UNIT/ML IJ SOLN
INTRAMUSCULAR | Status: AC
  Filled 2013-05-05: qty 4

## 2013-05-05 MED ORDER — PHENYLEPHRINE 40 MCG/ML (10ML) SYRINGE FOR IV PUSH (FOR BLOOD PRESSURE SUPPORT)
PREFILLED_SYRINGE | INTRAVENOUS | Status: AC
  Filled 2013-05-05: qty 5

## 2013-05-05 MED ORDER — LIDOCAINE HCL (PF) 1 % IJ SOLN: 30.0000 mL | INTRAMUSCULAR | Status: DC | PRN

## 2013-05-05 MED ORDER — EPHEDRINE 5 MG/ML INJ
INTRAVENOUS | Status: AC
  Filled 2013-05-05: qty 10

## 2013-05-05 MED ORDER — POTASSIUM CHLORIDE CRYS ER 20 MEQ PO TBCR
20.0000 meq | EXTENDED_RELEASE_TABLET | Freq: Three times a day (TID) | ORAL | Status: DC
  Administered 2013-05-07: 20 meq via ORAL
  Filled 2013-05-05 (×5): qty 1

## 2013-05-05 MED ORDER — OXYTOCIN BOLUS FROM INFUSION: 500.0000 mL | INTRAVENOUS | Status: DC

## 2013-05-05 MED ORDER — IBUPROFEN 600 MG PO TABS
600.0000 mg | ORAL_TABLET | Freq: Four times a day (QID) | ORAL | Status: DC
  Administered 2013-05-06 – 2013-05-07 (×6): 600 mg via ORAL
  Filled 2013-05-05 (×6): qty 1

## 2013-05-05 MED ORDER — FENTANYL CITRATE 0.05 MG/ML IJ SOLN
INTRAMUSCULAR | Status: DC | PRN
  Administered 2013-05-05: 25 ug via INTRATHECAL

## 2013-05-05 MED ORDER — SCOPOLAMINE 1 MG/3DAYS TD PT72
1.0000 | MEDICATED_PATCH | Freq: Once | TRANSDERMAL | Status: DC
  Administered 2013-05-05: 1.5 mg via TRANSDERMAL

## 2013-05-05 MED ORDER — KETOROLAC TROMETHAMINE 60 MG/2ML IM SOLN: 60.0000 mg | Freq: Once | INTRAMUSCULAR | Status: DC | PRN

## 2013-05-05 MED ORDER — FLEET ENEMA 7-19 GM/118ML RE ENEM: 1.0000 | ENEMA | RECTAL | Status: DC | PRN

## 2013-05-05 MED ORDER — SENNOSIDES-DOCUSATE SODIUM 8.6-50 MG PO TABS
2.0000 | ORAL_TABLET | Freq: Every day | ORAL | Status: DC
  Administered 2013-05-05 – 2013-05-07 (×3): 2 via ORAL

## 2013-05-05 MED ORDER — CEFAZOLIN SODIUM-DEXTROSE 2-3 GM-% IV SOLR: 2.0000 g | INTRAVENOUS | Status: DC

## 2013-05-05 MED ORDER — SCOPOLAMINE 1 MG/3DAYS TD PT72
MEDICATED_PATCH | TRANSDERMAL | Status: AC
  Administered 2013-05-05: 1.5 mg via TRANSDERMAL
  Filled 2013-05-05: qty 1

## 2013-05-05 MED ORDER — SIMETHICONE 80 MG PO CHEW
80.0000 mg | CHEWABLE_TABLET | Freq: Three times a day (TID) | ORAL | Status: DC
  Administered 2013-05-06 – 2013-05-08 (×10): 80 mg via ORAL

## 2013-05-05 MED ORDER — LACTATED RINGERS IV SOLN
INTRAVENOUS | Status: DC
  Administered 2013-05-05: 14:00:00 via INTRAVENOUS

## 2013-05-05 MED ORDER — MORPHINE SULFATE (PF) 0.5 MG/ML IJ SOLN
INTRAMUSCULAR | Status: DC | PRN
  Administered 2013-05-05: .15 mg via INTRATHECAL

## 2013-05-05 MED ORDER — MORPHINE SULFATE (PF) 0.5 MG/ML IJ SOLN
INTRAMUSCULAR | Status: DC | PRN
  Administered 2013-05-05: 4.85 mg via INTRAVENOUS

## 2013-05-05 MED ORDER — MAGNESIUM SULFATE 40 G IN LACTATED RINGERS - SIMPLE
2.0000 g/h | INTRAVENOUS | Status: DC
  Administered 2013-05-06: 2 g/h via INTRAVENOUS
  Filled 2013-05-05 (×2): qty 500

## 2013-05-05 SURGICAL SUPPLY — 43 items
BENZOIN TINCTURE PRP APPL 2/3 (GAUZE/BANDAGES/DRESSINGS) IMPLANT
BLADE EXTENDED COATED 6.5IN (ELECTRODE) IMPLANT
BLADE HEX COATED 2.75 (ELECTRODE) IMPLANT
BOOTIES KNEE HIGH SLOAN (MISCELLANEOUS) ×4 IMPLANT
CLAMP CORD UMBIL (MISCELLANEOUS) IMPLANT
CLOTH BEACON ORANGE TIMEOUT ST (SAFETY) ×2 IMPLANT
CONTAINER PREFILL 10% NBF 15ML (MISCELLANEOUS) ×4 IMPLANT
DERMABOND ADVANCED (GAUZE/BANDAGES/DRESSINGS)
DERMABOND ADVANCED .7 DNX12 (GAUZE/BANDAGES/DRESSINGS) IMPLANT
DRAIN JACKSON PRT FLT 7MM (DRAIN) IMPLANT
DRAPE LG THREE QUARTER DISP (DRAPES) ×2 IMPLANT
DRSG OPSITE POSTOP 4X10 (GAUZE/BANDAGES/DRESSINGS) ×2 IMPLANT
DURAPREP 26ML APPLICATOR (WOUND CARE) ×2 IMPLANT
ELECT REM PT RETURN 9FT ADLT (ELECTROSURGICAL) ×2
ELECTRODE REM PT RTRN 9FT ADLT (ELECTROSURGICAL) ×1 IMPLANT
EVACUATOR SILICONE 100CC (DRAIN) IMPLANT
EXTRACTOR VACUUM KIWI (MISCELLANEOUS) IMPLANT
EXTRACTOR VACUUM M CUP 4 TUBE (SUCTIONS) IMPLANT
GLOVE SURG SS PI 6.5 STRL IVOR (GLOVE) ×4 IMPLANT
GOWN STRL REIN XL XLG (GOWN DISPOSABLE) ×4 IMPLANT
KIT ABG SYR 3ML LUER SLIP (SYRINGE) IMPLANT
NEEDLE HYPO 25X5/8 SAFETYGLIDE (NEEDLE) ×2 IMPLANT
NEEDLE SPNL 22GX3.5 QUINCKE BK (NEEDLE) ×2 IMPLANT
NS IRRIG 1000ML POUR BTL (IV SOLUTION) ×2 IMPLANT
PACK C SECTION WH (CUSTOM PROCEDURE TRAY) ×2 IMPLANT
PAD OB MATERNITY 4.3X12.25 (PERSONAL CARE ITEMS) ×2 IMPLANT
STRIP CLOSURE SKIN 1/4X4 (GAUZE/BANDAGES/DRESSINGS) IMPLANT
SUT CHROMIC 2 0 SH (SUTURE) ×2 IMPLANT
SUT MNCRL AB 3-0 PS2 27 (SUTURE) ×2 IMPLANT
SUT SILK 0 FSL (SUTURE) IMPLANT
SUT VIC AB 0 CT1 27 (SUTURE) ×2
SUT VIC AB 0 CT1 27XBRD ANBCTR (SUTURE) ×2 IMPLANT
SUT VIC AB 0 CT1 36 (SUTURE) IMPLANT
SUT VIC AB 0 CTX 36 (SUTURE)
SUT VIC AB 0 CTX36XBRD ANBCTRL (SUTURE) IMPLANT
SUT VIC AB 2-0 CT1 27 (SUTURE) ×2
SUT VIC AB 2-0 CT1 TAPERPNT 27 (SUTURE) ×2 IMPLANT
SUT VIC AB 2-0 SH 27 (SUTURE)
SUT VIC AB 2-0 SH 27XBRD (SUTURE) IMPLANT
SYR CONTROL 10ML LL (SYRINGE) ×2 IMPLANT
TOWEL OR 17X24 6PK STRL BLUE (TOWEL DISPOSABLE) ×2 IMPLANT
TRAY FOLEY CATH 14FR (SET/KITS/TRAYS/PACK) ×2 IMPLANT
WATER STERILE IRR 1000ML POUR (IV SOLUTION) ×2 IMPLANT

## 2013-05-05 NOTE — Anesthesia Procedure Notes (Signed)

## 2013-05-05 NOTE — Consult Note (Signed)
Maternal Fetal Medicine Consultation  Requesting Provider(s): Silverio Lay, MD  Reason for consultation: Chronic hypertension - recommendations on timing of delivery  HPI: Krista [name] is a 32 yo [id number] currently at 37 2/7 weeks seen to discuss recommendations on timing of delivery due to hypertension resistant to maximum doses of Labetalol.  Krista [name] has a history of Hyperaldosteronism- currently followed by Dr. Harrel Carina.  Outside of pregnancy, she is treated with Spironolactone which was discontinued due to potential risks of virilization.  She is currently on Inspra 50 mg BID.  Over the last month, her blood pressures has progressively worsened.  In mid August, she was started on Labetalol 200 mg BID that has progressively been titrated upward to 800 mg TID currently. Her last preeclampsia labs on 8/19 were within normal limits.  Her Pro/Creat ratio was 0.2.  She is without complaints today.  The fetus is active.  She denies HA, visual changes or RUQ pain.  She reports that she indeed took her blood pressure medications today.  She reports a similar course in her previous pregnancies.  Her past OB history is remarkable for a primary C-section in 2005 for twins (malpresentation) and a subsequent elective repeat at 37 weeks (due to hypertension).  OB History: OB History   Grav Para Term Preterm Abortions TAB SAB Ect Mult Living   7 2 2  0 4 1 3  1 3       PMH:  Past Medical History  Diagnosis Date  . Hypokalemia 2007    IP Sept 2012  . Hypertension   . Primary aldosteronism   . Scoliosis   . Aldosteronism 2012  . H/O thalassemia   . H/O varicella   . H/O candidiasis   . Hx: UTI (urinary tract infection) 2010  . Sickle cell trait   . Yeast infection   . H/O bacterial infection   . Dermatitis 2007  . BV (bacterial vaginosis) 2008  . Monilial vaginitis 2008  . Yeast vaginitis 2010  . CIN I (cervical intraepithelial neoplasia I) 2010  . Postpartum hypertension 07/06/10  .  Pregnancy induced hypertension 2011  . Abnormal Pap smear 2010    Colpo & LEEP; LAST PAP 09/2011  . Infection     UTI X 1  . Thalassemia minor     CHRONIC  . Migraine headache     OTC  . Migraine     PSH:  Past Surgical History  Procedure Laterality Date  . Breast reduction surgery  Jan 2006  . Cesarean section  2005 2011  . Dilation and curettage of uterus  2003  . Wisdom tooth extraction     Meds: Inspra 50 mg BID             Labetalol 800 mg TID             Potassium supplements             PNV             Iron supplements  Allergies:  Allergies  Allergen Reactions  . Sulfa Antibiotics Other (See Comments)    Eyes turn blood shot red   FH: Mother - Thalassemia, Guillain Barre syndrome  Father - HTN, MI, Kidney disease  Maternal aunt, maternal grandmother - Breast CA Denies birth defects  Soc: denies tobacco, ETOH use  Review of Systems: no vaginal bleeding or cramping/contractions, no LOF, no nausea/vomiting. All other systems reviewed and are negative.  PE:   Filed Vitals:  05/05/13 1305  BP: 199/114  Pulse: 95    GEN: well-appearing female ABD: gravid, NT  Please see separate document for fetal ultrasound report.  Labs: CBC    Component Value Date/Time   WBC 9.2 04/18/2013 1214   RBC 4.12 04/18/2013 1214   HGB 8.4* 04/18/2013 1214   HCT 26.7* 04/18/2013 1214   PLT 243 04/18/2013 1214   MCV 64.8* 04/18/2013 1214   MCH 20.4* 04/18/2013 1214   MCHC 31.5 04/18/2013 1214   RDW 16.0* 04/18/2013 1214   LYMPHSABS 1.7 10/23/2012 1352   MONOABS 0.4 10/23/2012 1352   EOSABS 0.1 10/23/2012 1352   BASOSABS 0.0 10/23/2012 1352   CMP     Component Value Date/Time   NA 137 04/18/2013 1214   K 2.8* 04/18/2013 1214   CL 102 04/18/2013 1214   CO2 23 04/18/2013 1214   GLUCOSE 107* 04/18/2013 1214   BUN 5* 04/18/2013 1214   CREATININE 0.60 04/18/2013 1214   CREATININE 0.59 10/23/2012 1351   CREATININE 0.64 05/21/2010 2257   CALCIUM 8.7 04/18/2013 1214   PROT 6.1 04/18/2013  1214   ALBUMIN 2.2* 04/18/2013 1214   AST 13 04/18/2013 1214   ALT 7 04/18/2013 1214   ALKPHOS 174* 04/18/2013 1214   BILITOT 0.2* 04/18/2013 1214   GFRNONAA >90 04/18/2013 1214   GFRAA >90 04/18/2013 1214     A/P: 1) Single IUP at 37 2/7 weeks         2) CHTN, ? Gestational hypertension - based on elevated BPs, patient was sent to MAU for further evaluation.  Based on the most recent ACOG task force on Hypertension in Pregnancy guidelines, there is very little distinction between gestational hypertension ("PIH") and mild preeclampsia as proteinuria is no longer a requirement for the diagnosis.  If the patient is indeed compliant with her medications, would recommend delivery as soon as feasible.  There is no need to wait until [redacted] weeks gestation; delivery at 37 weeks is appropriate.  If the patient has proteinuria or any other severe features (elevated LFTs, thrombocytopenia), would recommend Magnesium sulfate prophylaxis.         3) Hyperaldosteronism - continue Inspra as written, potassium supplements as needed  Recommendations were discussed with Dr. Pennie Rushing.  Thank you for the opportunity to be a part of the care of [facility]. Please contact our office if we can be of further assistance.   I spent approximately 30 minutes with this patient with over 50% of time spent in face-to-face counseling.  Alpha Gula, MD Maternal Fetal Medicine

## 2013-05-05 NOTE — Anesthesia Postprocedure Evaluation (Signed)
  Anesthesia Post-op Note  Anesthesia Post Note  Patient: Krista [name]  Procedure(s) Performed: Procedure(s) (LRB): REPEAT CESAREAN SECTION WITH BILATERAL TUBAL LIGATION, (Bilateral)  Anesthesia type: Spinal  Patient location: PACU  Post pain: Pain level controlled  Post assessment: Post-op Vital signs reviewed  Last Vitals:  Filed Vitals:   05/05/13 2045  BP: 185/107  Pulse: 79  Temp:   Resp: 19    Post vital signs: Reviewed  Level of consciousness: awake  Complications: No apparent anesthesia complications

## 2013-05-05 NOTE — Op Note (Signed)
Cesarean Section WITH Bilateral Tubal Sterilization Procedure Note  Indications: previous uterine incision kerr x2  Pre-operative Diagnosis: 37 week 5 day pregnancy.                                                Chronic hypertension, poor control                                                 Cannot r/o pre eclampsia  Post-operative Diagnosis: same  Surgeon: Hal Morales  First Assistant:  Surgeon: Hal Morales   Assistants: NONE  Anesthesia: Spinal anesthesia  ASA Class: 2  Procedure Details  The patient was seen in the Holding Room. The risks, benefits, complications, treatment options, and expected outcomes were discussed with the patient.  The patient concurred with the proposed plan, giving informed consent.  The site of surgery properly noted/marked. The patient was taken to Operating Room # 2, identified as Krista [name] and the procedure verified as C-Section Delivery and Tubal sterilization. A Time Out was held and the above information confirmed.  After induction of anesthesia, the patient was  prepped withChloraprep in the usual sterile manner.A foley catheter was placed under sterile conditions.  The patient was then draped in the usual fashion.   Suprapubicsubcutaneous injection of 0.25% Bupivacaine   A Pfannenstiel incision was made and carried down through the subcutaneous tissue to the fascia. Fascial incision was made and extended transversely. The fascia was separated from the underlying rectus tissue superiorly and inferiorly. The peritoneum was identified and entered. Peritoneal incision was extended longitudinally.  An Alexis retractor was placed.   A low transverse uterine incision was made two cm above the uterovesical fold after the bladder serosa was incised and a bladder flap developed.That incision was extended transversely bluntly.The infant was  delivered from occiput transverse presentation with aid of a Kiwi vacuum extractor was a living female  infant Apgar scores of 8 at one minute and 9 at five minutes. After the umbilical cord was clamped and cut cord blood was obtained for evaluation. The placenta was removed intact and appeared normal. The uterine outline, tubes and ovaries appeared norma for the gravid state. The uterine incision was closed with running locked sutures of 0 Vicryl. An imbricating layer of sutures was placed. Hemostasis was observed. Lavage was carried out until clear.  The left fallopian tube was elevated and excised from the mesosalpinx in four successive clamp, cut and suture ligation sequences, with the distal end of the fallopian tube being removed from the operative field, and the cut end being cauterized to achieve hemostasis.  A similar procedure was performed on the opposite side. The suture used for this procedure was 2-0 chromic.  The Alexis retractor was removed The peritoneum was closed with a running suture of 2-0 Vicryl.  The rectus muscles were reapproximated with a figure of 8 suture of 2-0 Vicryl.  The fascia was then reapproximated with a running sutures of 0 Vicryl .Renforcing figure of 8 sutures of 0Vicryl were placed on either side of midline.   The skin was reapproximated with }3-0 moncryl.  A sterile dressing was applied.  Instrument, sponge, and needle counts were correct prior to the abdominal closure  and at the conclusion of the case.   Findings:  Placenta contained a 3vessel cord    Estimated Blood Loss:  750cc         Drains: none         Total IV Fluids:         Specimens: placenta to birthing suites.  Distal portions of right and left fallopian tubes to pathology         Implants: none         Complications: ::"None; patient tolerated the procedure well."         Disposition: PACU - hemodynamically stable.         Condition: stable  Attending Attestation: I performed the procedure.  HAYGOOD,VANESSA P  05/05/2013 10:07 PM

## 2013-05-05 NOTE — Transfer of Care (Signed)
Immediate Anesthesia Transfer of Care Note  Patient: Krista [name]  Procedure(s) Performed: Procedure(s): REPEAT CESAREAN SECTION WITH BILATERAL TUBAL LIGATION, (Bilateral)  Patient Location: PACU  Anesthesia Type:Spinal  Level of Consciousness: awake  Airway & Oxygen Therapy: Patient Spontanous Breathing  Post-op Assessment: Report given to PACU RN and Post -op Vital signs reviewed and stable  Post vital signs: stable  Complications: No apparent anesthesia complications

## 2013-05-05 NOTE — Anesthesia Preprocedure Evaluation (Addendum)
Anesthesia Evaluation  Patient identified by MRN, date of birth, ID band Patient awake    Reviewed: Allergy & Precautions, H&P , Patient's Chart, lab work & pertinent test results  Airway Mallampati: III TM Distance: >3 FB Neck ROM: full    Dental no notable dental hx.    Pulmonary  breath sounds clear to auscultation  Pulmonary exam normal       Cardiovascular Exercise Tolerance: Good hypertension, Rhythm:regular Rate:Normal     Neuro/Psych    GI/Hepatic   Endo/Other  Morbid obesity  Renal/GU      Musculoskeletal   Abdominal   Peds  Hematology  (+) anemia ,   Anesthesia Other Findings    Hypokalemia   Hypertension       Primary aldosteronism      Scoliosis         Reproductive/Obstetrics                           Anesthesia Physical Anesthesia Plan  ASA: III  Anesthesia Plan: Spinal   Post-op Pain Management:    Induction:   Airway Management Planned:   Additional Equipment:   Intra-op Plan:   Post-operative Plan:   Informed Consent: I have reviewed the patients History and Physical, chart, labs and discussed the procedure including the risks, benefits and alternatives for the proposed anesthesia with the patient or authorized representative who has indicated his/her understanding and acceptance.   Dental Advisory Given  Plan Discussed with: CRNA  Anesthesia Plan Comments: (Lab work confirmed with CRNA in room. Platelets okay. Discussed spinal anesthetic, and patient consents to the procedure:  included risk of possible headache,backache, failed block, allergic reaction, and nerve injury. This patient was asked if she had any questions or concerns before the procedure started. )        Anesthesia Quick Evaluation

## 2013-05-05 NOTE — H&P (Signed)
Krista [name] is a 32 y.o. female at 83 w 2 d presenting for repeat c/s and btl due to uncontrolled BP in the  Face of chronic hypertension and PIH vs pre-eclampsia.Pt had visit with MFM today for advice on timing of delivery and the opinion was that she should be delivered at this point.  She has decided she wants no further children and requests BTL which she has consent for a number of weeks ago. History OB History   Grav Para Term Preterm Abortions TAB SAB Ect Mult Living   7 2 2  0 4 1 3  1 3      Past Medical History  Diagnosis Date  . Hypokalemia 2007    IP Sept 2012  . Hypertension   . Primary aldosteronism   . Scoliosis   . Aldosteronism 2012  . H/O thalassemia   . H/O varicella   . H/O candidiasis   . Hx: UTI (urinary tract infection) 2010  . Sickle cell trait   . Yeast infection   . H/O bacterial infection   . Dermatitis 2007  . BV (bacterial vaginosis) 2008  . Monilial vaginitis 2008  . Yeast vaginitis 2010  . CIN I (cervical intraepithelial neoplasia I) 2010  . Postpartum hypertension 07/06/10  . Pregnancy induced hypertension 2011  . Abnormal Pap smear 2010    Colpo & LEEP; LAST PAP 09/2011  . Infection     UTI X 1  . Thalassemia minor     CHRONIC  . Migraine headache     OTC  . Migraine    Past Surgical History  Procedure Laterality Date  . Breast reduction surgery  Jan 2006  . Cesarean section  2005 2011  . Dilation and curettage of uterus  2003  . Wisdom tooth extraction     Family History: family history includes Arthritis in her mother; Breast cancer in her maternal aunt and maternal grandmother; Cancer in her maternal grandfather; Early death in her father; Early death (age of onset: 57) in her mother; Heart attack in her father; Heart disease in her father; Hypertension in her father; Kidney disease in her father; Other in her mother; Thalassemia in her mother. Social History:  reports that she has never smoked. She has never used smokeless tobacco.  She reports that she does not drink alcohol or use illicit drugs.   Prenatal Transfer Tool  Maternal Diabetes: No Genetic Screening: Declined Maternal Ultrasounds/Referrals: Normal Fetal Ultrasounds or other Referrals:  Other: BPPs Maternal Substance Abuse:  No Significant Maternal Medications:  Meds include: Other: Labetalol Significant Maternal Lab Results:  Lab values include: Group B Strep negative Other Comments:  Chronic hypokalemia, anemia  Review of Systems  Constitutional: Negative.   HENT: Negative.   Eyes: Negative.   Respiratory: Negative.   Cardiovascular: Negative.   Gastrointestinal: Negative.   Genitourinary: Negative.   Musculoskeletal: Negative.   Skin: Negative.   Neurological: Negative.   Endo/Heme/Allergies: Negative.   Psychiatric/Behavioral: Negative.       Blood pressure 177/89, pulse 84, temperature 97.6 F (36.4 C), temperature source Axillary, resp. rate 20, height 5\' 7"  (1.702 m), weight 226 lb (102.513 kg), last menstrual period 08/17/2012. Maternal Exam:  Abdomen: Surgical scars: low transverse.   Fundal height is 36.       Physical Exam  Constitutional: She is oriented to person, place, and time. She appears well-developed and well-nourished.  HENT:  Head: Normocephalic and atraumatic.  Eyes: EOM are normal.  Neck: Normal range of motion.  Neck supple.  Cardiovascular: Normal rate and regular rhythm.   Respiratory: Effort normal.  GI: Soft.  Musculoskeletal: Normal range of motion.  Neurological: She is alert and oriented to person, place, and time.  Skin: Skin is warm and dry.  Psychiatric: She has a normal mood and affect.    Prenatal labs: ABO, Rh: --/--/B POS (09/02 1424) Antibody: NEG (09/02 1424) Rubella: 0.91 (02/20 1352) RPR: NON REAC (02/20 1352)  HBsAg: NEGATIVE (02/20 1352)  HIV: NON REACTIVE (02/20 1352)  GBS:   neg  Assessment/Plan: IUP at [redacted]w[redacted]d Chronic hypertension, uncontrolled on maximum labetalol Cannot r/o  pre eclampsia   Krista [name] P 05/05/2013, 5:34 PM

## 2013-05-06 ENCOUNTER — Ambulatory Visit (HOSPITAL_COMMUNITY): Payer: BC Managed Care – PPO

## 2013-05-06 ENCOUNTER — Encounter (HOSPITAL_COMMUNITY): Payer: Self-pay | Admitting: Obstetrics and Gynecology

## 2013-05-06 LAB — COMPREHENSIVE METABOLIC PANEL
ALT: 6 U/L (ref 0–35)
AST: 18 U/L (ref 0–37)
Calcium: 8.2 mg/dL — ABNORMAL LOW (ref 8.4–10.5)
Sodium: 131 mEq/L — ABNORMAL LOW (ref 135–145)
Total Protein: 6 g/dL (ref 6.0–8.3)

## 2013-05-06 LAB — CBC WITH DIFFERENTIAL/PLATELET
Basophils Absolute: 0 10*3/uL (ref 0.0–0.1)
Eosinophils Absolute: 0 10*3/uL (ref 0.0–0.7)
Eosinophils Relative: 0 % (ref 0–5)
Lymphocytes Relative: 7 % — ABNORMAL LOW (ref 12–46)
MCH: 20.2 pg — ABNORMAL LOW (ref 26.0–34.0)
MCV: 64.5 fL — ABNORMAL LOW (ref 78.0–100.0)
Neutrophils Relative %: 88 % — ABNORMAL HIGH (ref 43–77)
Platelets: 236 10*3/uL (ref 150–400)
RDW: 16.6 % — ABNORMAL HIGH (ref 11.5–15.5)
WBC: 14.9 10*3/uL — ABNORMAL HIGH (ref 4.0–10.5)

## 2013-05-06 MED ORDER — LABETALOL HCL 200 MG PO TABS
200.0000 mg | ORAL_TABLET | Freq: Once | ORAL | Status: AC
  Administered 2013-05-06: 200 mg via ORAL
  Filled 2013-05-06: qty 1

## 2013-05-06 NOTE — Progress Notes (Signed)
Subjective: Postpartum Day 1: Cesarean Delivery Patient reports tolerating PO and + flatus.    Objective: Vital signs in last 24 hours: Temp:  [96.9 F (36.1 C)-98.4 F (36.9 C)] 96.9 F (36.1 C) (09/03 1204) Pulse Rate:  [75-95] 76 (09/03 1204) Resp:  [15-46] 18 (09/03 1204) BP: (127-212)/(71-114) 158/80 mmHg (09/03 1204) SpO2:  [96 %-100 %] 96 % (09/02 2115) Weight:  [226 lb (102.513 kg)] 226 lb (102.513 kg) (09/02 1407)  Physical Exam: CV RRR Lungs CTAB  General: alert Lochia: appropriate Uterine Fundus: firm Incision: healing well, no significant drainage DVT Evaluation: No evidence of DVT seen on physical exam. 2 plus DTR   Recent Labs  05/05/13 1424 05/06/13 0525  HGB 9.7* 8.2*  HCT 30.8* 26.2*   CMP     Component Value Date/Time   NA 131* 05/06/2013 0525   K 3.7 05/06/2013 0525   CL 96 05/06/2013 0525   CO2 20 05/06/2013 0525   GLUCOSE 149* 05/06/2013 0525   BUN 8 05/06/2013 0525   CREATININE 0.58 05/06/2013 0525   CREATININE 0.59 10/23/2012 1351   CREATININE 0.64 05/21/2010 2257   CALCIUM 8.2* 05/06/2013 0525   PROT 6.0 05/06/2013 0525   ALBUMIN 2.0* 05/06/2013 0525   AST 18 05/06/2013 0525   ALT 6 05/06/2013 0525   ALKPHOS 167* 05/06/2013 0525   BILITOT 0.2* 05/06/2013 0525   GFRNONAA >90 05/06/2013 0525   GFRAA >90 05/06/2013 0525    Assessment/Plan: Status post Cesarean section. Doing well postoperatively. Preeclampsia on Magnesium  Plan to dc magnesium this evening Pt with asequate urine out put and normal labs.  Deseree Zemaitis A 05/06/2013, 12:59 PM

## 2013-05-06 NOTE — Progress Notes (Signed)
Ur chart review completed.  

## 2013-05-06 NOTE — Progress Notes (Signed)
0930 Received call from infection control regarding patient need to be placed on contact precautions for a.bacter in urine.

## 2013-05-07 LAB — CBC WITH DIFFERENTIAL/PLATELET
Basophils Absolute: 0 10*3/uL (ref 0.0–0.1)
Eosinophils Absolute: 0.1 10*3/uL (ref 0.0–0.7)
HCT: 24 % — ABNORMAL LOW (ref 36.0–46.0)
Lymphs Abs: 1.8 10*3/uL (ref 0.7–4.0)
MCHC: 31.7 g/dL (ref 30.0–36.0)
MCV: 64.2 fL — ABNORMAL LOW (ref 78.0–100.0)
Monocytes Relative: 6 % (ref 3–12)
Neutro Abs: 11 10*3/uL — ABNORMAL HIGH (ref 1.7–7.7)
RDW: 16.7 % — ABNORMAL HIGH (ref 11.5–15.5)

## 2013-05-07 LAB — COMPREHENSIVE METABOLIC PANEL
Albumin: 1.8 g/dL — ABNORMAL LOW (ref 3.5–5.2)
BUN: 11 mg/dL (ref 6–23)
Chloride: 106 mEq/L (ref 96–112)
Creatinine, Ser: 0.73 mg/dL (ref 0.50–1.10)
GFR calc Af Amer: >90 mL/min (ref >90)
Total Bilirubin: 0.2 mg/dL — ABNORMAL LOW (ref 0.3–1.2)

## 2013-05-07 MED ORDER — IBUPROFEN 600 MG PO TABS
600.0000 mg | ORAL_TABLET | Freq: Four times a day (QID) | ORAL | Status: DC
  Administered 2013-05-07 – 2013-05-08 (×4): 600 mg via ORAL
  Filled 2013-05-07 (×4): qty 1

## 2013-05-07 MED ORDER — LABETALOL HCL 200 MG PO TABS
400.0000 mg | ORAL_TABLET | Freq: Three times a day (TID) | ORAL | Status: DC
  Administered 2013-05-07 (×2): 400 mg via ORAL
  Filled 2013-05-07 (×3): qty 2

## 2013-05-07 MED ORDER — SPIRONOLACTONE 100 MG PO TABS
200.0000 mg | ORAL_TABLET | Freq: Two times a day (BID) | ORAL | Status: DC
  Administered 2013-05-07 – 2013-05-08 (×2): 200 mg via ORAL
  Filled 2013-05-07 (×5): qty 2

## 2013-05-07 NOTE — Progress Notes (Signed)
Subjective: Postpartum Day 2: Cesarean Delivery Patient reports tolerating PO, + flatus and no problems voiding.  No headache , SOB or headaches  Objective: Vital signs in last 24 hours: Temp:  [96.9 F (36.1 C)-98.6 F (37 C)] 98.4 F (36.9 C) (09/04 1145) Pulse Rate:  [76-89] 89 (09/04 1145) Resp:  [18-20] 18 (09/04 1145) BP: (158-174)/(77-87) 165/78 mmHg (09/04 1145)  Physical Exam:  General: alert and cooperative Lochia: appropriate Uterine Fundus: firm Incision: no significant drainage DVT Evaluation: No evidence of DVT seen on physical exam.   Recent Labs  05/05/13 1424 05/06/13 0525  HGB 9.7* 8.2*  HCT 30.8* 26.2*    Assessment/Plan: Status post Cesarean section. Doing well postoperatively.  Restart spironalcotne. DC potassium because last level was normal Check labs today Transfer to the floor  Denzil Bristol A 05/07/2013, 11:52 AM

## 2013-05-07 NOTE — Progress Notes (Signed)
MD called regarding elevated BPs since transfer to unit. Orders received.

## 2013-05-08 DIAGNOSIS — Z302 Encounter for sterilization: Secondary | ICD-10-CM

## 2013-05-08 DIAGNOSIS — D649 Anemia, unspecified: Secondary | ICD-10-CM | POA: Diagnosis present

## 2013-05-08 DIAGNOSIS — A498 Other bacterial infections of unspecified site: Secondary | ICD-10-CM

## 2013-05-08 LAB — URINE CULTURE: Colony Count: >=100000

## 2013-05-08 MED ORDER — NIFEDIPINE ER 30 MG PO TB24
30.0000 mg | ORAL_TABLET | Freq: Every day | ORAL | Status: DC
  Administered 2013-05-08: 30 mg via ORAL
  Filled 2013-05-08 (×2): qty 1

## 2013-05-08 MED ORDER — OXYCODONE-ACETAMINOPHEN 5-325 MG PO TABS: 1.0000 | ORAL_TABLET | ORAL | Status: DC | PRN

## 2013-05-08 MED ORDER — CIPROFLOXACIN HCL 500 MG PO TABS: 500.0000 mg | ORAL_TABLET | Freq: Two times a day (BID) | ORAL | Status: AC

## 2013-05-08 MED ORDER — NIFEDIPINE ER 30 MG PO TB24: 30.0000 mg | ORAL_TABLET | Freq: Every day | ORAL | Status: DC

## 2013-05-08 MED ORDER — IBUPROFEN 600 MG PO TABS: 600.0000 mg | ORAL_TABLET | Freq: Four times a day (QID) | ORAL | Status: DC

## 2013-05-08 MED ORDER — SPIRONOLACTONE 100 MG PO TABS: 200.0000 mg | ORAL_TABLET | Freq: Two times a day (BID) | ORAL | Status: DC

## 2013-05-08 NOTE — Discharge Summary (Addendum)
Obstetric Discharge Summary Reason for Admission: cesarean section Prenatal Procedures:  Blood Pressure management        Maternal fetal medicine consultation Intrapartum Procedures: cesarean: low cervical, transverse and Bilateral partial salpingectomy Postpartum Procedures: Magnesium sulfate Complications-Operative and Postpartum: Probable chronic hypertension with superimposed preeclampsia          Acinetobactor UTI Hemoglobin  Date Value Range Status  05/07/2013 7.6* 12.0 - 15.0 g/dL Final     HCT  Date Value Range Status  05/07/2013 24.0* 36.0 - 46.0 % Final   Results for orders placed during the hospital encounter of 05/05/13 (from the past 48 hour(s))  URINE CULTURE     Status: None   Collection Time    05/06/13  6:45 PM      Result Value Range   Specimen Description URINE, CLEAN CATCH     Special Requests NONE     Culture  Setup Time 05/07/2013 01:58     Colony Count >=100,000 COLONIES/ML     Culture ACINETOBACTER CALCOACETICUS/BAUMANNII COMPLEX     Report Status 05/08/2013 FINAL     Organism ID, Bacteria ACINETOBACTER CALCOACETICUS/BAUMANNII COMPLEX    CBC WITH DIFFERENTIAL     Status: Abnormal   Collection Time    05/07/13 12:49 PM      Result Value Range   WBC 13.7 (*) 4.0 - 10.5 K/uL   RBC 3.74 (*) 3.87 - 5.11 MIL/uL   Hemoglobin 7.6 (*) 12.0 - 15.0 g/dL   HCT 16.1 (*) 09.6 - 04.5 %   MCV 64.2 (*) 78.0 - 100.0 fL   MCH 20.3 (*) 26.0 - 34.0 pg   MCHC 31.7  30.0 - 36.0 g/dL   RDW 40.9 (*) 81.1 - 91.4 %   Platelets 256  150 - 400 K/uL   Neutrophils Relative % 80 (*) 43 - 77 %   Lymphocytes Relative 13  12 - 46 %   Monocytes Relative 6  3 - 12 %   Eosinophils Relative 1  0 - 5 %   Basophils Relative 0  0 - 1 %   Neutro Abs 11.0 (*) 1.7 - 7.7 K/uL   Lymphs Abs 1.8  0.7 - 4.0 K/uL   Monocytes Absolute 0.8  0.1 - 1.0 K/uL   Eosinophils Absolute 0.1  0.0 - 0.7 K/uL   Basophils Absolute 0.0  0.0 - 0.1 K/uL   RBC Morphology POLYCHROMASIA PRESENT     Comment: TARGET  CELLS  COMPREHENSIVE METABOLIC PANEL     Status: Abnormal   Collection Time    05/07/13 12:49 PM      Result Value Range   Sodium 139  135 - 145 mEq/L   Comment: DELTA CHECK NOTED     REPEATED TO VERIFY   Potassium 3.9  3.5 - 5.1 mEq/L   Chloride 106  96 - 112 mEq/L   Comment: DELTA CHECK NOTED     REPEATED TO VERIFY   CO2 25  19 - 32 mEq/L   Glucose, Bld 100 (*) 70 - 99 mg/dL   BUN 11  6 - 23 mg/dL   Creatinine, Ser 7.82  0.50 - 1.10 mg/dL   Calcium 8.4  8.4 - 95.6 mg/dL   Total Protein 5.2 (*) 6.0 - 8.3 g/dL   Albumin 1.8 (*) 3.5 - 5.2 g/dL   AST 16  0 - 37 U/L   ALT 7  0 - 35 U/L   Alkaline Phosphatase 144 (*) 39 - 117 U/L   Total Bilirubin 0.2 (*)  0.3 - 1.2 mg/dL   GFR calc non Af Amer >90  >90 mL/min   GFR calc Af Amer >90  >90 mL/min   Comment: (NOTE)     The eGFR has been calculated using the CKD EPI equation.     This calculation has not been validated in all clinical situations.     eGFR's persistently <90 mL/min signify possible Chronic Kidney     Disease.   Physical Exam:  General: alert, cooperative, appears stated age and no distress Lochia: appropriate Uterine Fundus: firm Incision: healing well, no significant drainage DVT Evaluation: No evidence of DVT seen on physical exam.  Hospital course:  The patient was admitted to the hospital after her visit with maternal fetal medicine with the recommendation for immediate delivery. Because of uncontrolled hypertension. The differentiation between chronic hypertension with superimposed preeclampsia, and uncontrolled chronic hypertension. Could not be made. He and because of the potential danger to the mother and fetus. The maternal fetal medicine. Recommendation was for delivery. PIH labs were done with a protein to creatinine ratio of 0.29. The patient underwent repeat cesarean section and partial bilateral salpingectomy for desired surgical sterilization.  Postoperatively, the decision was made to prophylax against  preeclampsia  with magnesium sulfate for 24 hours.  After discontinuation of the magnesium sulfate, the patient was restarted on spironolactone which was used to manage her hyperaldosteronism prior to pregnancy.  She was restarted on labetalol 400 mg 3 times a day without control of her blood pressure. On postoperative day 3 this was discontinued and she was started on Procardia XL 30 mg daily with good blood pressure response. She did have a postoperative anemia with a hemoglobin of 7.6, however, she tolerated this well without any hemodynamic changes. She received excellent relief with her oral pain medication and was deemed to have received maximal hospital benefit on post operative day 3 and be ready for discharge home.  On the day of discharge her urine culture returned showing evidence of Acinetobacter sensitive to ciprofloxacin, which was added to the patient's discharge medication  Discharge Diagnoses: Preelampsia superimposed on chronic hypertension     2 prior cesarean sections     Desire for surgical sterilization     Hyperaldosteronism      Anemia      Acinetabacter UTI Discharge Information: Date: 05/08/2013 Activity: pelvic rest Diet: routine Medications: see after, visit summary Condition: improved Instructions: refer to practice specific booklet Discharge to: home Follow-up Information   Follow up with [facility] P, MD. (blood pressure check )    Specialty:  Obstetrics and Gynecology   Contact information:   [address] [place] [address] [phone]       Follow up with Hal Morales, MD In 6 weeks. (postpartum check)    Specialty:  Obstetrics and Gynecology   Contact information:   [address] [place] [address] [phone]       Newborn Data: Live born female  Birth Weight: 8 lb 5.2 oz (3775 g) APGAR: 8, 9 Plan for outpatient circumcision Home with mother.  Tish Begin P 05/08/2013, 4:04 PM

## 2013-05-08 NOTE — Progress Notes (Signed)
Subjective: Postpartum Day 3: Cesarean Delivery Patient reports no nausea, vomiting, incisional pain, is tolerating PO, + flatus and no problems voiding.   No headache or upper abdominal pain  Objective: Vital signs in last 24 hours: Temp:  [97.9 F (36.6 C)-98.7 F (37.1 C)] 97.9 F (36.6 C) (09/05 0606) Pulse Rate:  [69-89] 69 (09/05 0820) Resp:  [18-20] 20 (09/05 0606) BP: (165-211)/(71-87) 182/85 mmHg (09/05 0820)  Physical Exam:  General: alert, cooperative, appears stated age and no distress Lochia: appropriate Uterine Fundus: firm Incision: healing well, no significant drainage, honeycomb dressing in place DVT Evaluation: No evidence of DVT seen on physical exam.   Recent Labs  05/06/13 0525 05/07/13 1249  HGB 8.2* 7.6*  HCT 26.2* 24.0*    Assessment/Plan: Status post Cesarean section. Postoperative course complicated by continued hypertension s/p 24 hrs magnesium  Will change BP med to Procardia XL and observe.  Pt wants d/c home today if possible.  Psalm Arman P 05/08/2013, 9:49 AM

## 2013-05-09 LAB — TYPE AND SCREEN
Antibody Screen: NEGATIVE
Unit division: 0

## 2013-05-19 ENCOUNTER — Other Ambulatory Visit (HOSPITAL_COMMUNITY): Payer: BC Managed Care – PPO

## 2013-08-09 ENCOUNTER — Inpatient Hospital Stay (HOSPITAL_COMMUNITY)
Admission: EM | Admit: 2013-08-09 | Discharge: 2013-08-12 | DRG: 641 | Disposition: A | Discharge status: Home / Self Care | Payer: BC Managed Care – PPO | Attending: Internal Medicine | Admitting: Internal Medicine

## 2013-08-09 ENCOUNTER — Emergency Department (HOSPITAL_COMMUNITY): Payer: BC Managed Care – PPO

## 2013-08-09 ENCOUNTER — Encounter (HOSPITAL_COMMUNITY): Payer: Self-pay | Admitting: Emergency Medicine

## 2013-08-09 DIAGNOSIS — E269 Hyperaldosteronism, unspecified: Secondary | ICD-10-CM | POA: Diagnosis present

## 2013-08-09 DIAGNOSIS — Z803 Family history of malignant neoplasm of breast: Secondary | ICD-10-CM

## 2013-08-09 DIAGNOSIS — I159 Secondary hypertension, unspecified: Secondary | ICD-10-CM | POA: Diagnosis present

## 2013-08-09 DIAGNOSIS — I10 Essential (primary) hypertension: Secondary | ICD-10-CM

## 2013-08-09 DIAGNOSIS — Z9119 Patient's noncompliance with other medical treatment and regimen: Secondary | ICD-10-CM

## 2013-08-09 DIAGNOSIS — E876 Hypokalemia: Principal | ICD-10-CM | POA: Diagnosis present

## 2013-08-09 DIAGNOSIS — I152 Hypertension secondary to endocrine disorders: Secondary | ICD-10-CM | POA: Diagnosis present

## 2013-08-09 DIAGNOSIS — R6510 Systemic inflammatory response syndrome (SIRS) of non-infectious origin without acute organ dysfunction: Secondary | ICD-10-CM | POA: Diagnosis present

## 2013-08-09 DIAGNOSIS — D5740 Sickle-cell thalassemia without crisis: Secondary | ICD-10-CM | POA: Diagnosis present

## 2013-08-09 DIAGNOSIS — Z8249 Family history of ischemic heart disease and other diseases of the circulatory system: Secondary | ICD-10-CM

## 2013-08-09 DIAGNOSIS — R509 Fever, unspecified: Secondary | ICD-10-CM | POA: Diagnosis present

## 2013-08-09 DIAGNOSIS — D649 Anemia, unspecified: Secondary | ICD-10-CM | POA: Diagnosis present

## 2013-08-09 DIAGNOSIS — D509 Iron deficiency anemia, unspecified: Secondary | ICD-10-CM | POA: Diagnosis present

## 2013-08-09 DIAGNOSIS — E2609 Other primary hyperaldosteronism: Secondary | ICD-10-CM | POA: Diagnosis present

## 2013-08-09 DIAGNOSIS — Z91199 Patient's noncompliance with other medical treatment and regimen due to unspecified reason: Secondary | ICD-10-CM

## 2013-08-09 LAB — COMPREHENSIVE METABOLIC PANEL
ALT: 14 U/L (ref 0–35)
AST: 19 U/L (ref 0–37)
Alkaline Phosphatase: 86 U/L (ref 39–117)
CO2: 32 mEq/L (ref 19–32)
Chloride: 95 mEq/L — ABNORMAL LOW (ref 96–112)
GFR calc Af Amer: >90 mL/min (ref >90)
GFR calc non Af Amer: >90 mL/min (ref >90)
Glucose, Bld: 113 mg/dL — ABNORMAL HIGH (ref 70–99)
Potassium: 2 mEq/L — CL (ref 3.5–5.1)
Sodium: 140 mEq/L (ref 135–145)

## 2013-08-09 LAB — CBC WITH DIFFERENTIAL/PLATELET
Eosinophils Relative: 0 % (ref 0–5)
Lymphocytes Relative: 11 % — ABNORMAL LOW (ref 12–46)
MCV: 67.5 fL — ABNORMAL LOW (ref 78.0–100.0)
Monocytes Relative: 7 % (ref 3–12)
Neutrophils Relative %: 82 % — ABNORMAL HIGH (ref 43–77)
Platelets: 338 10*3/uL (ref 150–400)
RBC: 4.71 MIL/uL (ref 3.87–5.11)
WBC: 9.3 10*3/uL (ref 4.0–10.5)

## 2013-08-09 LAB — BASIC METABOLIC PANEL
BUN: 7 mg/dL (ref 6–23)
CO2: 32 mEq/L (ref 19–32)
Calcium: 8.2 mg/dL — ABNORMAL LOW (ref 8.4–10.5)
Chloride: 97 mEq/L (ref 96–112)
Creatinine, Ser: 0.79 mg/dL (ref 0.50–1.10)
GFR calc Af Amer: >90 mL/min (ref >90)
Glucose, Bld: 158 mg/dL — ABNORMAL HIGH (ref 70–99)

## 2013-08-09 LAB — POCT PREGNANCY, URINE: Preg Test, Ur: NEGATIVE

## 2013-08-09 LAB — URINALYSIS, ROUTINE W REFLEX MICROSCOPIC
Bilirubin Urine: NEGATIVE
Hgb urine dipstick: NEGATIVE
Ketones, ur: NEGATIVE mg/dL
Nitrite: NEGATIVE
Urobilinogen, UA: 1 mg/dL (ref 0.0–1.0)

## 2013-08-09 LAB — RETICULOCYTES: Retic Ct Pct: 1.2 % (ref 0.4–3.1)

## 2013-08-09 LAB — D-DIMER, QUANTITATIVE: D-Dimer, Quant: 0.49 ug/mL-FEU — ABNORMAL HIGH (ref 0.00–0.48)

## 2013-08-09 MED ORDER — HEPARIN SODIUM (PORCINE) 5000 UNIT/ML IJ SOLN
5000.0000 [IU] | Freq: Three times a day (TID) | INTRAMUSCULAR | Status: DC
  Administered 2013-08-09 – 2013-08-11 (×7): 5000 [IU] via SUBCUTANEOUS
  Filled 2013-08-09 (×11): qty 1

## 2013-08-09 MED ORDER — HYDROCODONE-ACETAMINOPHEN 5-325 MG PO TABS: 1.0000 | ORAL_TABLET | Freq: Four times a day (QID) | ORAL | Status: DC | PRN

## 2013-08-09 MED ORDER — SPIRONOLACTONE 100 MG PO TABS
200.0000 mg | ORAL_TABLET | Freq: Two times a day (BID) | ORAL | Status: DC
  Administered 2013-08-10 – 2013-08-12 (×5): 200 mg via ORAL
  Filled 2013-08-09 (×7): qty 2

## 2013-08-09 MED ORDER — POTASSIUM CHLORIDE CRYS ER 20 MEQ PO TBCR
40.0000 meq | EXTENDED_RELEASE_TABLET | ORAL | Status: AC
  Administered 2013-08-09 – 2013-08-10 (×2): 40 meq via ORAL
  Filled 2013-08-09 (×2): qty 2

## 2013-08-09 MED ORDER — SPIRONOLACTONE 100 MG PO TABS
200.0000 mg | ORAL_TABLET | Freq: Once | ORAL | Status: AC
  Administered 2013-08-09: 200 mg via ORAL
  Filled 2013-08-09 (×3): qty 2

## 2013-08-09 MED ORDER — ACETAMINOPHEN 325 MG PO TABS
650.0000 mg | ORAL_TABLET | Freq: Four times a day (QID) | ORAL | Status: DC | PRN
  Administered 2013-08-10: 650 mg via ORAL
  Filled 2013-08-09: qty 2

## 2013-08-09 MED ORDER — ACETAMINOPHEN 650 MG RE SUPP: 650.0000 mg | Freq: Four times a day (QID) | RECTAL | Status: DC | PRN

## 2013-08-09 MED ORDER — SODIUM CHLORIDE 0.9 % IJ SOLN
3.0000 mL | Freq: Two times a day (BID) | INTRAMUSCULAR | Status: DC
  Administered 2013-08-09 – 2013-08-11 (×5): 3 mL via INTRAVENOUS

## 2013-08-09 MED ORDER — POTASSIUM CHLORIDE 10 MEQ/100ML IV SOLN
10.0000 meq | INTRAVENOUS | Status: AC
  Administered 2013-08-09 – 2013-08-10 (×4): 10 meq via INTRAVENOUS
  Filled 2013-08-09 (×3): qty 100

## 2013-08-09 MED ORDER — POTASSIUM CHLORIDE 10 MEQ/100ML IV SOLN
10.0000 meq | INTRAVENOUS | Status: AC
  Administered 2013-08-09 (×3): 10 meq via INTRAVENOUS
  Filled 2013-08-09 (×3): qty 100

## 2013-08-09 MED ORDER — ACETAMINOPHEN 325 MG PO TABS
650.0000 mg | ORAL_TABLET | Freq: Four times a day (QID) | ORAL | Status: DC | PRN
  Administered 2013-08-09: 650 mg via ORAL
  Filled 2013-08-09: qty 2

## 2013-08-09 NOTE — ED Provider Notes (Signed)
Medical screening examination/treatment/procedure(s) were performed by non-physician practitioner and as supervising physician I was immediately available for consultation/collaboration.  EKG Interpretation    Date/Time:  Sunday August 09 2013 11:52:04 EST Ventricular Rate:  90 PR Interval:  171 QRS Duration: 82 QT Interval:  326 QTC Calculation: 399 R Axis:   56 Text Interpretation:  Sinus rhythm Borderline repolarization abnormality No significant change since last tracing Confirmed by Karma Ganja  MD, MARTHA [phone]) on 08/09/2013 12:51:03 PM             Ethelda Chick, MD 08/09/13 1535

## 2013-08-09 NOTE — ED Notes (Signed)
Admitting at bedside 

## 2013-08-09 NOTE — ED Notes (Signed)
Pt. Stated, I've got the flu or something but I think I have a fever and ache all over since Friday.

## 2013-08-09 NOTE — Progress Notes (Signed)
Potassium level resulted 2.1 mEq/L, MD paged.

## 2013-08-09 NOTE — H&P (Signed)
Date: 08/10/2013               Patient Name:  Krista [name] MRN: [id number]  DOB: 09/21/1980 Age / Sex: 32 y.o., female   PCP: Henreitta Leber, PA-C         Medical Service: Internal Medicine Teaching Service         Attending Physician: Dr. Aletta Edouard, MD    First Contact: Dr. Vivi Barrack Pager: [phone]  Second Contact: Dr. Leonia Reeves Pager: [phone]       After Hours (After 5p/  First Contact Pager: [phone]  weekends / holidays): Second Contact Pager: [phone]   Chief Complaint: Tingling sensation in her hands.  History of Present Illness:  Krista [name] is a 32 y.o. female PMH primary hyperaldosteronism, HTN, hypokalemia, s/p Caesarian section on 12/02/12 who presents today with fever and a tingling sensation in her hands.  Patient has had symptoms of fever and dry cough starting about 24 hours ago. This morning she also noted a tingling sensation in her hands, which she says is typical for her when she develops hypokalemia, and a headache. She has been missing doses of her spironolactone regularly (supposed to be taking 200mg  BID, usually takes about half that). She says this is because she forgets. No sick contacts, but she does have 4 children including an infant and home. She never gets the flu shot because her mother once developed Guillain-Barre syndrome. She denies sputum production, chest pain, shortness of breath, abdominal pain, nausea, vomiting, diarrhea, rash, dysuria, leg swelling or unilateral pain/tenderness. No vision changes, numbness, weakness. She is not breast feeding.  The patient has a history of primary hyperaldosteronism for years. She says doctors have told her that she is not a surgical candidate and have managed her medically with spironolactone. She was admitted to the hospitalist service on 12/03/11 for the same presentation (fever to 102, tingling in hands, hypokalemia to 2.3). Her K was repleted and she was discharged with supplements. Endocrinology  saw her in consultation and recommended spironolactone 200mg  BID. She was instructed to follow up with Dr. Sharl Ma after discharge, but did not attend the appointment. Her fever resolved without antibiotics and the etiology was not determined (UA negative, cultures negative). She has no family history of hyperaldosteronism.  In the ED she was noted to have a fever of 101.7 and BP of 206/107. Potassium was 2.0 on lab work.  She was given Tylenol, IV potassium, and 200mg  spironolactone. Pregnancy test was negative.   Meds: Current Facility-Administered Medications  Medication Dose Route Frequency Provider Last Rate Last Dose  . acetaminophen (TYLENOL) tablet 650 mg  650 mg Oral Q6H PRN Ky Barban, MD       Or  . acetaminophen (TYLENOL) suppository 650 mg  650 mg Rectal Q6H PRN Ky Barban, MD      . heparin injection 5,000 Units  5,000 Units Subcutaneous Q8H Ky Barban, MD   5,000 Units at 08/10/13 0545  . sodium chloride 0.9 % injection 3 mL  3 mL Intravenous Q12H Ky Barban, MD   3 mL at 08/09/13 2156  . spironolactone (ALDACTONE) tablet 200 mg  200 mg Oral BID Ky Barban, MD        Allergies: Allergies as of 08/09/2013 - Review Complete 08/09/2013  Allergen Reaction Noted  . Sulfa antibiotics Other (See Comments) 09/07/2011   Past Medical History  Diagnosis Date  . Hypokalemia 2007    IP Sept 2012  .  Hypertension   . Primary aldosteronism   . Scoliosis   . Aldosteronism 2012  . H/O thalassemia   . H/O varicella   . H/O candidiasis   . Hx: UTI (urinary tract infection) 2010  . Sickle cell trait   . Yeast infection   . H/O bacterial infection   . Dermatitis 2007  . BV (bacterial vaginosis) 2008  . Monilial vaginitis 2008  . Yeast vaginitis 2010  . CIN I (cervical intraepithelial neoplasia I) 2010  . Postpartum hypertension 07/06/10  . Pregnancy induced hypertension 2011  . Abnormal Pap smear 2010    Colpo & LEEP; LAST PAP 09/2011   . Infection     UTI X 1  . Thalassemia minor     CHRONIC  . Migraine headache     OTC  . Migraine    Past Surgical History  Procedure Laterality Date  . Breast reduction surgery  Jan 2006  . Cesarean section  2005 2011  . Dilation and curettage of uterus  2003  . Wisdom tooth extraction    . Cesarean section with bilateral tubal ligation Bilateral 05/05/2013    Procedure: REPEAT CESAREAN SECTION WITH BILATERAL TUBAL LIGATION,;  Surgeon: Hal Morales, MD;  Location: [facility];  Service: Obstetrics;  Laterality: Bilateral;   Family History  Problem Relation Age of Onset  . Thalassemia Mother   . Arthritis Mother   . Early death Mother 51  . Other Mother     GUILLON-BERRE SX; BLOOD CLOT  . Hypertension Father   . Heart attack Father   . Heart disease Father     MI  . Early death Father   . Kidney disease Father   . Breast cancer Maternal Aunt   . Breast cancer Maternal Grandmother   . Cancer Maternal Grandfather     Bone   History   Social History  . Marital Status: Divorced    Spouse Name: N/A    Number of Children: 3  . Years of Education: 15   Occupational History  . SCHOOL BUS DRIVER [employer]   Social History Main Topics  . Smoking status: Never Smoker   . Smokeless tobacco: Never Used  . Alcohol Use: No     Comment: OCC  . Drug Use: No  . Sexual Activity: Yes    Birth Control/ Protection: None   Other Topics Concern  . Not on file   Social History Narrative  . No narrative on file    Review of Systems: Pertinent items are noted in HPI.  Physical Exam: Blood pressure 172/86, pulse 92, temperature 98.9 F (37.2 C), temperature source Oral, resp. rate 22, height 5\' 8"  (1.727 m), weight 202 lb 6.1 oz (91.8 kg), last menstrual period 08/04/2013, SpO2 97.00%. Physical Exam  Constitutional: She is oriented to person, place, and time and well-developed, well-nourished, and in no distress.  HENT:  Head: Normocephalic and atraumatic.    Eyes: Conjunctivae and EOM are normal. Pupils are equal, round, and reactive to light.  Neck: Normal range of motion. Neck supple.  Cardiovascular: Regular rhythm and intact distal pulses.  Tachycardia present.   Murmur heard.  Systolic murmur is present with a grade of 2/6  Soft flow murmur, throughout precardium  Pulmonary/Chest: Effort normal and breath sounds normal. No respiratory distress. She has no wheezes. She has no rales. She exhibits no tenderness.  Abdominal: Soft. She exhibits no distension and no mass. There is no tenderness. There is no rebound and no  guarding.  Musculoskeletal: Normal range of motion. She exhibits no edema and no tenderness.  Neurological: She is alert and oriented to person, place, and time. GCS score is 15.  Skin: Skin is warm and dry. She is not diaphoretic.  Psychiatric: Affect normal.    Lab results: Basic Metabolic Panel:  Recent Labs  40/98/11 1049 08/09/13 1949  NA 140 139  K 2.0* 2.1*  CL 95* 97  CO2 32 32  GLUCOSE 113* 158*  BUN 8 7  CREATININE 0.77 0.79  CALCIUM 8.1* 8.2*   Liver Function Tests:  Recent Labs  08/09/13 1049  AST 19  ALT 14  ALKPHOS 86  BILITOT 0.4  PROT 7.5  ALBUMIN 3.3*   CBC:  Recent Labs  08/09/13 1049  WBC 9.3  NEUTROABS 7.6  HGB 9.7*  HCT 31.8*  MCV 67.5*  PLT 338   Urinalysis:  Recent Labs  08/09/13 1041  COLORURINE YELLOW  LABSPEC 1.013  PHURINE 8.0  GLUCOSEU NEGATIVE  HGBUR NEGATIVE  BILIRUBINUR NEGATIVE  KETONESUR NEGATIVE  PROTEINUR NEGATIVE  UROBILINOGEN 1.0  NITRITE NEGATIVE  LEUKOCYTESUR NEGATIVE    Imaging results:  Dg Chest 2 View  08/09/2013   CLINICAL DATA:  Fever, cough, sinus congestion  EXAM: CHEST  2 VIEW  COMPARISON:  12/03/2011  FINDINGS: Cardiomediastinal silhouette is stable. No acute infiltrate or pleural effusion. No pulmonary edema. Bony thorax is unremarkable.  IMPRESSION: No active cardiopulmonary disease.   Electronically Signed   By: Natasha Mead M.D.    On: 08/09/2013 11:31    Other results: EKG: normal sinus rhythm. Flattened T waves in precordial leads but not significantly changed from prior. Borderline Q wave in lead III, present in prior EKG but more prominent today. No S1, or inverted T in III.  Assessment & Plan by Problem: BRIYANA BADMAN is a 32 y.o. female PMH primary hyperaldosteronism, HTN, hypokalemia, s/p Caesarian section on 12/02/12 who presents today with fever and a tingling sensation in her hands.  #Hypokalemia - K is 2.0. She has a history of primary hyperaldosteronism and has been noncompliant with her spironolactone at home. She had a very similar admission on 12/03/11. She is status post 30 mEq of IV potassium. - Admit to IMTS, telemetry - Resume spironolactone 200mg  BID - Regular diet - BMP @ 1800 with additional IV potassium repletion as needed > 2.1, gave IV K x4 as well as Kdur 40 x2 - BMP tomorrow am  #Hypertension emergency - BP 206/107 on presentation. She has a history of primary hyperaldosteronism and has been noncompliant with her spironolactone at home. She complains of headache. - Resume spironolactone 200mg  BID > BP now 104/69  #Fever - Patient with fever of 101.7 as well as tachycardia to 106. She meets SIRS criteria. PE is a concern in this recently post-partum patient. Modified Wells Score is 1.5 (tachycardia). She has a borderline Q wave in lead III (present in prior EKGs, but more predominant today) however she does not have other EKG signs of right heart strain. She is satting well on room air. Chest XR is unremarkable. UA is unremarkable. No leukocytosis. Other symptoms include dry cough. Influenza is on the differential. She has not had a flu shot. No international travel. - D-dimer > 0.49 (wnl, reference range is 0-0.48) - Tylenol prn - Respiratory virus panel - Droplet isolation  #Chronic anemia - Stable. Patient with a history of alpha thalassemia and sickle cell trait. Her hemoglobin is at  baseline (9-10). -  Iron panel  #DVT PPX - Heparin subq  Dispo: Disposition is deferred at this time, awaiting improvement of current medical problems. Anticipated discharge in approximately 1-3 day(s).   The patient does have a current PCP (Henreitta Leber, PA-C) and does need an [facility] follow-up appointment after discharge.  The patient does not have transportation limitations that hinder transportation to clinic appointments.  Signed: Vivi Barrack, MD 08/10/2013, 6:47 AM

## 2013-08-09 NOTE — ED Notes (Signed)
Report given to 2C nurse 

## 2013-08-09 NOTE — ED Notes (Signed)
NOTIFIED PHYSICIANS IN PERSON OF PATIENT IN A-09 OF LAB RESULTS ( k ) POTASSIUM = < 2.0 mmoI/L 08/09/2013.

## 2013-08-09 NOTE — ED Provider Notes (Signed)
CSN: [id number]     Arrival date & time 08/09/13  4098 History   First MD Initiated Contact with Patient 08/09/13 1040     Chief Complaint  Patient presents with  . Fever   (Consider location/radiation/quality/duration/timing/severity/associated sxs/prior Treatment) HPI Comments: Patient with a history of Primary Aldosteronism and Hypokalemia presents today with a chief complaint of fever, dry cough, and numbness in both of her hands.  She reports that her symptoms have been present for the past three days.  She has not taken anything for her symptoms prior to arrival.  She states that her symptoms feel similar to when she has had a low Potassium in the past.  She reports that she is currently on Spironolactone, but has recently forgotten to take a few doses.  She denies nausea, vomiting, diarrhea, SOB, chest pain, or abdominal pain.  She states that she is currently is followed by Dwyane Dee with Endocrinology, but reports that she does not have a PCP.  The history is provided by the patient.    Past Medical History  Diagnosis Date  . Hypokalemia 2007    IP Sept 2012  . Hypertension   . Primary aldosteronism   . Scoliosis   . Aldosteronism 2012  . H/O thalassemia   . H/O varicella   . H/O candidiasis   . Hx: UTI (urinary tract infection) 2010  . Sickle cell trait   . Yeast infection   . H/O bacterial infection   . Dermatitis 2007  . BV (bacterial vaginosis) 2008  . Monilial vaginitis 2008  . Yeast vaginitis 2010  . CIN I (cervical intraepithelial neoplasia I) 2010  . Postpartum hypertension 07/06/10  . Pregnancy induced hypertension 2011  . Abnormal Pap smear 2010    Colpo & LEEP; LAST PAP 09/2011  . Infection     UTI X 1  . Thalassemia minor     CHRONIC  . Migraine headache     OTC  . Migraine    Past Surgical History  Procedure Laterality Date  . Breast reduction surgery  Jan 2006  . Cesarean section  2005 2011  . Dilation and curettage of uterus  2003  . Wisdom  tooth extraction    . Cesarean section with bilateral tubal ligation Bilateral 05/05/2013    Procedure: REPEAT CESAREAN SECTION WITH BILATERAL TUBAL LIGATION,;  Surgeon: Hal Morales, MD;  Location: [facility];  Service: Obstetrics;  Laterality: Bilateral;   Family History  Problem Relation Age of Onset  . Thalassemia Mother   . Arthritis Mother   . Early death Mother 68  . Other Mother     GUILLON-BERRE SX; BLOOD CLOT  . Hypertension Father   . Heart attack Father   . Heart disease Father     MI  . Early death Father   . Kidney disease Father   . Breast cancer Maternal Aunt   . Breast cancer Maternal Grandmother   . Cancer Maternal Grandfather     Bone   History  Substance Use Topics  . Smoking status: Never Smoker   . Smokeless tobacco: Never Used  . Alcohol Use: No     Comment: OCC   OB History   Grav Para Term Preterm Abortions TAB SAB Ect Mult Living   7 3 3  0 4 1 3  1 4      Review of Systems  All other systems reviewed and are negative.    Allergies  Sulfa antibiotics  Home Medications   Current  Outpatient Rx  Name  Route  Sig  Dispense  Refill  . spironolactone (ALDACTONE) 100 MG tablet   Oral   Take 2 tablets (200 mg total) by mouth 2 (two) times daily.   60 tablet   12    BP 179/95  Pulse 97  Temp(Src) 101.4 F (38.6 C) (Oral)  Resp 18  SpO2 99%  LMP 08/04/2013 Physical Exam  Nursing note and vitals reviewed. Constitutional: She appears well-developed and well-nourished. No distress.  HENT:  Head: Normocephalic and atraumatic.  Mouth/Throat: Oropharynx is clear and moist.  Neck: Normal range of motion. Neck supple.  Cardiovascular: Normal rate, regular rhythm and normal heart sounds.   Pulmonary/Chest: Effort normal and breath sounds normal.  Abdominal: Soft. Bowel sounds are normal. She exhibits no distension and no mass. There is no tenderness. There is no rebound and no guarding.  Musculoskeletal: Normal range of motion.  Neurological:  She is alert. She has normal strength. No cranial nerve deficit or sensory deficit.  Skin: Skin is warm and dry. She is not diaphoretic.  Psychiatric: She has a normal mood and affect.    ED Course  Procedures (including critical care time) Labs Review Labs Reviewed  CBC WITH DIFFERENTIAL - Abnormal; Notable for the following:    Hemoglobin 9.7 (*)    HCT 31.8 (*)    MCV 67.5 (*)    MCH 20.6 (*)    RDW 16.2 (*)    All other components within normal limits  COMPREHENSIVE METABOLIC PANEL - Abnormal; Notable for the following:    Potassium 2.0 (*)    Chloride 95 (*)    Glucose, Bld 113 (*)    Calcium 8.1 (*)    Albumin 3.3 (*)    All other components within normal limits  URINE CULTURE  URINALYSIS, ROUTINE W REFLEX MICROSCOPIC  POCT PREGNANCY, URINE   Imaging Review Dg Chest 2 View  08/09/2013   CLINICAL DATA:  Fever, cough, sinus congestion  EXAM: CHEST  2 VIEW  COMPARISON:  12/03/2011  FINDINGS: Cardiomediastinal silhouette is stable. No acute infiltrate or pleural effusion. No pulmonary edema. Bony thorax is unremarkable.  IMPRESSION: No active cardiopulmonary disease.   Electronically Signed   By: Natasha Mead M.D.   On: 08/09/2013 11:31    EKG Interpretation    Date/Time:  Sunday August 09 2013 11:52:04 EST Ventricular Rate:  90 PR Interval:  171 QRS Duration: 82 QT Interval:  326 QTC Calculation: 399 R Axis:   56 Text Interpretation:  Sinus rhythm Borderline repolarization abnormality No significant change since last tracing Confirmed by Karma Ganja  MD, MARTHA [phone]) on 08/09/2013 12:51:03 PM            MDM  No diagnosis found. Patient with a history of Primary Aldosteronism presents today with a chief complaint of fever, dry cough, and numbness and tingling of both her hands.  CXR negative.  UA negative.  Labs unremarkable aside from a Potassium of 2.0.  EKG unremarkable.  Patient given IV Potassium in the ED and admitted to Internal Medicine teaching service for  further management of Hypokalemia.      Santiago Glad, PA-C 08/09/13 1531

## 2013-08-09 NOTE — ED Notes (Signed)
Pt's BP elevated- notified teaching service. MD would like to see how pt responds to aldactone before giving any other medications. Pt denies headache and dizziness.

## 2013-08-10 LAB — FERRITIN: Ferritin: 15 ng/mL (ref 10–291)

## 2013-08-10 LAB — IRON AND TIBC
Iron: 16 ug/dL — ABNORMAL LOW (ref 42–135)
Saturation Ratios: 6 % — ABNORMAL LOW (ref 20–55)
TIBC: 285 ug/dL (ref 250–470)

## 2013-08-10 LAB — URINE CULTURE

## 2013-08-10 LAB — BASIC METABOLIC PANEL
BUN: 6 mg/dL (ref 6–23)
CO2: 32 mEq/L (ref 19–32)
CO2: 29 mEq/L (ref 19–32)
Calcium: 7.9 mg/dL — ABNORMAL LOW (ref 8.4–10.5)
Chloride: 101 mEq/L (ref 96–112)
Chloride: 104 mEq/L (ref 96–112)
Creatinine, Ser: 0.77 mg/dL (ref 0.50–1.10)
GFR calc Af Amer: >90 mL/min (ref >90)
GFR calc Af Amer: >90 mL/min (ref >90)
GFR calc non Af Amer: >90 mL/min (ref >90)
Glucose, Bld: 114 mg/dL — ABNORMAL HIGH (ref 70–99)
Glucose, Bld: 126 mg/dL — ABNORMAL HIGH (ref 70–99)
Potassium: 2.9 mEq/L — ABNORMAL LOW (ref 3.5–5.1)

## 2013-08-10 MED ORDER — POTASSIUM CHLORIDE 10 MEQ/100ML IV SOLN
10.0000 meq | INTRAVENOUS | Status: DC
  Filled 2013-08-10: qty 100

## 2013-08-10 MED ORDER — GUAIFENESIN 100 MG/5ML PO SYRP
200.0000 mg | ORAL_SOLUTION | ORAL | Status: DC | PRN
  Filled 2013-08-10 (×2): qty 10

## 2013-08-10 MED ORDER — METOPROLOL TARTRATE 25 MG PO TABS
25.0000 mg | ORAL_TABLET | Freq: Two times a day (BID) | ORAL | Status: DC
  Administered 2013-08-10 (×2): 25 mg via ORAL
  Filled 2013-08-10 (×4): qty 1

## 2013-08-10 MED ORDER — POTASSIUM CHLORIDE CRYS ER 20 MEQ PO TBCR
40.0000 meq | EXTENDED_RELEASE_TABLET | ORAL | Status: AC
  Administered 2013-08-10 – 2013-08-11 (×3): 40 meq via ORAL
  Filled 2013-08-10 (×3): qty 2

## 2013-08-10 MED ORDER — METOPROLOL TARTRATE 50 MG PO TABS: 50.0000 mg | ORAL_TABLET | Freq: Two times a day (BID) | ORAL | Status: DC

## 2013-08-10 MED ORDER — POTASSIUM CHLORIDE CRYS ER 20 MEQ PO TBCR
40.0000 meq | EXTENDED_RELEASE_TABLET | ORAL | Status: AC
  Administered 2013-08-10 (×3): 40 meq via ORAL
  Filled 2013-08-10 (×3): qty 2

## 2013-08-10 MED ORDER — POTASSIUM CHLORIDE CRYS ER 20 MEQ PO TBCR: 40.0000 meq | EXTENDED_RELEASE_TABLET | ORAL | Status: DC

## 2013-08-10 MED ORDER — SALINE SPRAY 0.65 % NA SOLN
1.0000 | NASAL | Status: DC | PRN
  Administered 2013-08-10: 1 via NASAL
  Filled 2013-08-10: qty 44

## 2013-08-10 NOTE — Progress Notes (Signed)
CRITICAL VALUE ALERT  Critical value received:  Potassium 2.5  Date of notification:  08/10/13  Time of notification:    Critical value read back:yes  Nurse who received alert:  Wilmon Pali  MD notified (1st page):  Dr.Carter  Time of first page:  0955  MD notified (2nd page):  Time of second page:  Responding MD:  Dr.Carter  Time MD responded:  1005

## 2013-08-10 NOTE — Progress Notes (Signed)
Subjective: Patient seen and examined. She still has a dry cough and now complains of runny nose. Denies muscle aches. Denies weakness, numbness, tingling.   Objective: Vital signs in last 24 hours: Filed Vitals:   08/09/13 1956 08/09/13 2336 08/10/13 0511 08/10/13 0900  BP: 164/90 180/104 172/86 171/105  Pulse: 109 98 92 94  Temp: 99.1 F (37.3 C) 98.9 F (37.2 C) 98.9 F (37.2 C) 99 F (37.2 C)  TempSrc: Oral Oral Oral Oral  Resp:  15 22 14   Height:      Weight:   202 lb 6.1 oz (91.8 kg)   SpO2: 100% 99% 97% 99%   Weight change:   Intake/Output Summary (Last 24 hours) at 08/10/13 1228 Last data filed at 08/10/13 0146  Gross per 24 hour  Intake   1144 ml  Output      0 ml  Net   1144 ml   Physical Exam  Constitutional: She is oriented to person, place, and time and well-developed, well-nourished, and in no distress.  HENT:  Head: Normocephalic and atraumatic.  Eyes: Conjunctivae and EOM are normal. Pupils are equal, round, and reactive to light.  Neck: Normal range of motion. Neck supple.  Cardiovascular: Regular rhythm and rate and intact distal pulses. Murmur heard.  Systolic murmur is present with a grade of 2/6  Soft flow murmur, throughout precordium but loudest at LLSB Pulmonary/Chest: Effort normal and breath sounds normal. No respiratory distress. She has no wheezes. She has no rales. She exhibits no tenderness.  Abdominal: Soft. She exhibits no distension and no mass. There is no tenderness. There is no rebound and no guarding.  Musculoskeletal: Normal range of motion. She exhibits no edema and no tenderness.  Neurological: She is alert and oriented to person, place, and time. GCS score is 15.  Skin: Skin is warm and dry. She is not diaphoretic.  Psychiatric: Affect normal.   Lab Results: Basic Metabolic Panel:  Recent Labs Lab 08/09/13 1949 08/10/13 0745  NA 139 141  K 2.1* 2.5*  CL 97 101  CO2 32 32  GLUCOSE 158* 114*  BUN 7 5*  CREATININE  0.79 0.77  CALCIUM 8.2* 7.9*   Liver Function Tests:  Recent Labs Lab 08/09/13 1049  AST 19  ALT 14  ALKPHOS 86  BILITOT 0.4  PROT 7.5  ALBUMIN 3.3*   No results found for this basename: LIPASE, AMYLASE,  in the last 168 hours No results found for this basename: AMMONIA,  in the last 168 hours CBC:  Recent Labs Lab 08/09/13 1049  WBC 9.3  NEUTROABS 7.6  HGB 9.7*  HCT 31.8*  MCV 67.5*  PLT 338   Cardiac Enzymes: No results found for this basename: CKTOTAL, CKMB, CKMBINDEX, TROPONINI,  in the last 168 hours BNP: No results found for this basename: PROBNP,  in the last 168 hours D-Dimer:  Recent Labs Lab 08/09/13 1055  DDIMER 0.49*   CBG: No results found for this basename: GLUCAP,  in the last 168 hours Hemoglobin A1C: No results found for this basename: HGBA1C,  in the last 168 hours Fasting Lipid Panel: No results found for this basename: CHOL, HDL, LDLCALC, TRIG, CHOLHDL, LDLDIRECT,  in the last 168 hours Thyroid Function Tests: No results found for this basename: TSH, T4TOTAL, FREET4, T3FREE, THYROIDAB,  in the last 168 hours Coagulation: No results found for this basename: LABPROT, INR,  in the last 168 hours Anemia Panel:  Recent Labs Lab 08/09/13 1949  VITAMINB12 280  FOLATE 5.4  FERRITIN 15  TIBC 285  IRON 16*  RETICCTPCT 1.2   Urine Drug Screen: Drugs of Abuse     Component Value Date/Time   LABOPIA NONE DETECTED 07/09/2007 1100   COCAINSCRNUR NONE DETECTED 07/09/2007 1100   LABBENZ NONE DETECTED 07/09/2007 1100   AMPHETMU NONE DETECTED 07/09/2007 1100   THCU NONE DETECTED 07/09/2007 1100   LABBARB  Value: NONE DETECTED        DRUG SCREEN FOR MEDICAL PURPOSES ONLY.  IF CONFIRMATION IS NEEDED FOR ANY PURPOSE, NOTIFY LAB WITHIN 5 DAYS. 07/09/2007 1100    Alcohol Level: No results found for this basename: ETH,  in the last 168 hours Urinalysis:  Recent Labs Lab 08/09/13 1041  COLORURINE YELLOW  LABSPEC 1.013  PHURINE 8.0  GLUCOSEU  NEGATIVE  HGBUR NEGATIVE  BILIRUBINUR NEGATIVE  KETONESUR NEGATIVE  PROTEINUR NEGATIVE  UROBILINOGEN 1.0  NITRITE NEGATIVE  LEUKOCYTESUR NEGATIVE    Micro Results: Recent Results (from the past 240 hour(s))  MRSA PCR SCREENING     Status: None   Collection Time    08/09/13  3:27 PM      Result Value Range Status   MRSA by PCR NEGATIVE  NEGATIVE Final   Comment:            The GeneXpert MRSA Assay (FDA     approved for NASAL specimens     only), is one component of a     comprehensive MRSA colonization     surveillance program. It is not     intended to diagnose MRSA     infection nor to guide or     monitor treatment for     MRSA infections.   Studies/Results: Dg Chest 2 View  08/09/2013   CLINICAL DATA:  Fever, cough, sinus congestion  EXAM: CHEST  2 VIEW  COMPARISON:  12/03/2011  FINDINGS: Cardiomediastinal silhouette is stable. No acute infiltrate or pleural effusion. No pulmonary edema. Bony thorax is unremarkable.  IMPRESSION: No active cardiopulmonary disease.   Electronically Signed   By: Natasha Mead M.D.   On: 08/09/2013 11:31   Medications: I have reviewed the patient's current medications. Scheduled Meds: . heparin  5,000 Units Subcutaneous Q8H  . potassium chloride  40 mEq Oral Q4H  . sodium chloride  3 mL Intravenous Q12H  . spironolactone  200 mg Oral BID   Continuous Infusions:  PRN Meds:.acetaminophen, acetaminophen Assessment/Plan: Krista [name] is a 32 y.o. female PMH primary hyperaldosteronism, HTN, hypokalemia, s/p Caesarian section on 12/02/12 who presents today with fever and a tingling sensation in her hands.   #Hypokalemia 2/2 primary hyperaldosteronism - K=2.5 this am. K 2.0 on admission. She has a history of primary hyperaldosteronism and has been noncompliant with her spironolactone at home. She is apparently not a candidate for adrenalectomy. She had a very similar admission on 12/03/11. She is status post 70 mEq of IV potassium and K-dur 80. -  Continue spironolactone 200mg  BID  - KDur every 4 hours x 3 - Regular diet  - Repeat BMP @1900  and replete K as needed - BMP am  Potassium  Date Value Range Status  08/10/2013 2.5* 3.5 - 5.1 mEq/L Final     CRITICAL RESULT CALLED TO, READ BACK BY AND VERIFIED WITH:     K. Kandace Blitz [phone] [facility]  08/09/2013 2.1* 3.5 - 5.1 mEq/L Final     CRITICAL RESULT CALLED TO, READ BACK BY AND VERIFIED WITH:     HUCGON,M RN  08/09/13 2054 WOOTEN,K  08/09/2013 2.0* 3.5 - 5.1 mEq/L Final     CRITICAL RESULT CALLED TO, READ BACK BY AND VERIFIED WITH:     ADDINS,S RN 08/09/13 1141 WOOTEN,K    #Hypertension emergency - BP 206/107 on presentation. She has a history of primary hyperaldosteronism and has been noncompliant with her spironolactone at home. She initially complained of headache, but this has resolved.  - Resume home spironolactone 200mg  BID > BP 172/86 today - Continue to monitor, will start additional medication as indicated if BP elevation persists after correction of hyperaldosteronism  #Fever - Patient with fever of 101.7 on admission as well as tachycardia to 106. She meets SIRS criteria. PE is a concern in this recently post-partum patient. Modified Wells Score is 1.5 (tachycardia). She has a borderline Q wave in lead III (present in prior EKGs, but more predominant today) however she does not have other EKG signs of right heart strain. She is satting well on room air. Chest XR is unremarkable. UA is unremarkable. No leukocytosis. Other symptoms include dry cough. Influenza is on the differential. She has not had a flu shot. No international travel.  - D-dimer > 0.49 (wnl, reference range is 0-0.48)  - Tylenol prn  - Respiratory virus panel > In process, called [facility] and they said it would be a 2 day turn around - Droplet isolation  - Robitussin prn cough - Saline nasal spray prn congestion  #Chronic microcytic anemia - Stable. Baseline Hgb 8-9, MCV 60's. Patient with a history  of alpha thalassemia and sickle cell trait. Her hemoglobin is at baseline (9-10).  - Iron panel > Retic count normal, iron 16 (low), TIBC 285 (wnl), sat ratio 6 (low), ferritin 15 (wnl), folate 5.4 (wnl), B12 280 (wnl)  #DVT PPX - Heparin subq   Dispo: Disposition is deferred at this time, awaiting improvement of current medical problems.  Anticipated discharge in approximately 1-3 day(s).   The patient does have a current PCP (Henreitta Leber, PA-C) and does need an [facility] hospital follow-up appointment after discharge.  The patient does not have transportation limitations that hinder transportation to clinic appointments.  .Services Needed at time of discharge: Y = Yes, Blank = No PT:   OT:   RN:   Equipment:   Other:     LOS: 1 day   Vivi Barrack, MD 08/10/2013, 12:28 PM

## 2013-08-10 NOTE — H&P (Signed)
    Date: 08/10/2013      Patient name: Krista [name]  Medical record number: [id number]  Date of birth: 05-07-1981  Assessment: 54 F with Primary Hyperaldosteronism, admitted for severe hypokalemia due non-compliance. Other issues: Hypertension, recent C-section in September 2014.    Exam today: Feels better, numbness and tingling that she was feeling in her hands and feet at the time of admission is completely gone. She does have some URI symptoms at this time, had fever at admission but not any more, and she denies bodyache, myalgias chest pain and rashes. Her vitals:    08/09/13 1956 08/09/13 2336 08/10/13 0511 08/10/13 0900  BP: 164/90 180/104 172/86 171/105  Pulse: 109 98 92 94  Temp: 99.1 F (37.3 C) 98.9 F (37.2 C) 98.9 F (37.2 C) 99 F (37.2 C)  TempSrc: Oral Oral Oral Oral  Resp:  15 22 14   SpO2: 100% 99% 97% 99%   Her exam is significant for tachycardia, normal heart sounds, no chest tenderness, normal lung sounds, no focal neurological deficits, no pedal edema.     Plan Primary hyperaldosteronism - This diagnosis has been listed in her medical history since 2012; however, I do see a CT abdomen pelvis that has been done in 2010, which lists the reason as Primary Hyperaldosteronism. The CT was non-revealing for any adrenal lesions. I do not see any aldosterone levels as well in our records. The patient reports that this work up was done years ago, and she has been told that she is not a surgical candidate. She has been non-compliant and not kept her appointments with the endocrinologist.   Hypokalemia secondary to [facility]. Hyperaldosteronism - We will continue to monitor. It is still 2.1 today. We will replete as needed. We will also do a magnesium level.   Hypertension also likely secondary to [facility]. Hyperladosteronism - For now, continuing spironolactone at her home dose. She might need another medication if this is not able to control her BP by tomorrow.   Case reviewed with IM  resident team; rest of the plan per resident note,   Dalphine Handing, [facility] 08/10/2013, 12:57 PM.

## 2013-08-10 NOTE — Progress Notes (Signed)
Pt BP elevated sys 191 paged Dr.Carter. Dr.Carter stated to call if BP >200 Sys. Will continue to monitor pt. New orders received.

## 2013-08-11 DIAGNOSIS — D509 Iron deficiency anemia, unspecified: Secondary | ICD-10-CM

## 2013-08-11 LAB — BASIC METABOLIC PANEL
BUN: 6 mg/dL (ref 6–23)
BUN: 8 mg/dL (ref 6–23)
CO2: 27 mEq/L (ref 19–32)
CO2: 24 mEq/L (ref 19–32)
Chloride: 105 mEq/L (ref 96–112)
Chloride: 99 mEq/L (ref 96–112)
Creatinine, Ser: 0.79 mg/dL (ref 0.50–1.10)
GFR calc Af Amer: >90 mL/min (ref >90)
GFR calc Af Amer: >90 mL/min (ref >90)
GFR calc non Af Amer: >90 mL/min (ref >90)
Glucose, Bld: 110 mg/dL — ABNORMAL HIGH (ref 70–99)
Glucose, Bld: 93 mg/dL (ref 70–99)
Potassium: 3.9 mEq/L (ref 3.5–5.1)
Potassium: 3.9 mEq/L (ref 3.5–5.1)
Sodium: 141 mEq/L (ref 135–145)

## 2013-08-11 LAB — RESPIRATORY VIRUS PANEL
Adenovirus: NOT DETECTED
Influenza A H1: NOT DETECTED
Influenza A: NOT DETECTED
Parainfluenza 2: NOT DETECTED
Respiratory Syncytial Virus A: NOT DETECTED
Respiratory Syncytial Virus B: NOT DETECTED
Rhinovirus: NOT DETECTED

## 2013-08-11 MED ORDER — METOPROLOL TARTRATE 25 MG PO TABS
25.0000 mg | ORAL_TABLET | Freq: Two times a day (BID) | ORAL | Status: DC
  Filled 2013-08-11: qty 1

## 2013-08-11 MED ORDER — AMLODIPINE BESYLATE 5 MG PO TABS
5.0000 mg | ORAL_TABLET | Freq: Every day | ORAL | Status: DC
  Administered 2013-08-11: 5 mg via ORAL
  Filled 2013-08-11: qty 1

## 2013-08-11 MED ORDER — AMLODIPINE BESYLATE 10 MG PO TABS
10.0000 mg | ORAL_TABLET | Freq: Every day | ORAL | Status: DC
  Administered 2013-08-12: 10 mg via ORAL
  Filled 2013-08-11: qty 1

## 2013-08-11 MED ORDER — HYDROCHLOROTHIAZIDE 25 MG PO TABS
25.0000 mg | ORAL_TABLET | Freq: Every day | ORAL | Status: DC
  Administered 2013-08-11 – 2013-08-12 (×2): 25 mg via ORAL
  Filled 2013-08-11 (×2): qty 1

## 2013-08-11 MED ORDER — METOPROLOL TARTRATE 25 MG PO TABS
25.0000 mg | ORAL_TABLET | Freq: Once | ORAL | Status: AC
  Administered 2013-08-11: 25 mg via ORAL
  Filled 2013-08-11: qty 1

## 2013-08-11 MED ORDER — AMLODIPINE BESYLATE 5 MG PO TABS
5.0000 mg | ORAL_TABLET | Freq: Once | ORAL | Status: AC
  Administered 2013-08-11: 5 mg via ORAL
  Filled 2013-08-11: qty 1

## 2013-08-11 MED ORDER — METOPROLOL TARTRATE 50 MG PO TABS
50.0000 mg | ORAL_TABLET | Freq: Two times a day (BID) | ORAL | Status: DC
  Administered 2013-08-11: 50 mg via ORAL
  Filled 2013-08-11 (×2): qty 1

## 2013-08-11 MED ORDER — METOPROLOL TARTRATE 50 MG PO TABS
50.0000 mg | ORAL_TABLET | Freq: Two times a day (BID) | ORAL | Status: DC
  Administered 2013-08-11 – 2013-08-12 (×2): 50 mg via ORAL
  Filled 2013-08-11 (×3): qty 1

## 2013-08-11 NOTE — Progress Notes (Signed)
Utilization Review Completed.  

## 2013-08-11 NOTE — Progress Notes (Addendum)
Subjective: Patient seen and examined. Her cough and rhinorrhea are improving. Denies muscle aches. Denies weakness, numbness, tingling.   Objective: Vital signs in last 24 hours: Filed Vitals:   08/10/13 2100 08/11/13 0056 08/11/13 0418 08/11/13 0746  BP: 180/99 160/106 190/111 196/97  Pulse:    84  Temp: 98.9 F (37.2 C) 98.5 F (36.9 C) 98.9 F (37.2 C) 99.1 F (37.3 C)  TempSrc: Oral Oral Oral Oral  Resp: 14 19 18 22   Height:      Weight:   214 lb 15.2 oz (97.5 kg)   SpO2:    100%   Weight change: 13 lb 14.2 oz (6.3 kg)  Intake/Output Summary (Last 24 hours) at 08/11/13 0802 Last data filed at 08/11/13 0115  Gross per 24 hour  Intake    723 ml  Output      0 ml  Net    723 ml   Physical Exam  Constitutional: She is oriented to person, place, and time and well-developed, well-nourished, and in no distress.  HENT:  Head: Normocephalic and atraumatic.  Eyes: Conjunctivae and EOM are normal. Pupils are equal, round, and reactive to light.  Neck: Normal range of motion. Neck supple.  Cardiovascular: Regular rhythm and rate and intact distal pulses. Murmur heard.  Systolic murmur is present with a grade of 2/6  Soft flow murmur, throughout precordium but loudest at LLSB Pulmonary/Chest: Effort normal and breath sounds normal. No respiratory distress. She has no wheezes. She has no rales. She exhibits no tenderness.  Abdominal: Soft. She exhibits no distension and no mass. There is no tenderness. There is no rebound and no guarding.  Musculoskeletal: Normal range of motion. She exhibits no edema and no tenderness.  Neurological: She is alert and oriented to person, place, and time. GCS score is 15.  Skin: Skin is warm and dry. She is not diaphoretic.  Psychiatric: Affect normal.   Lab Results: Basic Metabolic Panel:  Recent Labs Lab 08/10/13 1825 08/11/13 0435  NA 144 141  K 2.9* 3.9  CL 104 105  CO2 29 27  GLUCOSE 126* 110*  BUN 6 6  CREATININE 0.83 0.74    CALCIUM 8.4 8.6  MG 1.8  --    Liver Function Tests:  Recent Labs Lab 08/09/13 1049  AST 19  ALT 14  ALKPHOS 86  BILITOT 0.4  PROT 7.5  ALBUMIN 3.3*   No results found for this basename: LIPASE, AMYLASE,  in the last 168 hours No results found for this basename: AMMONIA,  in the last 168 hours CBC:  Recent Labs Lab 08/09/13 1049  WBC 9.3  NEUTROABS 7.6  HGB 9.7*  HCT 31.8*  MCV 67.5*  PLT 338   Cardiac Enzymes: No results found for this basename: CKTOTAL, CKMB, CKMBINDEX, TROPONINI,  in the last 168 hours BNP: No results found for this basename: PROBNP,  in the last 168 hours D-Dimer:  Recent Labs Lab 08/09/13 1055  DDIMER 0.49*   CBG: No results found for this basename: GLUCAP,  in the last 168 hours Hemoglobin A1C: No results found for this basename: HGBA1C,  in the last 168 hours Fasting Lipid Panel: No results found for this basename: CHOL, HDL, LDLCALC, TRIG, CHOLHDL, LDLDIRECT,  in the last 168 hours Thyroid Function Tests: No results found for this basename: TSH, T4TOTAL, FREET4, T3FREE, THYROIDAB,  in the last 168 hours Coagulation: No results found for this basename: LABPROT, INR,  in the last 168 hours Anemia Panel:  Recent Labs Lab 08/09/13 1949  VITAMINB12 280  FOLATE 5.4  FERRITIN 15  TIBC 285  IRON 16*  RETICCTPCT 1.2   Urine Drug Screen: Drugs of Abuse     Component Value Date/Time   LABOPIA NONE DETECTED 07/09/2007 1100   COCAINSCRNUR NONE DETECTED 07/09/2007 1100   LABBENZ NONE DETECTED 07/09/2007 1100   AMPHETMU NONE DETECTED 07/09/2007 1100   THCU NONE DETECTED 07/09/2007 1100   LABBARB  Value: NONE DETECTED        DRUG SCREEN FOR MEDICAL PURPOSES ONLY.  IF CONFIRMATION IS NEEDED FOR ANY PURPOSE, NOTIFY LAB WITHIN 5 DAYS. 07/09/2007 1100    Alcohol Level: No results found for this basename: ETH,  in the last 168 hours Urinalysis:  Recent Labs Lab 08/09/13 1041  COLORURINE YELLOW  LABSPEC 1.013  PHURINE 8.0  GLUCOSEU  NEGATIVE  HGBUR NEGATIVE  BILIRUBINUR NEGATIVE  KETONESUR NEGATIVE  PROTEINUR NEGATIVE  UROBILINOGEN 1.0  NITRITE NEGATIVE  LEUKOCYTESUR NEGATIVE    Micro Results: Recent Results (from the past 240 hour(s))  URINE CULTURE     Status: None   Collection Time    08/09/13 10:41 AM      Result Value Range Status   Specimen Description URINE, CLEAN CATCH   Final   Special Requests NONE   Final   Culture  Setup Time     Final   Value: 08/09/2013 18:22     Performed at [facility] Count     Final   Value: 55,000 COLONIES/ML     Performed at [facility]   Culture     Final   Value: Multiple bacterial morphotypes present, none predominant. Suggest appropriate recollection if clinically indicated.     Performed at [facility]   Report Status 08/10/2013 FINAL   Final  MRSA PCR SCREENING     Status: None   Collection Time    08/09/13  3:27 PM      Result Value Range Status   MRSA by PCR NEGATIVE  NEGATIVE Final   Comment:            The GeneXpert MRSA Assay (FDA     approved for NASAL specimens     only), is one component of a     comprehensive MRSA colonization     surveillance program. It is not     intended to diagnose MRSA     infection nor to guide or     monitor treatment for     MRSA infections.   Studies/Results: Dg Chest 2 View  08/09/2013   CLINICAL DATA:  Fever, cough, sinus congestion  EXAM: CHEST  2 VIEW  COMPARISON:  12/03/2011  FINDINGS: Cardiomediastinal silhouette is stable. No acute infiltrate or pleural effusion. No pulmonary edema. Bony thorax is unremarkable.  IMPRESSION: No active cardiopulmonary disease.   Electronically Signed   By: Natasha Mead M.D.   On: 08/09/2013 11:31   Medications: I have reviewed the patient's current medications. Scheduled Meds: . heparin  5,000 Units Subcutaneous Q8H  . metoprolol tartrate  50 mg Oral BID  . sodium chloride  3 mL Intravenous Q12H  . spironolactone  200 mg Oral BID    Continuous Infusions:  PRN Meds:.acetaminophen, acetaminophen, guaifenesin, sodium chloride Assessment/Plan: Krista [name] is a 32 y.o. female PMH primary hyperaldosteronism, HTN, hypokalemia, s/p Caesarian section on 12/02/12 who presents today with fever and a tingling sensation in her hands.   #Hypokalemia 2/2 primary hyperaldosteronism -  Resolved. K 2.0 on admission. She has a history of primary hyperaldosteronism and has been noncompliant with her spironolactone at home. She is apparently not a candidate for adrenalectomy. She had a very similar admission on 12/03/11. She is status post 70 mEq of IV potassium and K-dur 140. - Continue spironolactone 200mg  BID  - Regular diet  - Medically stable for discharge when BP under better control. She will need endocrine follow up (Dr. Sharl Ma) and primary care follow up in our clinic (no PCP).  Potassium  Date Value Range Status  08/11/2013 3.9  3.5 - 5.1 mEq/L Final     DELTA CHECK NOTED  08/10/2013 2.9* 3.5 - 5.1 mEq/L Final  08/10/2013 2.5* 3.5 - 5.1 mEq/L Final     CRITICAL RESULT CALLED TO, READ BACK BY AND VERIFIED WITH:     Ginette Pitman [phone] [facility]    #Hypertension emergency - BP 206/107 on presentation with headache. She has a history of primary hyperaldosteronism and has been noncompliant with her spironolactone at home. Still with hypertension to 160-90's systolic. - Home spironolactone 200mg  BID - Metoprolol 50mg  BID - Amlodipine 10mg  daily  #Fever - Patient with fever of 101.7 on admission as well as tachycardia to 106. She meets SIRS criteria. PE is a concern in this recently post-partum patient. Modified Wells Score is 1.5 (tachycardia). She has a borderline Q wave in lead III (present in prior EKGs, but more predominant today) however she does not have other EKG signs of right heart strain. She is satting well on room air. Chest XR is unremarkable. UA is unremarkable. No leukocytosis. Other symptoms include dry cough.  Influenza is on the differential. She has not had a flu shot. No international travel. Symptoms are improving today. - D-dimer > 0.49 (wnl, reference range is 0-0.48)  - Tylenol prn  - Respiratory virus panel > In process, called [facility] and they said it would be a 2 day turn around - Droplet isolation  - Robitussin prn cough - Saline nasal spray prn congestion  #Chronic microcytic anemia - Stable. Baseline Hgb 8-9, MCV 60's. Patient with a history of alpha thalassemia and sickle cell trait. Her hemoglobin is at baseline (9-10).  - Iron panel > Retic count normal, iron 16 (low), TIBC 285 (wnl), sat ratio 6 (low), ferritin 15 (wnl), folate 5.4 (wnl), B12 280 (wnl)  #DVT PPX - Heparin subq   Dispo: Disposition is deferred at this time, awaiting improvement of current medical problems.  Anticipated discharge in approximately 1-3 day(s).   The patient does have a current PCP (Henreitta Leber, PA-C) and does need an [facility] hospital follow-up appointment after discharge.  The patient does not have transportation limitations that hinder transportation to clinic appointments.  .Services Needed at time of discharge: Y = Yes, Blank = No PT:   OT:   RN:   Equipment:   Other:     LOS: 2 days   Vivi Barrack, MD 08/11/2013, 8:02 AM

## 2013-08-12 DIAGNOSIS — R6510 Systemic inflammatory response syndrome (SIRS) of non-infectious origin without acute organ dysfunction: Secondary | ICD-10-CM

## 2013-08-12 DIAGNOSIS — R509 Fever, unspecified: Secondary | ICD-10-CM

## 2013-08-12 MED ORDER — METOPROLOL TARTRATE 50 MG PO TABS: 50.0000 mg | ORAL_TABLET | Freq: Two times a day (BID) | ORAL | Status: DC

## 2013-08-12 MED ORDER — AMLODIPINE BESYLATE 10 MG PO TABS: 10.0000 mg | ORAL_TABLET | Freq: Every day | ORAL | Status: DC

## 2013-08-12 MED ORDER — HYDROCHLOROTHIAZIDE 25 MG PO TABS: 25.0000 mg | ORAL_TABLET | Freq: Every day | ORAL | Status: DC

## 2013-08-12 NOTE — Progress Notes (Signed)
Subjective: Patient seen and examined. She feels well and wants to go home to her kids. Denies muscle aches. Denies weakness, numbness, tingling.   Objective: Vital signs in last 24 hours: Filed Vitals:   08/11/13 2000 08/11/13 2021 08/11/13 2336 08/12/13 0500  BP: 181/110 181/109 161/106 154/94  Pulse: 81 77 74 71  Temp:  98.6 F (37 C) 98.5 F (36.9 C) 98.2 F (36.8 C)  TempSrc:  Oral Oral Oral  Resp: 25 18 19 17   Height:      Weight:    214 lb 15.2 oz (97.5 kg)  SpO2: 100% 100% 100% 100%   Weight change: 0 lb (0 kg)  Intake/Output Summary (Last 24 hours) at 08/12/13 1610 Last data filed at 08/11/13 2200  Gross per 24 hour  Intake    862 ml  Output      0 ml  Net    862 ml   Physical Exam  Constitutional: She is oriented to person, place, and time and well-developed, well-nourished, and in no distress.  HENT:  Head: Normocephalic and atraumatic.  Eyes: Conjunctivae and EOM are normal. Pupils are equal, round, and reactive to light.  Neck: Normal range of motion. Neck supple.  Cardiovascular: Regular rhythm and rate and intact distal pulses. Murmur heard.  Systolic murmur is present with a grade of 2/6  Soft flow murmur, throughout precordium but loudest at LLSB Pulmonary/Chest: Effort normal and breath sounds normal. No respiratory distress. She has no wheezes. She has no rales. She exhibits no tenderness.  Abdominal: Soft. She exhibits no distension and no mass. There is no tenderness. There is no rebound and no guarding.  Musculoskeletal: Normal range of motion. She exhibits no edema and no tenderness.  Neurological: She is alert and oriented to person, place, and time. GCS score is 15.  Skin: Skin is warm and dry. She is not diaphoretic.  Psychiatric: Affect normal.   Lab Results: Basic Metabolic Panel:  Recent Labs Lab 08/10/13 1825 08/11/13 0435 08/11/13 1900  NA 144 141 136  K 2.9* 3.9 3.9  CL 104 105 99  CO2 29 27 24   GLUCOSE 126* 110* 93  BUN 6 6  8   CREATININE 0.83 0.74 0.79  CALCIUM 8.4 8.6 9.6  MG 1.8  --   --    Liver Function Tests:  Recent Labs Lab 08/09/13 1049  AST 19  ALT 14  ALKPHOS 86  BILITOT 0.4  PROT 7.5  ALBUMIN 3.3*   No results found for this basename: LIPASE, AMYLASE,  in the last 168 hours No results found for this basename: AMMONIA,  in the last 168 hours CBC:  Recent Labs Lab 08/09/13 1049  WBC 9.3  NEUTROABS 7.6  HGB 9.7*  HCT 31.8*  MCV 67.5*  PLT 338   Cardiac Enzymes: No results found for this basename: CKTOTAL, CKMB, CKMBINDEX, TROPONINI,  in the last 168 hours BNP: No results found for this basename: PROBNP,  in the last 168 hours D-Dimer:  Recent Labs Lab 08/09/13 1055  DDIMER 0.49*   CBG: No results found for this basename: GLUCAP,  in the last 168 hours Hemoglobin A1C: No results found for this basename: HGBA1C,  in the last 168 hours Fasting Lipid Panel: No results found for this basename: CHOL, HDL, LDLCALC, TRIG, CHOLHDL, LDLDIRECT,  in the last 168 hours Thyroid Function Tests: No results found for this basename: TSH, T4TOTAL, FREET4, T3FREE, THYROIDAB,  in the last 168 hours Coagulation: No results found for this  basename: LABPROT, INR,  in the last 168 hours Anemia Panel:  Recent Labs Lab 08/09/13 1949  VITAMINB12 280  FOLATE 5.4  FERRITIN 15  TIBC 285  IRON 16*  RETICCTPCT 1.2   Urine Drug Screen: Drugs of Abuse     Component Value Date/Time   LABOPIA NONE DETECTED 07/09/2007 1100   COCAINSCRNUR NONE DETECTED 07/09/2007 1100   LABBENZ NONE DETECTED 07/09/2007 1100   AMPHETMU NONE DETECTED 07/09/2007 1100   THCU NONE DETECTED 07/09/2007 1100   LABBARB  Value: NONE DETECTED        DRUG SCREEN FOR MEDICAL PURPOSES ONLY.  IF CONFIRMATION IS NEEDED FOR ANY PURPOSE, NOTIFY LAB WITHIN 5 DAYS. 07/09/2007 1100    Alcohol Level: No results found for this basename: ETH,  in the last 168 hours Urinalysis:  Recent Labs Lab 08/09/13 1041  COLORURINE YELLOW    LABSPEC 1.013  PHURINE 8.0  GLUCOSEU NEGATIVE  HGBUR NEGATIVE  BILIRUBINUR NEGATIVE  KETONESUR NEGATIVE  PROTEINUR NEGATIVE  UROBILINOGEN 1.0  NITRITE NEGATIVE  LEUKOCYTESUR NEGATIVE    Micro Results: Recent Results (from the past 240 hour(s))  URINE CULTURE     Status: None   Collection Time    08/09/13 10:41 AM      Result Value Range Status   Specimen Description URINE, CLEAN CATCH   Final   Special Requests NONE   Final   Culture  Setup Time     Final   Value: 08/09/2013 18:22     Performed at [facility] Count     Final   Value: 55,000 COLONIES/ML     Performed at [facility]   Culture     Final   Value: Multiple bacterial morphotypes present, none predominant. Suggest appropriate recollection if clinically indicated.     Performed at [facility]   Report Status 08/10/2013 FINAL   Final  RESPIRATORY VIRUS PANEL     Status: None   Collection Time    08/09/13  2:18 PM      Result Value Range Status   Source - RVPAN NASOPHARYNGEAL   Final   Respiratory Syncytial Virus A NOT DETECTED   Final   Respiratory Syncytial Virus B NOT DETECTED   Final   Influenza A NOT DETECTED   Final   Influenza B NOT DETECTED   Final   Parainfluenza 1 NOT DETECTED   Final   Parainfluenza 2 NOT DETECTED   Final   Parainfluenza 3 NOT DETECTED   Final   Metapneumovirus NOT DETECTED   Final   Rhinovirus NOT DETECTED   Final   Adenovirus NOT DETECTED   Final   Influenza A H1 NOT DETECTED   Final   Influenza A H3 NOT DETECTED   Final   Comment: (NOTE)           Normal Reference Range for each Analyte: NOT DETECTED     Testing performed using the [employer] xTAG Respiratory Viral Panel test     kit.     This test was developed and its performance characteristics determined     by [facility]. It has not been cleared or approved by the [place]     Food and Drug Administration. This test is used for clinical purposes.     It should not be regarded as  investigational or for research. This     laboratory is certified under the Clinical Laboratory Improvement     Amendments of 1988 (CLIA)  as qualified to perform high complexity     clinical laboratory testing.     Performed at [facility]  MRSA PCR SCREENING     Status: None   Collection Time    08/09/13  3:27 PM      Result Value Range Status   MRSA by PCR NEGATIVE  NEGATIVE Final   Comment:            The GeneXpert MRSA Assay (FDA     approved for NASAL specimens     only), is one component of a     comprehensive MRSA colonization     surveillance program. It is not     intended to diagnose MRSA     infection nor to guide or     monitor treatment for     MRSA infections.   Studies/Results: No results found. Medications: I have reviewed the patient's current medications. Scheduled Meds: . amLODipine  10 mg Oral Daily  . heparin  5,000 Units Subcutaneous Q8H  . hydrochlorothiazide  25 mg Oral Daily  . metoprolol tartrate  50 mg Oral BID  . sodium chloride  3 mL Intravenous Q12H  . spironolactone  200 mg Oral BID   Continuous Infusions:  PRN Meds:.acetaminophen, acetaminophen, guaifenesin, sodium chloride Assessment/Plan: Krista [name] is a 32 y.o. female PMH primary hyperaldosteronism, HTN, hypokalemia, s/p Caesarian section on 12/02/12 who presents today with fever and a tingling sensation in her hands.   #Hypokalemia 2/2 primary hyperaldosteronism - Resolved, see below for trend. K 2.0 on admission. She has a history of primary hyperaldosteronism and has been noncompliant with her spironolactone at home. She is apparently not a candidate for adrenalectomy. She had a very similar admission on 12/03/11. She is status post 70 mEq of IV potassium and K-dur 140. - Continue spironolactone 200mg  BID  - Regular diet  - Medically stable for discharge today. I have arranged for endocrine follow up (Dr. Sharl Ma) and primary care follow up in our clinic (as she has no  PCP).  Potassium  Date Value Range Status  08/11/2013 3.9  3.5 - 5.1 mEq/L Final  08/11/2013 3.9  3.5 - 5.1 mEq/L Final     DELTA CHECK NOTED  08/10/2013 2.9* 3.5 - 5.1 mEq/L Final    #Hypertension emergency - BP 206/107 on presentation with headache. She has a history of primary hyperaldosteronism and has been noncompliant with her spironolactone at home. BP improved to 150s/90s today on regimen below. - Home spironolactone 200mg  BID - Metoprolol 50mg  BID - Amlodipine 10mg  daily - HCTZ 25mg   #Fever - Patient with fever of 101.7 on admission as well as tachycardia to 106. She meets SIRS criteria. PE is a concern in this recently post-partum patient. Modified Wells Score is 1.5 (tachycardia). She has a borderline Q wave in lead III (present in prior EKGs, but more predominant today) however she does not have other EKG signs of right heart strain. She is satting well on room air. Chest XR is unremarkable. UA is unremarkable. No leukocytosis. Other symptoms include dry cough. Influenza is on the differential. She has not had a flu shot. No international travel. Symptoms are improving today.  - D-dimer > 0.49 (wnl, reference range is 0-0.48)  - Tylenol prn  - Respiratory virus panel > negative - D/c Droplet isolation  - Robitussin prn cough - Saline nasal spray prn congestion  #Chronic microcytic anemia - Stable. Baseline Hgb 8-9, MCV 60's. Patient with a history of alpha thalassemia  and sickle cell trait. Her hemoglobin is at baseline (9-10).  - Iron panel > Retic count normal, iron 16 (low), TIBC 285 (wnl), sat ratio 6 (low), ferritin 15 (wnl), folate 5.4 (wnl), B12 280 (wnl)  #DVT PPX - Heparin subq   Dispo: Disposition is deferred at this time, awaiting improvement of current medical problems.  Anticipated discharge in approximately 1-3 day(s).   The patient does not have a current PCP (None) and does need an [facility] hospital follow-up appointment after discharge.  The patient does not  have transportation limitations that hinder transportation to clinic appointments.  .Services Needed at time of discharge: Y = Yes, Blank = No PT:   OT:   RN:   Equipment:   Other:     LOS: 3 days   Krista Barrack, MD 08/12/2013, 7:33 AM

## 2013-08-12 NOTE — Discharge Summary (Signed)
Name: Krista [name] MRN: [id number] DOB: 1981/05/09 32 y.o. PCP: Henreitta Leber, PA-C  Date of Admission: 08/09/2013  9:57 AM Date of Discharge: 08/12/2013 Attending Physician: Aletta Edouard, MD  Discharge Diagnosis: 1. Malignant hypertension due to primary aldosteronism 2. Hypokalemia 3. Chronic microcytic Anemia 4. Fever with SIRS (systemic inflammatory response syndrome)  Discharge Medications:   Medication List         amLODipine 10 MG tablet  Commonly known as:  NORVASC  Take 1 tablet (10 mg total) by mouth daily.     hydrochlorothiazide 25 MG tablet  Commonly known as:  HYDRODIURIL  Take 1 tablet (25 mg total) by mouth daily.     metoprolol 50 MG tablet  Commonly known as:  LOPRESSOR  Take 1 tablet (50 mg total) by mouth 2 (two) times daily.     spironolactone 100 MG tablet  Commonly known as:  ALDACTONE  Take 2 tablets (200 mg total) by mouth 2 (two) times daily.        Disposition and follow-up:   Ms.Krista [name] was discharged from [facility] in Stable condition.  At the hospital follow up visit please address:  1.  BP control.  2.  Labs / imaging needed at time of follow-up: BMP (for K)  3.  Pending labs/ test needing follow-up: None  Follow-up Appointments: Follow-up Information   Follow up with Christen Bame, MD On 08/19/2013. (@8 :00am. This is your new Primary Care Doctor. They know you have an appointment with Dr. Sharl Ma afterwards at 9:45am and will make sure you can make it.)    Specialty:  Internal Medicine   Contact information:   [address] [place] [address] [phone]       Follow up with Talmage Coin, MD On 08/19/2013. (@9 :45am. This is your endocrinologist.)    Specialty:  Endocrinology   Contact information:   [address] [place] [address] [phone]       Discharge Instructions: Discharge Orders   Future Appointments Provider Department Dept Phone   08/19/2013 8:15  AM Christen Bame, MD [facility] [phone]   Future Orders Complete By Expires   Diet - low sodium heart healthy  As directed    Increase activity slowly  As directed       Consultations:  None  Procedures Performed:  Dg Chest 2 View  08/09/2013   CLINICAL DATA:  Fever, cough, sinus congestion  EXAM: CHEST  2 VIEW  COMPARISON:  12/03/2011  FINDINGS: Cardiomediastinal silhouette is stable. No acute infiltrate or pleural effusion. No pulmonary edema. Bony thorax is unremarkable.  IMPRESSION: No active cardiopulmonary disease.   Electronically Signed   By: Krista [name] M.D.   On: 08/09/2013 11:31    Admission HPI:  Krista [name] is a 32 y.o. female PMH primary hyperaldosteronism, HTN, hypokalemia, s/p Caesarian section on 12/02/12 who presents today with fever and a tingling sensation in her hands.   Patient has had symptoms of fever and dry cough starting about 24 hours ago. This morning she also noted a tingling sensation in her hands, which she says is typical for her when she develops hypokalemia, and a headache. She has been missing doses of her spironolactone regularly (supposed to be taking 200mg  BID, usually takes about half that). She says this is because she forgets. No sick contacts, but she does have 4 children including an infant and home. She never gets the flu shot because her  mother once developed Guillain-Barre syndrome. She denies sputum production, chest pain, shortness of breath, abdominal pain, nausea, vomiting, diarrhea, rash, dysuria, leg swelling or unilateral pain/tenderness. No vision changes, numbness, weakness. She is not breast feeding.   The patient has a history of primary hyperaldosteronism for years. She says doctors have told her that she is not a surgical candidate and have managed her medically with spironolactone. She was admitted to the hospitalist service on 12/03/11 for the same presentation (fever to 102, tingling in hands, hypokalemia to  2.3). Her K was repleted and she was discharged with supplements. Endocrinology saw her in consultation and recommended spironolactone 200mg  BID. She was instructed to follow up with Dr. Sharl Ma after discharge, but did not attend the appointment. Her fever resolved without antibiotics and the etiology was not determined (UA negative, cultures negative). She has no family history of hyperaldosteronism.   In the ED she was noted to have a fever of 101.7 and BP of 206/107. Potassium was 2.0 on lab work. She was given Tylenol, IV potassium, and 200mg  spironolactone. Pregnancy test was negative.  Physical Exam:  Blood pressure 172/86, pulse 92, temperature 98.9 F (37.2 C), temperature source Oral, resp. rate 22, height 5\' 8"  (1.727 m), weight 202 lb 6.1 oz (91.8 kg), last menstrual period 08/04/2013, SpO2 97.00%.  Physical Exam  Constitutional: She is oriented to person, place, and time and well-developed, well-nourished, and in no distress.  HENT:  Head: Normocephalic and atraumatic.  Eyes: Conjunctivae and EOM are normal. Pupils are equal, round, and reactive to light.  Neck: Normal range of motion. Neck supple.  Cardiovascular: Regular rhythm and intact distal pulses. Tachycardia present.  Murmur heard.  Systolic murmur is present with a grade of 2/6  Soft flow murmur, throughout precardium  Pulmonary/Chest: Effort normal and breath sounds normal. No respiratory distress. She has no wheezes. She has no rales. She exhibits no tenderness.  Abdominal: Soft. She exhibits no distension and no mass. There is no tenderness. There is no rebound and no guarding.  Musculoskeletal: Normal range of motion. She exhibits no edema and no tenderness.  Neurological: She is alert and oriented to person, place, and time. GCS score is 15.  Skin: Skin is warm and dry. She is not diaphoretic.  Psychiatric: Affect normal.   Hospital Course by problem list:   1. Hypokalemia 2/2 primary hyperaldosteronism - K  2.0 on admission. She has a history of primary hyperaldosteronism and had been noncompliant with her spironolactone at home. She is apparently not a candidate for adrenalectomy. She had a very similar admission on 12/03/11. Her potassium improved status post 70 mEq of IV potassium and K-dur 140 and continuation of her spironolactone 200mg  BID. She will need endocrine follow up (Dr. Sharl Ma) and primary care follow up in our clinic (no PCP). I have scheduled these appointments for her and counseled her on the importance of attending them. I made them on the same morning to make compliance easier.  Potassium  Date Value Range Status  08/11/2013 3.9  3.5 - 5.1 mEq/L Final  08/11/2013 3.9  3.5 - 5.1 mEq/L Final     DELTA CHECK NOTED  08/10/2013 2.9* 3.5 - 5.1 mEq/L Final  08/10/2013 2.5* 3.5 - 5.1 mEq/L Final     CRITICAL RESULT CALLED TO, READ BACK BY AND VERIFIED WITH:     Ginette Pitman [phone] SHIPMANM  08/09/2013 2.1* 3.5 - 5.1 mEq/L Final     CRITICAL RESULT CALLED TO, READ BACK BY  AND VERIFIED WITH:     HUCGON,M RN 08/09/13 2054 WOOTEN,K    2. Hypertension emergency - BP 206/107 on presentation with headache. She has a history of primary hyperaldosteronism and has been noncompliant with her spironolactone at home. On her home spironolactone alone, her BP was still 160-90's systolic. Therefore we added Metoprolol 50mg  BID, Amlodipine 10mg  daily, and HCTZ 25mg . Her BP on discharge was 150s/90s.  3. Fever - Patient with fever of 101.7 on admission as well as tachycardia to 106. She met SIRS criteria. Unknown etiology. PE was a concern in this recently post-partum patient. Modified Wells Score was 1.5 (tachycardia). She had a borderline Q wave in lead III (present in prior EKGs, but more predominant today), however she did not have other EKG signs of right heart strain. D-dimer was checked and within normal limits. She was satting well on room air. Chest XR was unremarkable. UA was unremarkable. No  leukocytosis. Other symptoms included dry cough. Influenza was on the differential. (She has not had a flu shot.) However her respiratory virus panel was negative. No international travel. We gave her prn Tylenol, robitussin for cough, and saline nasal spray with symptomatic improvement by discharge.  4. Chronic microcytic anemia - Hgb stable at her baseline of 8-10, MCV 60's. Patient with a history of alpha thalassemia and sickle cell trait. We did check an iron panel as there was not one in the system yet, showing Retic count normal, iron 16 (low), TIBC 285 (wnl), sat ratio 6 (low), ferritin 15 (wnl), folate 5.4 (wnl), B12 280 (wnl).   Discharge Vitals:   BP 163/102  Pulse 78  Temp(Src) 98.3 F (36.8 C) (Oral)  Resp 15  Ht 5\' 8"  (1.727 m)  Wt 214 lb 15.2 oz (97.5 kg)  BMI 32.69 kg/m2  SpO2 100%  LMP 08/04/2013  Discharge Labs:  Results for orders placed during the hospital encounter of 08/09/13 (from the past 24 hour(s))  BASIC METABOLIC PANEL     Status: None   Collection Time    08/11/13  7:00 PM      Result Value Range   Sodium 136  135 - 145 mEq/L   Potassium 3.9  3.5 - 5.1 mEq/L   Chloride 99  96 - 112 mEq/L   CO2 24  19 - 32 mEq/L   Glucose, Bld 93  70 - 99 mg/dL   BUN 8  6 - 23 mg/dL   Creatinine, Ser 2.13  0.50 - 1.10 mg/dL   Calcium 9.6  8.4 - 08.6 mg/dL   GFR calc non Af Amer >90  >90 mL/min   GFR calc Af Amer >90  >90 mL/min   CBC    Component Value Date/Time   WBC 9.3 08/09/2013 1049   RBC 4.62 08/09/2013 1949   RBC 4.71 08/09/2013 1049   HGB 9.7* 08/09/2013 1049   HCT 31.8* 08/09/2013 1049   PLT 338 08/09/2013 1049   MCV 67.5* 08/09/2013 1049   MCH 20.6* 08/09/2013 1049   MCHC 30.5 08/09/2013 1049   RDW 16.2* 08/09/2013 1049   LYMPHSABS 1.0 08/09/2013 1049   MONOABS 0.7 08/09/2013 1049   EOSABS 0.0 08/09/2013 1049   BASOSABS 0.0 08/09/2013 1049    Signed: Vivi Barrack, MD 08/12/2013, 12:38 PM   Time Spent on Discharge: 30 minutes Services Ordered on  Discharge: None Equipment Ordered on Discharge: None

## 2013-08-14 NOTE — Discharge Summary (Signed)
   Date: 08/14/2013    Patient name: Krista [name]  MRN: [id number]  Date of birth: [birth year]  I evaluated the patient on the day of discharge and discussed the discharge plan with my resident team. I agree with the discharge documentation and disposition.     Dalphine Handing, Caiden Monsivais 08/14/2013, 2:05 PM

## 2013-08-19 ENCOUNTER — Ambulatory Visit: Payer: BC Managed Care – PPO | Admitting: Internal Medicine

## 2014-07-05 ENCOUNTER — Encounter (HOSPITAL_COMMUNITY): Payer: Self-pay | Admitting: Emergency Medicine

## 2014-07-13 ENCOUNTER — Emergency Department (HOSPITAL_COMMUNITY)
Admission: EM | Admit: 2014-07-13 | Discharge: 2014-07-13 | Disposition: A | Discharge status: Home / Self Care | Payer: BC Managed Care – PPO | Attending: Emergency Medicine | Admitting: Emergency Medicine

## 2014-07-13 ENCOUNTER — Encounter (HOSPITAL_COMMUNITY): Payer: Self-pay | Admitting: *Deleted

## 2014-07-13 DIAGNOSIS — Z832 Family history of diseases of the blood and blood-forming organs and certain disorders involving the immune mechanism: Secondary | ICD-10-CM | POA: Diagnosis not present

## 2014-07-13 DIAGNOSIS — Z3490 Encounter for supervision of normal pregnancy, unspecified, unspecified trimester: Secondary | ICD-10-CM | POA: Diagnosis not present

## 2014-07-13 DIAGNOSIS — Z8739 Personal history of other diseases of the musculoskeletal system and connective tissue: Secondary | ICD-10-CM | POA: Insufficient documentation

## 2014-07-13 DIAGNOSIS — Z79899 Other long term (current) drug therapy: Secondary | ICD-10-CM | POA: Insufficient documentation

## 2014-07-13 DIAGNOSIS — Z8619 Personal history of other infectious and parasitic diseases: Secondary | ICD-10-CM | POA: Insufficient documentation

## 2014-07-13 DIAGNOSIS — R011 Cardiac murmur, unspecified: Secondary | ICD-10-CM | POA: Insufficient documentation

## 2014-07-13 DIAGNOSIS — E876 Hypokalemia: Secondary | ICD-10-CM | POA: Insufficient documentation

## 2014-07-13 DIAGNOSIS — I10 Essential (primary) hypertension: Secondary | ICD-10-CM | POA: Diagnosis present

## 2014-07-13 DIAGNOSIS — I152 Hypertension secondary to endocrine disorders: Secondary | ICD-10-CM | POA: Insufficient documentation

## 2014-07-13 DIAGNOSIS — Z872 Personal history of diseases of the skin and subcutaneous tissue: Secondary | ICD-10-CM | POA: Insufficient documentation

## 2014-07-13 DIAGNOSIS — E349 Endocrine disorder, unspecified: Secondary | ICD-10-CM | POA: Insufficient documentation

## 2014-07-13 DIAGNOSIS — G43909 Migraine, unspecified, not intractable, without status migrainosus: Secondary | ICD-10-CM | POA: Insufficient documentation

## 2014-07-13 DIAGNOSIS — Z87440 Personal history of urinary (tract) infections: Secondary | ICD-10-CM | POA: Insufficient documentation

## 2014-07-13 LAB — CBC WITH DIFFERENTIAL/PLATELET
BASOS ABS: 0 10*3/uL (ref 0.0–0.1)
Basophils Relative: 0 % (ref 0–1)
EOS PCT: 2 % (ref 0–5)
Eosinophils Absolute: 0.2 10*3/uL (ref 0.0–0.7)
HEMATOCRIT: 35.7 % — AB (ref 36.0–46.0)
Hemoglobin: 11.4 g/dL — ABNORMAL LOW (ref 12.0–15.0)
LYMPHS ABS: 2.1 10*3/uL (ref 0.7–4.0)
LYMPHS PCT: 27 % (ref 12–46)
MCH: 20.8 pg — ABNORMAL LOW (ref 26.0–34.0)
MCHC: 31.9 g/dL (ref 30.0–36.0)
MCV: 65 fL — AB (ref 78.0–100.0)
Monocytes Absolute: 0.3 10*3/uL (ref 0.1–1.0)
Monocytes Relative: 4 % (ref 3–12)
NEUTROS ABS: 5 10*3/uL (ref 1.7–7.7)
Neutrophils Relative %: 67 % (ref 43–77)
Platelets: 400 10*3/uL (ref 150–400)
RBC: 5.49 MIL/uL — AB (ref 3.87–5.11)
RDW: 15.1 % (ref 11.5–15.5)
WBC: 7.6 10*3/uL (ref 4.0–10.5)

## 2014-07-13 LAB — BASIC METABOLIC PANEL
Anion gap: 14 (ref 5–15)
BUN: 10 mg/dL (ref 6–23)
CHLORIDE: 101 meq/L (ref 96–112)
CO2: 25 meq/L (ref 19–32)
Calcium: 9.5 mg/dL (ref 8.4–10.5)
Creatinine, Ser: 0.8 mg/dL (ref 0.50–1.10)
GFR calc Af Amer: >90 mL/min (ref >90)
GFR calc non Af Amer: >90 mL/min (ref >90)
Glucose, Bld: 87 mg/dL (ref 70–99)
POTASSIUM: 3.7 meq/L (ref 3.7–5.3)
SODIUM: 140 meq/L (ref 137–147)

## 2014-07-13 LAB — PRO B NATRIURETIC PEPTIDE: PRO B NATRI PEPTIDE: 37.8 pg/mL (ref 0–125)

## 2014-07-13 MED ORDER — CLONIDINE HCL 0.1 MG PO TABS
0.1000 mg | ORAL_TABLET | Freq: Once | ORAL | Status: AC
  Administered 2014-07-13: 0.1 mg via ORAL
  Filled 2014-07-13: qty 1

## 2014-07-13 NOTE — Discharge Instructions (Signed)
°Hypertension °Hypertension, commonly called high blood pressure, is when the force of blood pumping through your arteries is too strong. Your arteries are the blood vessels that carry blood from your heart throughout your body. A blood pressure reading consists of a higher number over a lower number, such as 110/72. The higher number (systolic) is the pressure inside your arteries when your heart pumps. The lower number (diastolic) is the pressure inside your arteries when your heart relaxes. Ideally you want your blood pressure below 120/80. °Hypertension forces your heart to work harder to pump blood. Your arteries may become narrow or stiff. Having hypertension puts you at risk for heart disease, stroke, and other problems.  °RISK FACTORS °Some risk factors for high blood pressure are controllable. Others are not.  °Risk factors you cannot control include:  °· Race. You may be at higher risk if you are African American. °· Age. Risk increases with age. °· Gender. Men are at higher risk than women before age 45 years. After age 65, women are at higher risk than men. °Risk factors you can control include: °· Not getting enough exercise or physical activity. °· Being overweight. °· Getting too much fat, sugar, calories, or salt in your diet. °· Drinking too much alcohol. °SIGNS AND SYMPTOMS °Hypertension does not usually cause signs or symptoms. Extremely high blood pressure (hypertensive crisis) may cause headache, anxiety, shortness of breath, and nosebleed. °DIAGNOSIS  °To check if you have hypertension, your health care provider will measure your blood pressure while you are seated, with your arm held at the level of your heart. It should be measured at least twice using the same arm. Certain conditions can cause a difference in blood pressure between your right and left arms. A blood pressure reading that is higher than normal on one occasion does not mean that you need treatment. If one blood pressure  reading is high, ask your health care provider about having it checked again. °TREATMENT  °Treating high blood pressure includes making lifestyle changes and possibly taking medicine. Living a healthy lifestyle can help lower high blood pressure. You may need to change some of your habits. °Lifestyle changes may include: °· Following the DASH diet. This diet is high in fruits, vegetables, and whole grains. It is low in salt, red meat, and added sugars. °· Getting at least 2½ hours of brisk physical activity every week. °· Losing weight if necessary. °· Not smoking. °· Limiting alcoholic beverages. °· Learning ways to reduce stress. ° If lifestyle changes are not enough to get your blood pressure under control, your health care provider may prescribe medicine. You may need to take more than one. Work closely with your health care provider to understand the risks and benefits. °HOME CARE INSTRUCTIONS °· Have your blood pressure rechecked as directed by your health care provider.   °· Take medicines only as directed by your health care provider. Follow the directions carefully. Blood pressure medicines must be taken as prescribed. The medicine does not work as well when you skip doses. Skipping doses also puts you at risk for problems.   °· Do not smoke.   °· Monitor your blood pressure at home as directed by your health care provider.  °SEEK MEDICAL CARE IF:  °· You think you are having a reaction to medicines taken. °· You have recurrent headaches or feel dizzy. °· You have swelling in your ankles. °· You have trouble with your vision. °SEEK IMMEDIATE MEDICAL CARE IF: °· You develop a severe headache or confusion. °·   You have unusual weakness, numbness, or feel faint. °· You have severe chest or abdominal pain. °· You vomit repeatedly. °· You have trouble breathing. °MAKE SURE YOU:  °· Understand these instructions. °· Will watch your condition. °· Will get help right away if you are not doing well or get  worse. °Document Released: 08/20/2005 Document Revised: 01/04/2014 Document Reviewed: 06/12/2013 °Exit[employer]® Patient Information ©2015 [employer]. This information is not intended to replace advice given to you by your health care provider. Make sure you discuss any questions you have with your health care provider. ° °How to Take Your Blood Pressure °HOW DO I GET A BLOOD PRESSURE MACHINE? °· You can buy an electronic home blood pressure machine at your local pharmacy. Insurance will sometimes cover the cost if you have a prescription. °· Ask your doctor what type of machine is best for you. There are different machines for your arm and your wrist. °· If you decide to buy a machine to check your blood pressure on your arm, first check the size of your arm so you can buy the right size cuff. To check the size of your arm:   °¨ Use a measuring tape that shows both inches and centimeters.   °¨ Wrap the measuring tape around the upper-middle part of your arm. You may need someone to help you measure.   °¨ Write down your arm measurement in both inches and centimeters.   °· To measure your blood pressure correctly, it is important to have the right size cuff.   °¨ If your arm is up to 13 inches (up to 34 centimeters), get an adult cuff size. °¨ If your arm is 13 to 17 inches (35 to 44 centimeters), get a large adult cuff size.   °¨  If your arm is 17 to 20 inches (45 to 52 centimeters), get an adult thigh cuff.   °WHAT DO THE NUMBERS MEAN?  °· There are two numbers that make up your blood pressure. For example: 120/80. °¨ The first number (120 in our example) is called the "systolic pressure." It is a measure of the pressure in your blood vessels when your heart is pumping blood. °¨ The second number (80 in our example) is called the "diastolic pressure." It is a measure of the pressure in your blood vessels when your heart is resting between beats. °· Your doctor will tell you what your blood pressure should be. °WHAT  SHOULD I DO BEFORE I CHECK MY BLOOD PRESSURE?  °· Try to rest or relax for at least 30 minutes before you check your blood pressure. °· Do not smoke. °· Do not have any drinks with caffeine, such as: °¨ Soda. °¨ Coffee. °¨ Tea. °· Check your blood pressure in a quiet room. °· Sit down and stretch out your arm on a table. Keep your arm at about the level of your heart. Let your arm relax. °· Make sure that your legs are not crossed. °HOW DO I CHECK MY BLOOD PRESSURE? °· Follow the directions that came with your machine. °· Make sure you remove any tight-fighting clothing from your arm or wrist. Wrap the cuff around your upper arm or wrist. You should be able to fit a finger between the cuff and your arm. If you cannot fit a finger between the cuff and your arm, it is too tight and should be removed and rewrapped. °· Some units require you to manually pump up the arm cuff. °· Automatic units inflate the cuff when you press a   button. °· Cuff deflation is automatic in both models. °· After the cuff is inflated, the unit measures your blood pressure and pulse. The readings are shown on a monitor. Hold still and breathe normally while the cuff is inflated. °· Getting a reading takes less than a minute. °· Some models store readings in a memory. Some provide a printout of readings. If your machine does not store your readings, keep a written record. °· Take readings with you to your next visit with your doctor. °Document Released: 08/02/2008 Document Revised: 01/04/2014 Document Reviewed: 10/15/2013 °Exit[employer]® Patient Information ©2015 [employer]. This information is not intended to replace advice given to you by your health care provider. Make sure you discuss any questions you have with your health care provider. ° ° °

## 2014-07-13 NOTE — ED Notes (Signed)
This RN and Nehemiah SettleBrooke, RN attempted IV access without success.

## 2014-07-13 NOTE — ED Provider Notes (Signed)
CSN: [id number]     Arrival date & time 07/13/14  1123 History   First MD Initiated Contact with Patient 07/13/14 1213     Chief Complaint  Patient presents with  . Hypertension     (Consider location/radiation/quality/duration/timing/severity/associated sxs/prior Treatment) The history is provided by the patient and medical records. No language interpreter was used.     Krista [name] is a 33 y.o. female  with a hx of Primary aldosteronism, HTN, presents to the Emergency Department complaining of gradual, persistent, progressively worsening HTN without somatic symptoms.  Pt reports her first issues with BP were 2008-2009.  She reports she saw her PCP for recheck and was found to be hypertensive.  Velna Hatchet, MD ([facility]), added clonidine to her spironolactone yesterday which she took last night and this AM.  She presented back to the PCP without improvement in the BP and was referred here.  Yesterday 181/110 and today 198/118. NO associated symptoms, no aggravating or alleviating factors.  Pt denies fever, chills, headache, neck pain, chest pain, SOB, abd pain, N/V/D, weakness, dizziness, syncope.     Past Medical History  Diagnosis Date  . Hypokalemia 2007    IP Sept 2012  . Hypertension   . Primary aldosteronism   . Scoliosis   . Aldosteronism 2012  . H/O thalassemia   . H/O varicella   . H/O candidiasis   . Hx: UTI (urinary tract infection) 2010  . Sickle cell trait   . Yeast infection   . H/O bacterial infection   . Dermatitis 2007  . BV (bacterial vaginosis) 2008  . Monilial vaginitis 2008  . Yeast vaginitis 2010  . CIN I (cervical intraepithelial neoplasia I) 2010  . Postpartum hypertension 07/06/10  . Pregnancy induced hypertension 2011  . Abnormal Pap smear 2010    Colpo & LEEP; LAST PAP 09/2011  . Infection     UTI X 1  . Thalassemia minor     CHRONIC  . Migraine headache     OTC  . Migraine    Past Surgical History  Procedure Laterality  Date  . Breast reduction surgery  Jan 2006  . Cesarean section  2005 2011  . Dilation and curettage of uterus  2003  . Wisdom tooth extraction    . Cesarean section with bilateral tubal ligation Bilateral 05/05/2013    Procedure: REPEAT CESAREAN SECTION WITH BILATERAL TUBAL LIGATION,;  Surgeon: Hal Morales, MD;  Location: [facility];  Service: Obstetrics;  Laterality: Bilateral;   Family History  Problem Relation Age of Onset  . Thalassemia Mother   . Arthritis Mother   . Early death Mother 52  . Other Mother     GUILLON-BERRE SX; BLOOD CLOT  . Hypertension Father   . Heart attack Father   . Heart disease Father     MI  . Early death Father   . Kidney disease Father   . Breast cancer Maternal Aunt   . Breast cancer Maternal Grandmother   . Cancer Maternal Grandfather     Bone   History  Substance Use Topics  . Smoking status: Never Smoker   . Smokeless tobacco: Never Used  . Alcohol Use: No     Comment: OCC   OB History    Gravida Para Term Preterm AB TAB SAB Ectopic Multiple Living   7 3 3  0 4 1 3  1 4      Review of Systems  Constitutional: Negative for fever, diaphoresis, appetite change,  fatigue and unexpected weight change.  HENT: Negative for mouth sores.   Eyes: Negative for visual disturbance.  Respiratory: Negative for cough, chest tightness, shortness of breath and wheezing.   Cardiovascular: Negative for chest pain.  Gastrointestinal: Negative for nausea, vomiting, abdominal pain, diarrhea and constipation.  Endocrine: Negative for polydipsia, polyphagia and polyuria.  Genitourinary: Negative for dysuria, urgency, frequency and hematuria.  Musculoskeletal: Negative for back pain and neck stiffness.  Skin: Negative for rash.  Allergic/Immunologic: Negative for immunocompromised state.  Neurological: Negative for syncope, light-headedness and headaches.  Hematological: Does not bruise/bleed easily.  Psychiatric/Behavioral: Negative for sleep disturbance.  The patient is not nervous/anxious.       Allergies  Sulfa antibiotics  Home Medications   Prior to Admission medications   Medication Sig Start Date End Date Taking? Authorizing Provider  cloNIDine (CATAPRES) 0.1 MG tablet Take 0.5 tablets by mouth daily. 07/12/14  Yes Historical Provider, MD  potassium chloride (KLOR-CON) 20 MEQ packet Take 1 packet by mouth daily. 07/12/14  Yes Historical Provider, MD  spironolactone (ALDACTONE) 100 MG tablet Take 2 tablets (200 mg total) by mouth 2 (two) times daily. 05/08/13  Yes Hal MoralesVanessa P Haygood, MD  amLODipine (NORVASC) 10 MG tablet Take 1 tablet (10 mg total) by mouth daily. Patient not taking: Reported on 07/13/2014 08/12/13   Lacie ScottsSarah W Cater, MD  hydrochlorothiazide (HYDRODIURIL) 25 MG tablet Take 1 tablet (25 mg total) by mouth daily. Patient not taking: Reported on 07/13/2014 08/12/13   Lacie ScottsSarah W Cater, MD  metoprolol (LOPRESSOR) 50 MG tablet Take 1 tablet (50 mg total) by mouth 2 (two) times daily. Patient not taking: Reported on 07/13/2014 08/12/13   Lacie ScottsSarah W Cater, MD   BP 178/98 mmHg  Pulse 71  Temp(Src) 98.3 F (36.8 C) (Oral)  Resp 14  Ht 5\' 9"  (1.753 m)  Wt 220 lb (99.791 kg)  BMI 32.47 kg/m2  SpO2 100%  LMP 06/18/2014 Physical Exam  Constitutional: She appears well-developed and well-nourished. No distress.  Awake, alert, nontoxic appearance  HENT:  Head: Normocephalic and atraumatic.  Mouth/Throat: Oropharynx is clear and moist. No oropharyngeal exudate.  Eyes: Conjunctivae are normal. No scleral icterus.  Neck: Normal range of motion. Neck supple.  Cardiovascular: Normal rate, regular rhythm and intact distal pulses.   Murmur (systolic, 2/6) heard. Pulmonary/Chest: Effort normal and breath sounds normal. No respiratory distress. She has no wheezes.  Equal chest expansion  Abdominal: Soft. Bowel sounds are normal. She exhibits no mass. There is no tenderness. There is no rebound and no guarding.  Musculoskeletal: Normal range  of motion. She exhibits no edema.  Neurological: She is alert. She exhibits normal muscle tone. Coordination normal.  Speech is clear and goal oriented Moves extremities without ataxia  Skin: Skin is warm and dry. She is not diaphoretic. No erythema.  Psychiatric: She has a normal mood and affect.  Nursing note and vitals reviewed.   ED Course  Procedures (including critical care time) Labs Review Labs Reviewed  CBC WITH DIFFERENTIAL - Abnormal; Notable for the following:    RBC 5.49 (*)    Hemoglobin 11.4 (*)    HCT 35.7 (*)    MCV 65.0 (*)    MCH 20.8 (*)    All other components within normal limits  BASIC METABOLIC PANEL  PRO B NATRIURETIC PEPTIDE    Imaging Review No results found.   EKG Interpretation   Date/Time:  Tuesday July 13 2014 11:55:52 EST Ventricular Rate:  78 PR Interval:  170 QRS  Duration: 84 QT Interval:  376 QTC Calculation: 428 R Axis:   72 Text Interpretation:  Normal sinus rhythm Normal ECG nonspecific ST/T  waves are resolved Confirmed by GOLDSTON  MD, SCOTT ([id number]) on 07/13/2014  12:18:46 PM      MDM   Final diagnoses:  Hypertension due to endocrine disorder   Krista [name] presents with HTN from her PCP without complaints.  Denies CP, headache, SOB, urinary changes.  Will assess for EOD with bloodwork and repeat home clonopin dosage.    4:37 PM Labs reassuring without laboratory evidence of end organ damage.  Pt with decreasing BP.  Pt to be d/c home with follow-up with PCP tomorrow for discussion of future therapy change  I have personally reviewed patient's vitals, nursing note and any pertinent labs or imaging.  I performed an undressed physical exam.    It has been determined that no acute conditions requiring further emergency intervention are present at this time. The patient/guardian have been advised of the diagnosis and plan. I reviewed all labs and imaging including any potential incidental findings. We have discussed  signs and symptoms that warrant return to the ED and they are listed in the discharge instructions.    BP 178/98 mmHg  Pulse 71  Temp(Src) 98.3 F (36.8 C) (Oral)  Resp 14  Ht 5\' 9"  (1.753 m)  Wt 220 lb (99.791 kg)  BMI 32.47 kg/m2  SpO2 100%  LMP 06/18/2014  The patient was discussed with and seen by Dr. Criss AlvineGoldston who agrees with the treatment plan.    Dahlia ClientHannah Edmar Blankenburg, PA-C 07/13/14 1639  Audree CamelScott T Goldston, MD 07/16/14 [phone]

## 2014-07-13 NOTE — ED Notes (Signed)
Hx. Of hyper-aldosterone hypertension; went to pcp yesterday and was prescribed clonidine; took the clonidine last night and this am, and went back to pcp and bp still elevated.

## 2014-07-13 NOTE — ED Notes (Addendum)
Pt ambulated well to room by triage RN now. Changing in to gown at this time.

## 2014-08-20 ENCOUNTER — Encounter: Payer: Self-pay | Admitting: Internal Medicine

## 2014-08-20 ENCOUNTER — Ambulatory Visit (INDEPENDENT_AMBULATORY_CARE_PROVIDER_SITE_OTHER): Payer: BC Managed Care – PPO | Admitting: Internal Medicine

## 2014-08-20 VITALS — BP 132/80 | HR 88 | Temp 97.8°F | Resp 12 | Ht 67.5 in | Wt 218.0 lb

## 2014-08-20 DIAGNOSIS — E269 Hyperaldosteronism, unspecified: Secondary | ICD-10-CM

## 2014-08-20 DIAGNOSIS — I152 Hypertension secondary to endocrine disorders: Secondary | ICD-10-CM

## 2014-08-20 DIAGNOSIS — E876 Hypokalemia: Secondary | ICD-10-CM

## 2014-08-20 NOTE — Progress Notes (Signed)
Patient ID: Krista [name], female   DOB: 1980-12-27, 33 y.o.   MRN: [id number]   HPI  Krista [name] is a 33 y.o.-year-old female, referred by Dr. Allyne Gee, in consultation for hyperaldosteronism. Previously seen by Dr. Sharl Ma. I do not have his records >> will obtain them.  She was dx with HTN in ~2008 >> she was tried on different meds >> did not work >> referred to Dr Sharl Ma >> she had an abd. CT scan (02/03/2009) >> no adrenal masses >> she was told her condition cannot be managed by surgery >> started Spironolactone >> had to stop for 2 pregnancies. Since 2010 >> ~1 hospitalization a year for low K (1.9) and HTN, despite taking her meds.  She does not check BPs at home.  She is now on: - Spironolactone 200 mg 2x day  She was tried on Clonidine 0.05 mg 1 mo ago >> did not lower BP >> had to go to the hospital for 1 week for BP control.  No HA, no blurry vision with HTN episode.  I reviewed pt's BP levels in last several years >> highest: 206/107, but several values in the high 190s/100s.  I reviewed pertinent lab results, but no aldosterone/PRA results found in Epic >> will ask for records from Dr Sharl Ma. Potassium low (now on supplements prn), no alkalosis. Sodium levels have also been reviewed >> normal.  Component     Latest Ref Rng 10/08/2007 12/28/2008 05/22/2011 05/22/2011 05/22/2011           5:00 AM  1:54 PM  9:38 PM  Potassium     3.7 - 5.3 mEq/L 2.9 (L) 2.8 (L) 2.2 (LL) 2.5 (LL) 2.7 (LL)  Chloride     96 - 112 mEq/L 100 105 103 101 103  CO2     19 - 32 mEq/L 26 31 30 30 29    Component     Latest Ref Rng 05/23/2011 12/05/2011 08/09/2013 08/10/2013 08/10/2013            7:45 AM  6:25 PM  Potassium     3.7 - 5.3 mEq/L 3.9 3.2 (L) 2.1 (LL) 2.5 (LL) 2.9 (L)  Chloride     96 - 112 mEq/L 107 99 97 101 104  CO2     19 - 32 mEq/L 27 26 32 32 29   Component     Latest Ref Rng 08/11/2013 08/11/2013 07/13/2014         4:35 AM  7:00 PM   Potassium     3.7 - 5.3 mEq/L 3.9 3.9 3.7   Chloride     96 - 112 mEq/L 105 99 101  CO2     19 - 32 mEq/L 27 24 25     Lab Results  Component Value Date   TSH 1.292 04/04/2012   TSH 1.820  12/28/2008   TSH 0.971 07/09/2007    Lab Results  Component Value Date   HGBA1C 5.7 12/28/2008   Pt denies: - weight gain - fatigue - cold intolerance - depression - constipation - dry skin - hair falling  She has irregular menses (on Spironolactone).  Her father had high BP >> ESRD and then AMI at 33 y/o.  ROS: Constitutional: no weight gain/loss, no fatigue, no subjective hyperthermia/hypothermia, + poor sleep  Eyes: no blurry vision, no xerophthalmia ENT: no sore throat, no nodules palpated in throat, no dysphagia/odynophagia, no hoarseness Cardiovascular: no CP/SOB/palpitations/leg swelling Respiratory: no cough/SOB Gastrointestinal: no N/V/D/C Musculoskeletal: no muscle/joint aches Skin: no  rashes Neurological: no tremors/numbness/tingling/dizziness Psychiatric: no depression/anxiety + irreg. Menses  Past Medical History  Diagnosis Date  . Hypokalemia 2007    IP Sept 2012  . Hypertension   . Primary aldosteronism   . Scoliosis   . Aldosteronism 2012  . H/O thalassemia   . H/O varicella   . H/O candidiasis   . Hx: UTI (urinary tract infection) 2010  . Sickle cell trait   . Yeast infection   . H/O bacterial infection   . Dermatitis 2007  . BV (bacterial vaginosis) 2008  . Monilial vaginitis 2008  . Yeast vaginitis 2010  . CIN I (cervical intraepithelial neoplasia I) 2010  . Postpartum hypertension 07/06/10  . Pregnancy induced hypertension 2011  . Abnormal Pap smear 2010    Colpo & LEEP; LAST PAP 09/2011  . Infection     UTI X 1  . Thalassemia minor     CHRONIC  . Migraine headache     OTC  . Migraine    Past Surgical History  Procedure Laterality Date  . Breast reduction surgery  Jan 2006  . Cesarean section  2005 2011  . Dilation and curettage of uterus  2003  . Wisdom tooth extraction    .  Cesarean section with bilateral tubal ligation Bilateral 05/05/2013    Procedure: REPEAT CESAREAN SECTION WITH BILATERAL TUBAL LIGATION,;  Surgeon: Hal MoralesVanessa P Haygood, MD;  Location: [facility];  Service: Obstetrics;  Laterality: Bilateral;   History   Social History  . Marital Status: Divorced    Spouse Name: N/A    Number of Children: 4  . Years of Education: 15   Occupational History  . SCHOOL BUS DRIVER [employer]   Social History Main Topics  . Smoking status: Never Smoker   . Smokeless tobacco: Never Used  . Alcohol Use: No     Comment: OCC - beer  . Drug Use: No  . Sexual Activity: Yes    Birth Control/ Protection: None   Other Topics Concern  . Not on file   Social History Narrative   Current Outpatient Prescriptions on File Prior to Visit  Medication Sig Dispense Refill  . spironolactone (ALDACTONE) 100 MG tablet Take 2 tablets (200 mg total) by mouth 2 (two) times daily. 60 tablet 12  . amLODipine (NORVASC) 10 MG tablet Take 1 tablet (10 mg total) by mouth daily. (Patient not taking: Reported on 07/13/2014) 30 tablet 3  . cloNIDine (CATAPRES) 0.1 MG tablet Take 0.5 tablets by mouth daily.    . hydrochlorothiazide (HYDRODIURIL) 25 MG tablet Take 1 tablet (25 mg total) by mouth daily. (Patient not taking: Reported on 07/13/2014) 30 tablet 3  . metoprolol (LOPRESSOR) 50 MG tablet Take 1 tablet (50 mg total) by mouth 2 (two) times daily. (Patient not taking: Reported on 07/13/2014) 60 tablet 3  . potassium chloride (KLOR-CON) 20 MEQ packet Take 1 packet by mouth daily.     No current facility-administered medications on file prior to visit.   Allergies  Allergen Reactions  . Sulfa Antibiotics Other (See Comments)    Eyes turn blood shot red   Family History  Problem Relation Age of Onset  . Thalassemia Mother   . Arthritis Mother   . Early death Mother [id number]  . Other Mother     GUILLON-BERRE SX; BLOOD CLOT  . Hypertension Father   . Heart attack Father    . Heart disease Father     MI  . Early death Father   .  Kidney disease Father   . Breast cancer Maternal Aunt   . Breast cancer Maternal Grandmother   . Cancer Maternal Grandfather     Bone   PE: BP 132/80 mmHg  Pulse 88  Temp(Src) 97.8 F (36.6 C) (Oral)  Resp 12  Ht 5' 7.5" (1.715 m)  Wt 218 lb (98.884 kg)  BMI 33.62 kg/m2  SpO2 99% Wt Readings from Last 3 Encounters:  08/20/14 218 lb (98.884 kg)  07/13/14 220 lb (99.791 kg)  08/12/13 214 lb 15.2 oz (97.5 kg)   Constitutional: obese, in NAD Eyes: PERRLA, EOMI, no exophthalmos ENT: moist mucous membranes, no thyromegaly, no cervical lymphadenopathy Cardiovascular: RRR, No MRG Respiratory: CTA B Gastrointestinal: abdomen soft, NT, ND, BS+ Musculoskeletal: no deformities, strength intact in all 4 Skin: moist, warm, no rashes Neurological: no tremor with outstretched hands, DTR normal in all 4  ASSESSMENT: 1. Hyperaldosteronism  2. Resistant hypertension  3. Hypokalemia  PLAN: 1. And 2. - Patient with a seven-year history of excess aldosterone production, previously seen by Dr. Sharl MaKerr, and diagnosed with primary hyperaldosteronism. I do not have the records of her previous workup, the patient signed a release of information and will fax this to Dr. Sharl MaKerr. I did review the images of her 2010 CT scan of the abdomen, in which the adrenals appear without masses. Based on this, the patient was advised that she has bilateral increased aldosterone production and she was started on spironolactone. Over the years, per review of hospitalization notes, she was intermittently compliant with her spironolactone the regimen, which resulted in spikes of her blood pressure and also instances of decreased potassium. She had to stop spironolactone for her previous pregnancies, last in 2014. As of now, patient tells me she is compliant with her 200 mg twice a day spironolactone. Her latest potassium levels are normal. Her blood pressures are he  usually normal, but they can spike - highest in the 200s over 100. - We discussed about the fact that increase aldosterone production can be primary, from either an aldosterone producing adenoma or from bilateral adrenal hyperaldosteronism. If she has an aldosterone producing adenoma, the treatment is surgery. If she has bilateral adrenal hyperaldosteronism, the treatment is spironolactone. In her case, since her blood pressure is not fully controlled on the spironolactone, I would suggest to add Norvasc to limit the blood pressure excursions. However, I would like to review the labs from Dr. Sharl MaKerr first and then decide whether she needs further investigation before starting her on Norvasc. She agrees with the plan. - We also discussed about the fact that high aldosterone can be a consequence of another condition, for example renal arterial ds. or the rare familial glucocorticoid remediable hyperaldosteronism.  -  we discussed that If she has primary hyperaldosteronism (screening and confirmatory test positive), I believe it is worth trying to check for adrenal lateralization of aldosterone production, by doing an adrenal venous sampling. However, this is done off spironolactone. She is interested in surgery in case the production is lateralized to one side of her adrenal. - If we need to take her off spironolactone, we can substitute with amiloride, diltiazem, doxazosin. It would be difficult to control blood pressure on this, so the target would be to be off spironolactone for the shortest time possible. She agrees to this, if needed.  - I would not check any labs today, but will likely need to do this after we obtain the records - For now, continue spironolactone 200 mg twice a day.  I explained that her irregular menstrual cycles are likely from the spironolactone   3.  hypokalemia  - Reviewed potassium records along with the patient. It appears that spironolactone at the current dose is enough to  maintain the potassium concentration  - continue off potassium supplements for now, but have a low threshold to restart them  - time spent with the patient: 1 hour, of which >50% was spent in obtaining information about her symptoms, reviewing her previous labs, evaluations, and treatments, counseling her about her condition (please see the discussed topics above), and developing a plan to further investigate it. she had a number of questions which I addressed.  10/07/2014 Received and reviewed records from Dr. Sharl Ma: - Patient's aldosterone was elevated at 32.4 (1-16) while plasma reading activity was suppressed at <0.15 on 01/04/2009. A concomitant potassium was normal, at 4.0. At that time her GFR was 145. Potassium was continued at the previous dose. Dr. Sharl Ma also had her increase her sodium intake to at least 5000 mg per day for 3 days, as the end of which she performed a 24-hour urine collection which showed a urine sodium of 56 (unfortunately not higher than 200), with an aldosterone of 39. Of note, a cortisol ran on the same collection was normal, at 13. Urinary creatinine was normal at 102.6.  I called the patient and explained that we will need to repeat the aldosterone and PRA first, then see if he needs to do a saline suppression test and then maybe an adrenal vein sampling. She did have a CAT scan of her abdomen in 2010 that did not show adrenal masses, however, this is not completely ruling out a possible small primary aldosterone producing tumor.  To perform the above tests, will need to take her off spironolactone, which is risky, considering her previous uncontrolled hypertension on other medicines. I advised her to try to check her blood pressure at home after we stopped the spironolactone, and we also will have to have her back for a nurse visit in a week. At that time, we'll also check her potassium level. For now, I advised her to continue potassium 20 mEq daily. Instead of  spironolactone, will start prazosin 5 mg daily and diltiazem 60 mg 4 times daily. She will return in 3 weeks for potassium, aldosterone, PRA.

## 2014-08-20 NOTE — Patient Instructions (Signed)
Please come back for another visit in 3 months.  Continue Spironolactone 200 mg 2x a day until then.  Please try to check blood pressure at home and write it down.  I will review the records from Dr. Sharl [name] and will let you know about the plan of action.  Please try to join MyChart for easier communication.  Hypertension Hypertension, commonly called high blood pressure, is when the force of blood pumping through your arteries is too strong. Your arteries are the blood vessels that carry blood from your heart throughout your body. A blood pressure reading consists of a higher number over a lower number, such as 110/72. The higher number (systolic) is the pressure inside your arteries when your heart pumps. The lower number (diastolic) is the pressure inside your arteries when your heart relaxes. Ideally you want your blood pressure below 120/80. Hypertension forces your heart to work harder to pump blood. Your arteries may become narrow or stiff. Having hypertension puts you at risk for heart disease, stroke, and other problems.  RISK FACTORS Some risk factors for high blood pressure are controllable. Others are not.  Risk factors you cannot control include:   Race. You may be at higher risk if you are African American.  Age. Risk increases with age.  Gender. Men are at higher risk than women before age 33 years. After age 33, women are at higher risk than men. Risk factors you can control include:  Not getting enough exercise or physical activity.  Being overweight.  Getting too much fat, sugar, calories, or salt in your diet.  Drinking too much alcohol. SIGNS AND SYMPTOMS Hypertension does not usually cause signs or symptoms. Extremely high blood pressure (hypertensive crisis) may cause headache, anxiety, shortness of breath, and nosebleed. DIAGNOSIS  To check if you have hypertension, your health care provider will measure your blood pressure while you are seated, with your arm  held at the level of your heart. It should be measured at least twice using the same arm. Certain conditions can cause a difference in blood pressure between your right and left arms. A blood pressure reading that is higher than normal on one occasion does not mean that you need treatment. If one blood pressure reading is high, ask your health care provider about having it checked again. TREATMENT  Treating high blood pressure includes making lifestyle changes and possibly taking medicine. Living a healthy lifestyle can help lower high blood pressure. You may need to change some of your habits. Lifestyle changes may include:  Following the DASH diet. This diet is high in fruits, vegetables, and whole grains. It is low in salt, red meat, and added sugars.  Getting at least 2 hours of brisk physical activity every week.  Losing weight if necessary.  Not smoking.  Limiting alcoholic beverages.  Learning ways to reduce stress. If lifestyle changes are not enough to get your blood pressure under control, your health care provider may prescribe medicine. You may need to take more than one. Work closely with your health care provider to understand the risks and benefits. HOME CARE INSTRUCTIONS  Have your blood pressure rechecked as directed by your health care provider.   Take medicines only as directed by your health care provider. Follow the directions carefully. Blood pressure medicines must be taken as prescribed. The medicine does not work as well when you skip doses. Skipping doses also puts you at risk for problems.   Do not smoke.   Monitor your blood  pressure at home as directed by your health care provider. SEEK MEDICAL CARE IF:   You think you are having a reaction to medicines taken.  You have recurrent headaches or feel dizzy.  You have swelling in your ankles.  You have trouble with your vision. SEEK IMMEDIATE MEDICAL CARE IF:  You develop a severe headache or  confusion.  You have unusual weakness, numbness, or feel faint.  You have severe chest or abdominal pain.  You vomit repeatedly.  You have trouble breathing. MAKE SURE YOU:   Understand these instructions.  Will watch your condition.  Will get help right away if you are not doing well or get worse. Document Released: 08/20/2005 Document Revised: 01/04/2014 Document Reviewed: 06/12/2013 [facility] Patient Information 2015 [place], [place]. This information is not intended to replace advice given to you by your health care provider. Make sure you discuss any questions you have with your health care provider.

## 2014-09-11 ENCOUNTER — Encounter (HOSPITAL_COMMUNITY): Payer: Self-pay | Admitting: *Deleted

## 2014-09-11 ENCOUNTER — Emergency Department (HOSPITAL_COMMUNITY)
Admission: EM | Admit: 2014-09-11 | Discharge: 2014-09-11 | Disposition: A | Discharge status: Home / Self Care | Payer: BLUE CROSS/BLUE SHIELD | Source: Home / Self Care | Attending: Emergency Medicine | Admitting: Emergency Medicine

## 2014-09-11 DIAGNOSIS — N760 Acute vaginitis: Secondary | ICD-10-CM

## 2014-09-11 DIAGNOSIS — B9689 Other specified bacterial agents as the cause of diseases classified elsewhere: Secondary | ICD-10-CM

## 2014-09-11 DIAGNOSIS — T50995A Adverse effect of other drugs, medicaments and biological substances, initial encounter: Secondary | ICD-10-CM

## 2014-09-11 DIAGNOSIS — T7840XA Allergy, unspecified, initial encounter: Secondary | ICD-10-CM

## 2014-09-11 MED ORDER — METHYLPREDNISOLONE ACETATE PF 80 MG/ML IJ SUSP
80.0000 mg | Freq: Once | INTRAMUSCULAR | Status: AC
  Administered 2014-09-11: 80 mg via INTRAMUSCULAR

## 2014-09-11 MED ORDER — METRONIDAZOLE 0.75 % VA GEL: 1.0000 | Freq: Every day | VAGINAL | Status: AC

## 2014-09-11 MED ORDER — METHYLPREDNISOLONE ACETATE 80 MG/ML IJ SUSP
INTRAMUSCULAR | Status: AC
  Filled 2014-09-11: qty 1

## 2014-09-11 NOTE — ED Provider Notes (Signed)
CSN: [id number]     Arrival date & time 09/11/14  0909 History   First MD Initiated Contact with Patient 09/11/14 [id number]     Chief Complaint  Patient [name] with  . Allergic Reaction   (Consider location/radiation/quality/duration/timing/severity/associated sxs/prior Treatment) HPI Comments: Krista [name] with lip swelling and "burning" that began this morning at 2am. She was given both Flagyl and Diclofenac yesterday for BV and candida. She has taken both of these before. The swelling began at 2am. She was also noted to have tried her daughter's chap stick, which in fact has done this in the past; though not same chap stick. She denies SOB, Rash or wheezing.   Patient is a 34 y.o. female presenting with allergic reaction. The history is provided by the patient.  Allergic Reaction Presenting symptoms: no rash and no wheezing     Past Medical History  Diagnosis Date  . Hypokalemia 2007    IP Sept 2012  . Hypertension   . Primary aldosteronism   . Scoliosis   . Aldosteronism 2012  . H/O thalassemia   . H/O varicella   . H/O candidiasis   . Hx: UTI (urinary tract infection) 2010  . Sickle cell trait   . Yeast infection   . H/O bacterial infection   . Dermatitis 2007  . BV (bacterial vaginosis) 2008  . Monilial vaginitis 2008  . Yeast vaginitis 2010  . CIN I (cervical intraepithelial neoplasia I) 2010  . Postpartum hypertension 07/06/10  . Pregnancy induced hypertension 2011  . Abnormal Pap smear 2010    Colpo & LEEP; LAST PAP 09/2011  . Infection     UTI X 1  . Thalassemia minor     CHRONIC  . Migraine headache     OTC  . Migraine    Past Surgical History  Procedure Laterality Date  . Breast reduction surgery  Jan 2006  . Cesarean section  2005 2011  . Dilation and curettage of uterus  2003  . Wisdom tooth extraction    . Cesarean section with bilateral tubal ligation Bilateral 05/05/2013    Procedure: REPEAT CESAREAN SECTION WITH BILATERAL TUBAL LIGATION,;   Surgeon: Hal Morales, MD;  Location: [facility];  Service: Obstetrics;  Laterality: Bilateral;   Family History  Problem Relation Age of Onset  . Thalassemia Mother   . Arthritis Mother   . Early death Mother 74  . Other Mother     GUILLON-BERRE SX; BLOOD CLOT  . Hypertension Father   . Heart attack Father   . Heart disease Father     MI  . Early death Father   . Kidney disease Father   . Breast cancer Maternal Aunt   . Breast cancer Maternal Grandmother   . Cancer Maternal Grandfather     Bone   History  Substance Use Topics  . Smoking status: Never Smoker   . Smokeless tobacco: Never Used  . Alcohol Use: No     Comment: OCC   OB History    Gravida Para Term Preterm AB TAB SAB Ectopic Multiple Living   0 Review of Systems  Constitutional: Negative for activity change.  HENT: Negative.   Eyes: Negative.  Negative for itching.  Respiratory: Negative for cough, choking, shortness of breath, wheezing and stridor.   Cardiovascular: Negative.   Musculoskeletal: Negative.   Skin: Negative for color change and rash.  Allergic/Immunologic: Negative for environmental allergies  and food allergies.  Neurological: Negative.   Psychiatric/Behavioral: Negative.     Allergies  Sulfa antibiotics  Home Medications   Prior to Admission medications   Medication Sig Start Date End Date Taking? Authorizing Provider  amLODipine (NORVASC) 10 MG tablet Take 1 tablet (10 mg total) by mouth daily. Patient not taking: Reported on 07/13/2014 08/12/13   Lacie ScottsSarah W Cater, MD  cloNIDine (CATAPRES) 0.1 MG tablet Take 0.5 tablets by mouth daily. 07/12/14   Historical Provider, MD  hydrochlorothiazide (HYDRODIURIL) 25 MG tablet Take 1 tablet (25 mg total) by mouth daily. Patient not taking: Reported on 07/13/2014 08/12/13   Lacie ScottsSarah W Cater, MD  metoprolol (LOPRESSOR) 50 MG tablet Take 1 tablet (50 mg total) by mouth 2 (two) times daily. Patient not taking: Reported on  07/13/2014 08/12/13   Lacie ScottsSarah W Cater, MD  metroNIDAZOLE (METROGEL VAGINAL) 0.75 % vaginal gel Place 1 Applicatorful vaginally at bedtime. 09/11/14 09/15/14  Riki SheerMichelle G Svea Pusch, PA-C  potassium chloride (KLOR-CON) 20 MEQ packet Take 1 packet by mouth daily. 07/12/14   Historical Provider, MD  spironolactone (ALDACTONE) 100 MG tablet Take 2 tablets (200 mg total) by mouth 2 (two) times daily. 05/08/13   Hal MoralesVanessa P Haygood, MD   BP 158/84 mmHg  Pulse 84  Temp(Src) 98.8 F (37.1 C) (Oral)  Resp 18  SpO2 98% Physical Exam  ED Course  Procedures (including critical care time) Labs Review Labs Reviewed - No data to display  Imaging Review No results found.   MDM   1. Allergic reaction caused by a drug   2. BV (bacterial vaginosis)    No anaphylaxis. Difficult to know etiology, though suspect the lip gloss as she has taken both medications before. Will stop orals, and change to Metrogel. Also gave Depo 80mg  IM and instructed to use Claritin daily for the next 2-3 days. F/u if worsens.     Riki SheerMichelle G Dierre Crevier, PA-C 09/11/14 1002

## 2014-09-11 NOTE — Discharge Instructions (Signed)
Angioedema °Angioedema is a sudden swelling of tissues, often of the skin. It can occur on the face or genitals or in the abdomen or other body parts. The swelling usually develops over a short period and gets better in 24 to 48 hours. It often begins during the night and is found when the person wakes up. The person may also get red, itchy patches of skin (hives). Angioedema can be dangerous if it involves swelling of the air passages.  °Depending on the cause, episodes of angioedema may only happen once, come back in unpredictable patterns, or repeat for several years and then gradually fade away.  °CAUSES  °Angioedema can be caused by an allergic reaction to various triggers. It can also result from nonallergic causes, including reactions to drugs, immune system disorders, viral infections, or an abnormal gene that is passed to you from your parents (hereditary). For some people with angioedema, the cause is unknown.  °Some things that can trigger angioedema include:  °· Foods.   °· Medicines, such as ACE inhibitors, ARBs, nonsteroidal anti-inflammatory agents, or estrogen.   °· Latex.   °· Animal saliva.   °· Insect stings.   °· Dyes used in X-rays.   °· Mild injury.   °· Dental work. °· Surgery. °· Stress.   °· Sudden changes in temperature.   °· Exercise. °SIGNS AND SYMPTOMS  °· Swelling of the skin. °· Hives. If these are present, there is also intense itching. °· Redness in the affected area.   °· Pain in the affected area. °· Swollen lips or tongue. °· Breathing problems. This may happen if the air passages swell. °· Wheezing. °If internal organs are involved, there may be:  °· Nausea.   °· Abdominal pain.   °· Vomiting.   °· Difficulty swallowing.   °· Difficulty passing urine. °DIAGNOSIS  °· Your health care provider will examine the affected area and take a medical and family history. °· Various tests may be done to help determine the cause. Tests may include: °¨ Allergy skin tests to see if the problem  is an allergic reaction.   °¨ Blood tests to check for hereditary angioedema.   °¨ Tests to check for underlying diseases that could cause the condition.   °· A review of your medicines, including over-the-counter medicines, may be done. °TREATMENT  °Treatment will depend on the cause of the angioedema. Possible treatments include:  °· Removal of anything that triggered the condition (such as stopping certain medicines).   °· Medicines to treat symptoms or prevent attacks. Medicines given may include:   °¨ Antihistamines.   °¨ Epinephrine injection.   °¨ Steroids.   °· Hospitalization may be required for severe attacks. If the air passages are affected, it can be an emergency. Tubes may need to be placed to keep the airway open. °HOME CARE INSTRUCTIONS  °· Take all medicines as directed by your health care provider. °· If you were given medicines for emergency allergy treatment, always carry them with you. °· Wear a medical bracelet as directed by your health care provider.   °· Avoid known triggers. °SEEK MEDICAL CARE IF:  °· You have repeat attacks of angioedema.   °· Your attacks are more frequent or more severe despite preventive measures.   °· You have hereditary angioedema and are considering having children. It is important to discuss with your health care provider the risks of passing the condition on to your children. °SEEK IMMEDIATE MEDICAL CARE IF:  °· You have severe swelling of the mouth, tongue, or lips. °· You have difficulty breathing.   °· You have difficulty swallowing.   °· You faint. °MAKE   SURE YOU:  Understand these instructions.  Will watch your condition.  Will get help right away if you are not doing well or get worse. Document Released: 10/29/2001 Document Revised: 01/04/2014 Document Reviewed: 04/13/2013 [facility] Patient Information 2015 [place], [place]. This information is not intended to replace advice given to you by your health care provider. Make sure you discuss any questions  you have with your health care provider.    Start Claritin daily for 2-3 days. Injection should help by the morning. If any worsening call. Change to vaginal cream x 5 days.

## 2014-09-11 NOTE — ED Notes (Signed)
Pt  Woke  Up   With  Lips  Swelling     She  Started  Diflucan  And  Flagyl  Yesterday

## 2014-09-15 ENCOUNTER — Encounter: Payer: Self-pay | Admitting: Internal Medicine

## 2014-09-27 IMAGING — US US OB TRANSVAGINAL
1 series · 14 of 28 positions shown · non-contrast
Comparison: None.

CLINICAL DATA: Pregnancy.  Estimated gestational age by LMP is 4
weeks 5 days.  Hypertension.

OBSTETRIC <14 WK US AND TRANSVAGINAL OB US
TECHNIQUE: Both transabdominal and transvaginal ultrasound
examinations were performed for complete evaluation of the
gestation as well as the maternal uterus, adnexal regions, and
pelvic cul-de-sac.  Transvaginal technique was performed to assess
early pregnancy.

[Series 1: us ob transvaginal · 14 of 43 slices shown]
[im 2/43]
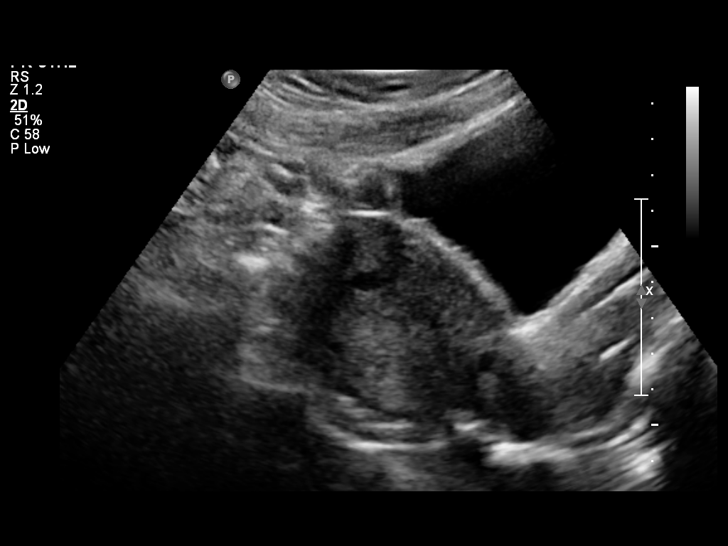
[im 5/43]
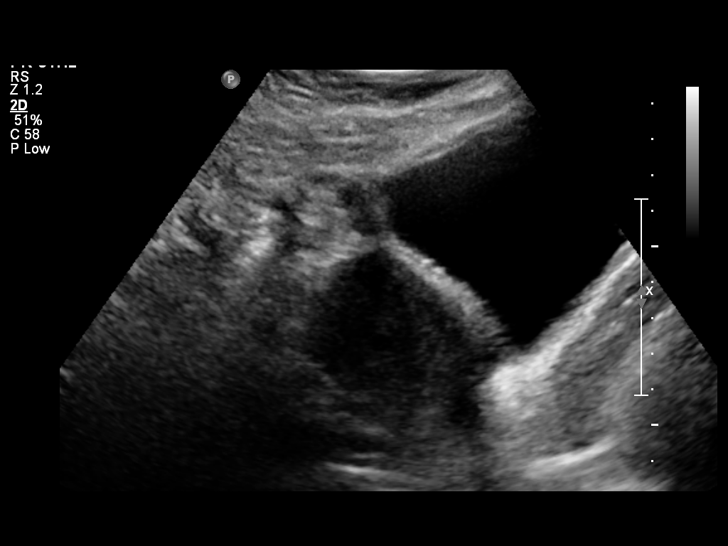
[im 8/43]
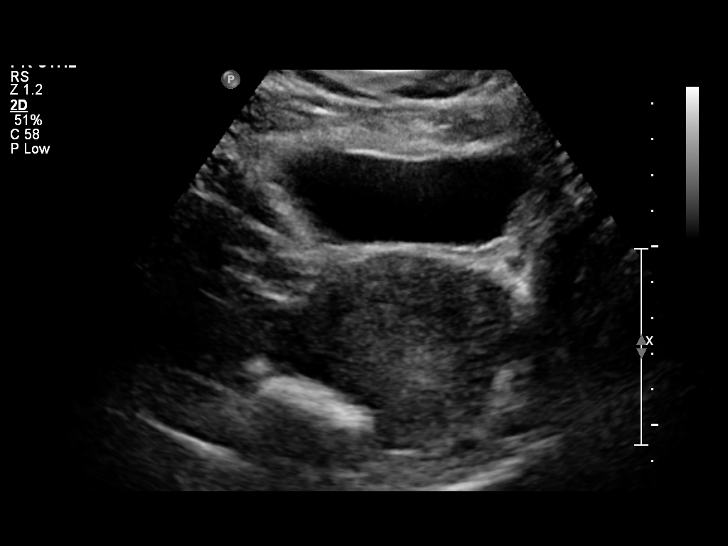
[im 11/43]
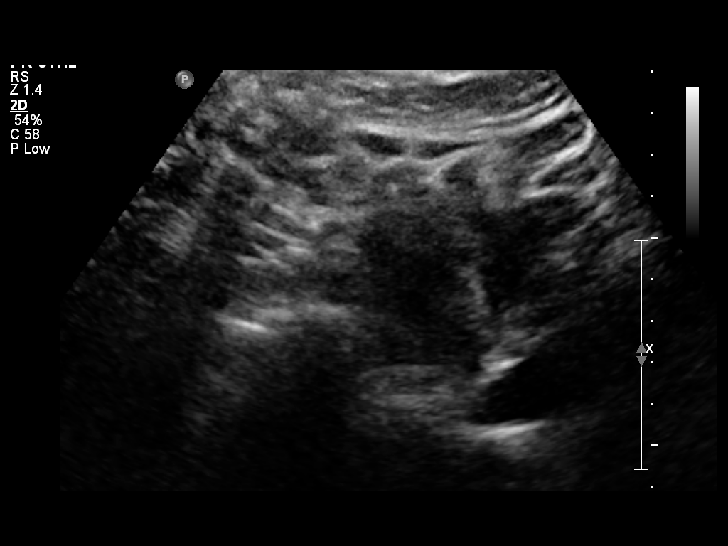
[im 15/43]
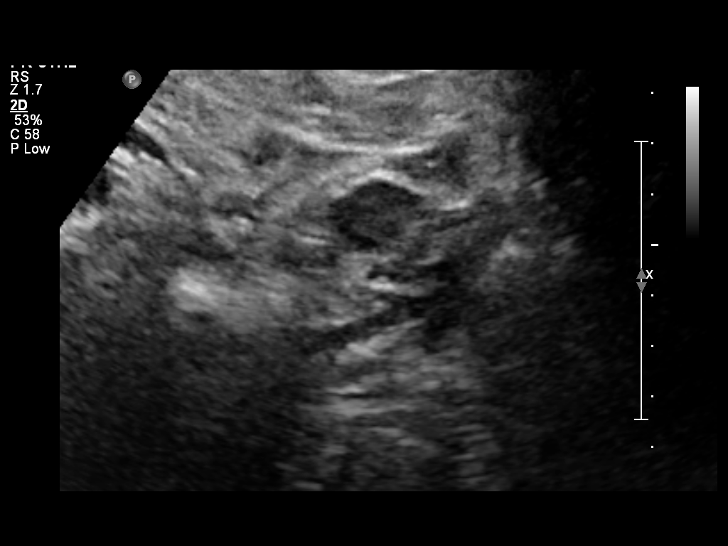
[im 18/43]
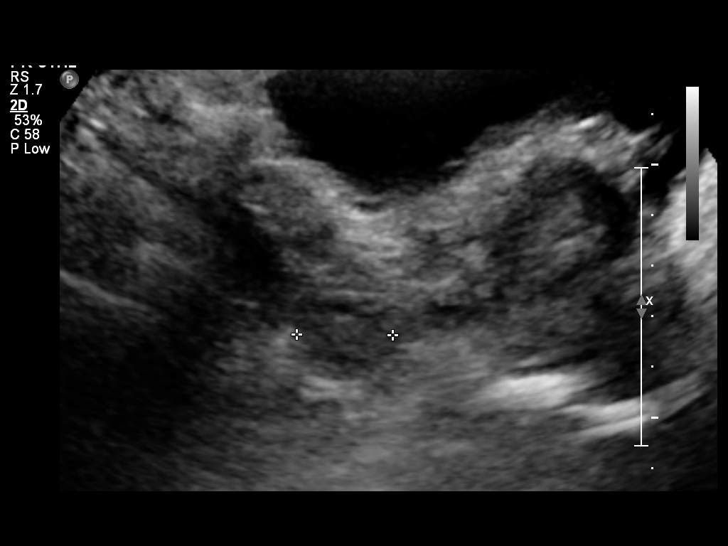
[im 21/43]
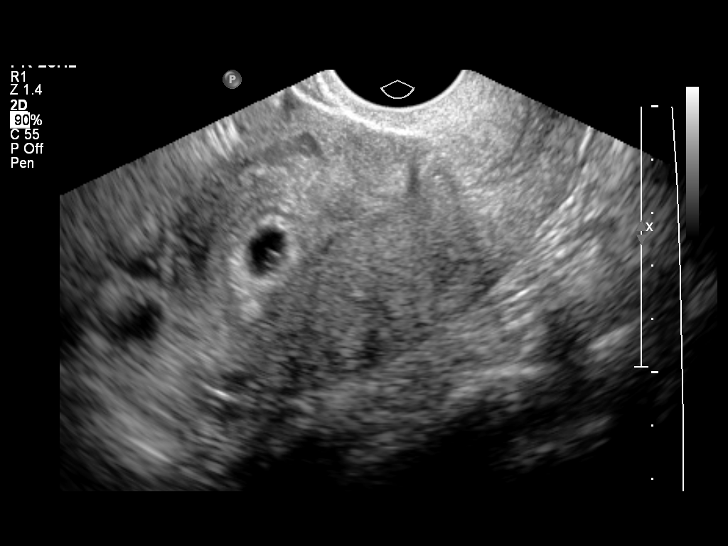
[im 24/43]
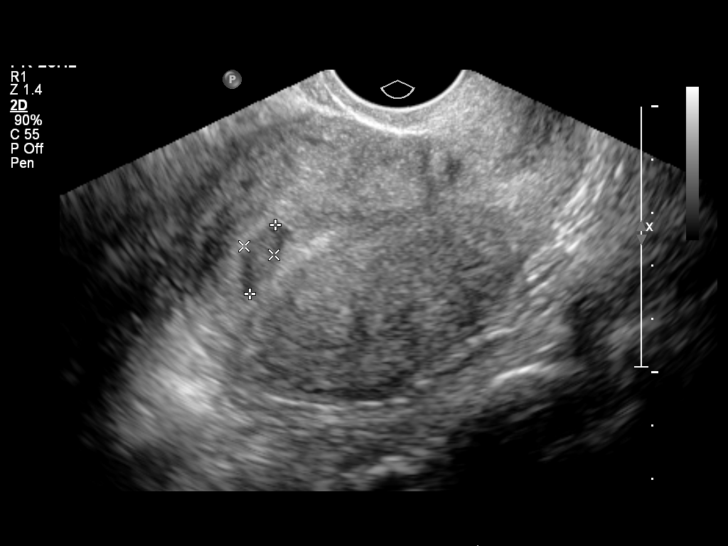
[im 27/43]
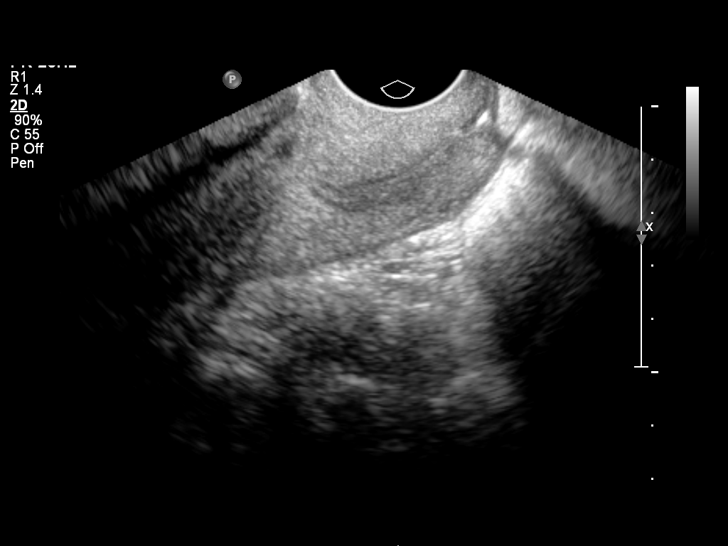
[im 30/43]
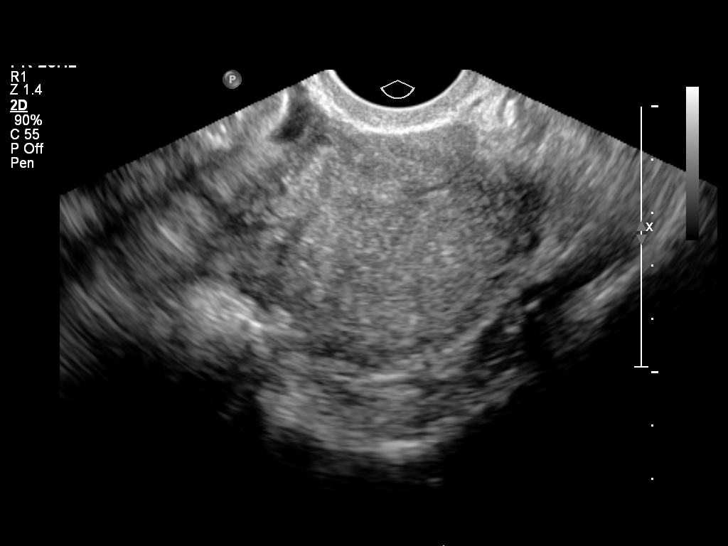
[im 33/43]
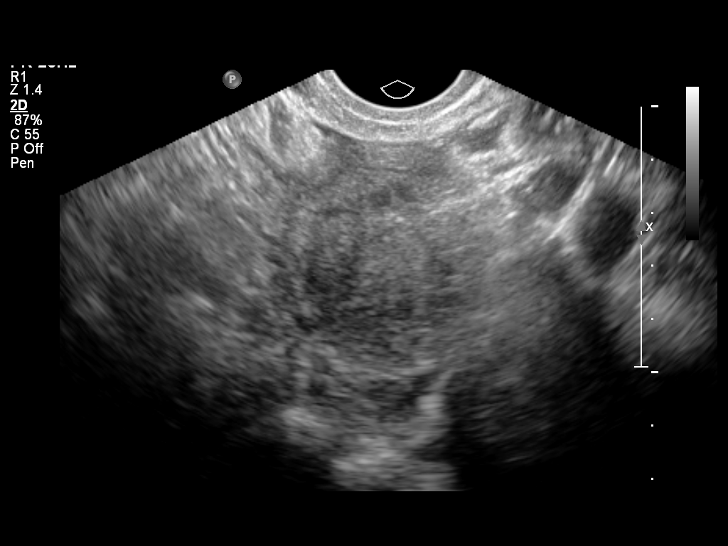
[im 36/43]
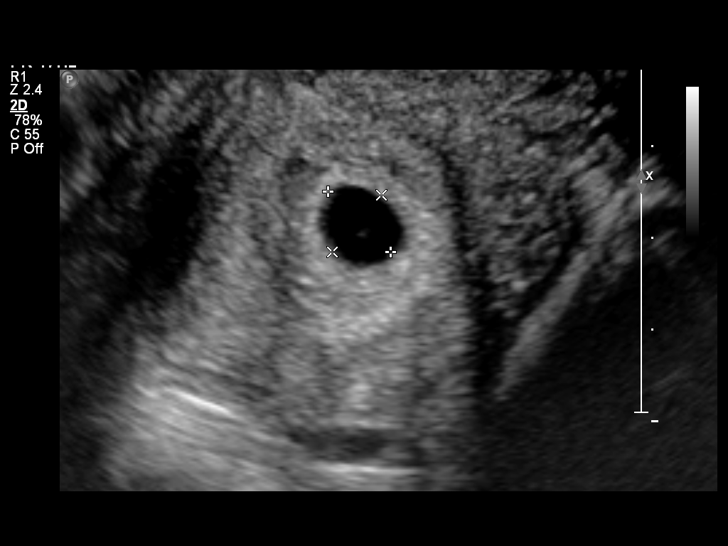
[im 39/43]
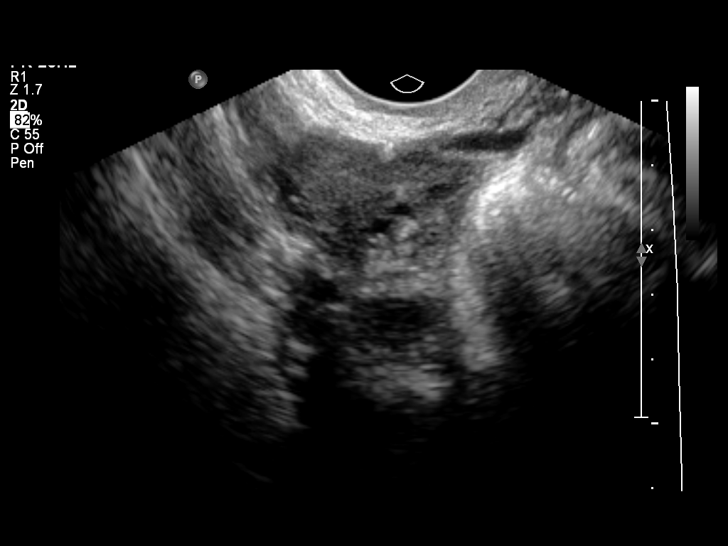
[im 43/43]
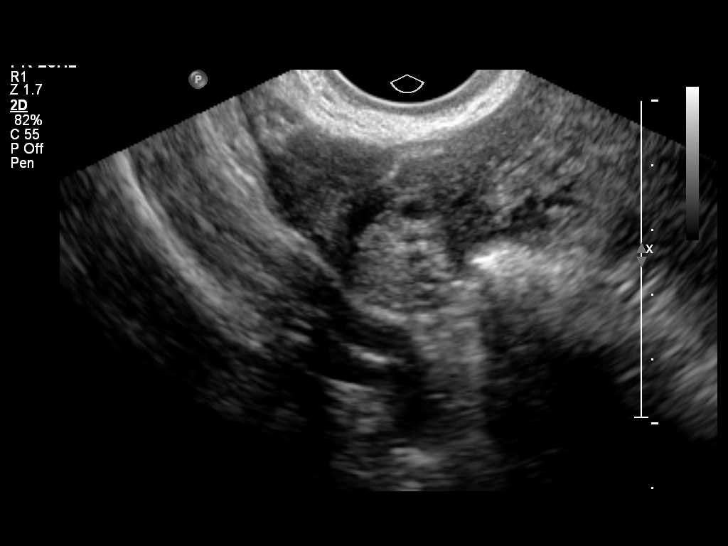

[14 of 28 positions shown; findings below may reference images not displayed]

Intrauterine gestational sac:  Visualized/normal in shape.
Yolk sac: Visualized
Embryo: Not visualized

MSD: 8.4 mm  5 w 3 d

Maternal uterus/adnexae:
There is a small subchorionic hemorrhage.  It measures 1.4 x 0.6 x
0.9 cm.  Both maternal ovaries have normal appearances.  The left
ovary is best seen transabdominally.  No free pelvic fluid.
IMPRESSION: 1.  Intrauterine pregnancy.  An intrauterine gestational sac and
yolk sac are visible at this time.  No embryo is seen, but not
unexpected given the small mean sac diameter.
2.  Small subchorionic hemorrhage.

Ultrasound for fetal anatomic evaluation at 18 to 19 weeks
gestational age is recommended, and earlier as clinically
indicated.

## 2014-10-07 MED ORDER — DILTIAZEM HCL 60 MG PO TABS: 60.0000 mg | ORAL_TABLET | Freq: Four times a day (QID) | ORAL | Status: DC

## 2014-10-07 MED ORDER — PRAZOSIN HCL 5 MG PO CAPS: 5.0000 mg | ORAL_CAPSULE | Freq: Every day | ORAL | Status: DC

## 2014-11-19 ENCOUNTER — Ambulatory Visit: Payer: BC Managed Care – PPO | Admitting: Internal Medicine

## 2014-12-21 ENCOUNTER — Ambulatory Visit: Payer: BLUE CROSS/BLUE SHIELD | Admitting: Internal Medicine

## 2014-12-21 DIAGNOSIS — Z0289 Encounter for other administrative examinations: Secondary | ICD-10-CM

## 2015-01-17 ENCOUNTER — Encounter: Payer: Self-pay | Admitting: Internal Medicine

## 2015-01-19 ENCOUNTER — Telehealth: Payer: Self-pay | Admitting: Internal Medicine

## 2015-01-19 NOTE — Telephone Encounter (Signed)
[facility] by Carlus Pavlovristina Gherghe, M.D. effective Jan 17, 2015. Dismissal letter sent out by certified / registered mail on Jan 19, 2015. [facility]

## 2015-02-17 NOTE — Telephone Encounter (Signed)
Certified dismissal letter returned on February 17, 2015 as undeliverable, unclaimed, return to sender after three attempts by [employer]. Letter placed in another envelope on February 17, 2015 and resent as 1st class mail which does not require a signature. [facility]

## 2015-03-07 ENCOUNTER — Emergency Department (HOSPITAL_COMMUNITY)
Admission: EM | Admit: 2015-03-07 | Discharge: 2015-03-07 | Disposition: A | Discharge status: Home / Self Care | Payer: BC Managed Care – PPO | Attending: Emergency Medicine | Admitting: Emergency Medicine

## 2015-03-07 ENCOUNTER — Emergency Department (HOSPITAL_COMMUNITY): Admission: EM | Admit: 2015-03-07 | Discharge: 2015-03-07 | Discharge status: Absent without leave | Payer: Self-pay

## 2015-03-07 ENCOUNTER — Encounter (HOSPITAL_COMMUNITY): Payer: Self-pay

## 2015-03-07 DIAGNOSIS — Z87440 Personal history of urinary (tract) infections: Secondary | ICD-10-CM | POA: Diagnosis not present

## 2015-03-07 DIAGNOSIS — I10 Essential (primary) hypertension: Secondary | ICD-10-CM | POA: Insufficient documentation

## 2015-03-07 DIAGNOSIS — Z8739 Personal history of other diseases of the musculoskeletal system and connective tissue: Secondary | ICD-10-CM | POA: Insufficient documentation

## 2015-03-07 DIAGNOSIS — T783XXA Angioneurotic edema, initial encounter: Secondary | ICD-10-CM | POA: Insufficient documentation

## 2015-03-07 DIAGNOSIS — T7840XA Allergy, unspecified, initial encounter: Secondary | ICD-10-CM

## 2015-03-07 DIAGNOSIS — Z79899 Other long term (current) drug therapy: Secondary | ICD-10-CM | POA: Diagnosis not present

## 2015-03-07 DIAGNOSIS — G43909 Migraine, unspecified, not intractable, without status migrainosus: Secondary | ICD-10-CM | POA: Diagnosis not present

## 2015-03-07 DIAGNOSIS — T378X5A Adverse effect of other specified systemic anti-infectives and antiparasitics, initial encounter: Secondary | ICD-10-CM | POA: Diagnosis not present

## 2015-03-07 DIAGNOSIS — X58XXXA Exposure to other specified factors, initial encounter: Secondary | ICD-10-CM | POA: Diagnosis not present

## 2015-03-07 DIAGNOSIS — R220 Localized swelling, mass and lump, head: Secondary | ICD-10-CM

## 2015-03-07 DIAGNOSIS — Y998 Other external cause status: Secondary | ICD-10-CM | POA: Diagnosis not present

## 2015-03-07 DIAGNOSIS — T373X5A Adverse effect of other antiprotozoal drugs, initial encounter: Secondary | ICD-10-CM | POA: Insufficient documentation

## 2015-03-07 DIAGNOSIS — Z8619 Personal history of other infectious and parasitic diseases: Secondary | ICD-10-CM | POA: Diagnosis not present

## 2015-03-07 DIAGNOSIS — Z862 Personal history of diseases of the blood and blood-forming organs and certain disorders involving the immune mechanism: Secondary | ICD-10-CM | POA: Insufficient documentation

## 2015-03-07 DIAGNOSIS — Y9289 Other specified places as the place of occurrence of the external cause: Secondary | ICD-10-CM | POA: Diagnosis not present

## 2015-03-07 DIAGNOSIS — Y9389 Activity, other specified: Secondary | ICD-10-CM | POA: Insufficient documentation

## 2015-03-07 DIAGNOSIS — Z8742 Personal history of other diseases of the female genital tract: Secondary | ICD-10-CM | POA: Insufficient documentation

## 2015-03-07 MED ORDER — FAMOTIDINE 20 MG PO TABS
40.0000 mg | ORAL_TABLET | Freq: Once | ORAL | Status: AC
  Administered 2015-03-07: 40 mg via ORAL
  Filled 2015-03-07: qty 2

## 2015-03-07 MED ORDER — PREDNISONE 20 MG PO TABS: ORAL_TABLET | ORAL | Status: DC

## 2015-03-07 MED ORDER — DIPHENHYDRAMINE HCL 25 MG PO TABS: 50.0000 mg | ORAL_TABLET | Freq: Four times a day (QID) | ORAL | Status: DC

## 2015-03-07 MED ORDER — PREDNISONE 20 MG PO TABS
60.0000 mg | ORAL_TABLET | Freq: Once | ORAL | Status: AC
  Administered 2015-03-07: 60 mg via ORAL
  Filled 2015-03-07: qty 3

## 2015-03-07 MED ORDER — DIPHENHYDRAMINE HCL 25 MG PO CAPS
50.0000 mg | ORAL_CAPSULE | Freq: Once | ORAL | Status: AC
  Administered 2015-03-07: 50 mg via ORAL
  Filled 2015-03-07: qty 2

## 2015-03-07 MED ORDER — RANITIDINE HCL 150 MG PO TABS: 150.0000 mg | ORAL_TABLET | Freq: Two times a day (BID) | ORAL | Status: DC

## 2015-03-07 MED ORDER — METRONIDAZOLE 0.75 % VA GEL: 1.0000 | Freq: Every day | VAGINAL | Status: DC

## 2015-03-07 NOTE — ED Provider Notes (Signed)
CSN: [id number]     Arrival date & time 03/07/15  1031 History   First MD Initiated Contact with Patient 03/07/15 1047     Chief Complaint  Patient presents with  . Allergic Reaction     (Consider location/radiation/quality/duration/timing/severity/associated sxs/prior Treatment) HPI Comments: Krista [name] is a 34 y.o. female with a PMHx of HTN, primary aldosteronism, scoliosis, hypokalemia, thalassemia, recurrent UTIs and candidiasis, sickle cell trait, recurrent BV, CIN1, and recurrent migraines, who presents to the ED with complaints of allergic reaction with symptoms of lip swelling that began yesterday afternoon and has been unchanged after taking Benadryl. She reports that she started Flagyl and fluconazole yesterday just prior to onset of symptoms. She reports a similar episode in January that she believed was due to look loss, but at that time she was also taking Flagyl and diclofenac. She is no other changes in lip care, perfumes, or medications today. She reports mild 5/10 sore pain in her lips which is constant nonradiating worse with pressure and unrelieved with Benadryl. She denies any fevers, chills, drooling, difficulty swallowing, trismus, tongue swelling, periorbital swelling, chest pain, shortness breath, wheezing, stridor, abdominal pain, nausea, vomiting, diarrhea, dysuria, hematuria, numbness, tingling, weakness, headaches, or rashes. She has not taken her blood pressure medications this morning.  Patient is a 34 y.o. female presenting with allergic reaction. The history is provided by the patient. No language interpreter was used.  Allergic Reaction Presenting symptoms: swelling   Presenting symptoms: no difficulty breathing, no difficulty swallowing, no rash and no wheezing   Severity:  Mild Prior allergic episodes:  Allergies to medications Context: medications   Relieved by:  Nothing Worsened by:  Nothing tried Ineffective treatments:  Antihistamines   Past Medical  History  Diagnosis Date  . Hypokalemia 2007    IP Sept 2012  . Hypertension   . Primary aldosteronism   . Scoliosis   . Aldosteronism 2012  . H/O thalassemia   . H/O varicella   . H/O candidiasis   . Hx: UTI (urinary tract infection) 2010  . Sickle cell trait   . Yeast infection   . H/O bacterial infection   . Dermatitis 2007  . BV (bacterial vaginosis) 2008  . Monilial vaginitis 2008  . Yeast vaginitis 2010  . CIN I (cervical intraepithelial neoplasia I) 2010  . Postpartum hypertension 07/06/10  . Pregnancy induced hypertension 2011  . Abnormal Pap smear 2010    Colpo & LEEP; LAST PAP 09/2011  . Infection     UTI X 1  . Thalassemia minor     CHRONIC  . Migraine headache     OTC  . Migraine    Past Surgical History  Procedure Laterality Date  . Breast reduction surgery  Jan 2006  . Cesarean section  2005 2011  . Dilation and curettage of uterus  2003  . Wisdom tooth extraction    . Cesarean section with bilateral tubal ligation Bilateral 05/05/2013    Procedure: REPEAT CESAREAN SECTION WITH BILATERAL TUBAL LIGATION,;  Surgeon: Hal Morales, MD;  Location: [facility];  Service: Obstetrics;  Laterality: Bilateral;   Family History  Problem Relation Age of Onset  . Thalassemia Mother   . Arthritis Mother   . Early death Mother 73  . Other Mother     GUILLON-BERRE SX; BLOOD CLOT  . Hypertension Father   . Heart attack Father   . Heart disease Father     MI  . Early death Father   .  Kidney disease Father   . Breast cancer Maternal Aunt   . Breast cancer Maternal Grandmother   . Cancer Maternal Grandfather     Bone   History  Substance Use Topics  . Smoking status: Never Smoker   . Smokeless tobacco: Never Used  . Alcohol Use: No     Comment: OCC   OB History    Gravida Para Term Preterm AB TAB SAB Ectopic Multiple Living   7 3 3  0 4 1 3  1 4      Review of Systems  Constitutional: Negative for fever and chills.  HENT: Positive for facial swelling.  Negative for drooling, sore throat and trouble swallowing.   Eyes: Negative for visual disturbance.  Respiratory: Negative for shortness of breath and wheezing.   Cardiovascular: Negative for chest pain.  Gastrointestinal: Negative for nausea, vomiting, abdominal pain and diarrhea.  Genitourinary: Negative for dysuria and hematuria.  Musculoskeletal: Negative for myalgias, arthralgias and neck pain.  Skin: Negative for color change and rash.  Allergic/Immunologic:       +allergic reaction  Neurological: Negative for weakness, numbness and headaches.  Psychiatric/Behavioral: Negative for confusion.   10 Systems reviewed and are negative for acute change except as noted in the HPI.    Allergies  Sulfa antibiotics  Home Medications   Prior to Admission medications   Medication Sig Start Date End Date Taking? Authorizing Provider  amLODipine (NORVASC) 10 MG tablet Take 1 tablet (10 mg total) by mouth daily. Patient not taking: Reported on 07/13/2014 08/12/13   Lacie ScottsSarah W Cater, MD  cloNIDine (CATAPRES) 0.1 MG tablet Take 0.5 tablets by mouth daily. 07/12/14   Historical Provider, MD  diltiazem (CARDIZEM) 60 MG tablet Take 1 tablet (60 mg total) by mouth 4 (four) times daily. 10/07/14   Carlus Pavlovristina Gherghe, MD  hydrochlorothiazide (HYDRODIURIL) 25 MG tablet Take 1 tablet (25 mg total) by mouth daily. Patient not taking: Reported on 07/13/2014 08/12/13   Lacie ScottsSarah W Cater, MD  metoprolol (LOPRESSOR) 50 MG tablet Take 1 tablet (50 mg total) by mouth 2 (two) times daily. Patient not taking: Reported on 07/13/2014 08/12/13   Lacie ScottsSarah W Cater, MD  potassium chloride (KLOR-CON) 20 MEQ packet Take 1 packet by mouth daily. 07/12/14   Historical Provider, MD  prazosin (MINIPRESS) 5 MG capsule Take 1 capsule (5 mg total) by mouth at bedtime. 10/07/14   Carlus Pavlovristina Gherghe, MD  spironolactone (ALDACTONE) 100 MG tablet Take 2 tablets (200 mg total) by mouth 2 (two) times daily. 05/08/13   Hal MoralesVanessa P Haygood, MD   BP  175/110 mmHg  Pulse 91  Temp(Src) 98.6 F (37 C) (Oral)  Resp 12  SpO2 100%  LMP 02/28/2015 Physical Exam  Constitutional: She is oriented to person, place, and time. Vital signs are normal. She appears well-developed and well-nourished.  Non-toxic appearance. No distress.  Afebrile, nontoxic, NAD. Mildly hypertensive  HENT:  Head: Normocephalic and atraumatic.  Mouth/Throat: Oropharynx is clear and moist and mucous membranes are normal. No trismus in the jaw. No uvula swelling.  Both lips swollen with mild erythema, no warmth or induration, mildly sore to palpation. No other facial swelling. Patent airway.  Eyes: Conjunctivae and EOM are normal. Right eye exhibits no chemosis and no discharge. Left eye exhibits no chemosis and no discharge.  Neck: Normal range of motion and phonation normal. Neck supple.  No stridor  Cardiovascular: Normal rate, regular rhythm, normal heart sounds and intact distal pulses.  Exam reveals no gallop and no friction rub.  No murmur heard. Pulmonary/Chest: Effort normal and breath sounds normal. No stridor. No respiratory distress. She has no decreased breath sounds. She has no wheezes. She has no rhonchi. She has no rales.  Abdominal: Soft. Normal appearance and bowel sounds are normal. She exhibits no distension. There is no tenderness. There is no rigidity, no rebound, no guarding, no CVA tenderness, no tenderness at McBurney's point and negative Murphy's sign.  Musculoskeletal: Normal range of motion.  Neurological: She is alert and oriented to person, place, and time. She has normal strength. No sensory deficit.  Skin: Skin is warm, dry and intact. No rash noted. There is erythema.  Lip swelling/erythema as noted above  Psychiatric: She has a normal mood and affect.  Nursing note and vitals reviewed.   ED Course  Procedures (including critical care time) Labs Review Labs Reviewed - No data to display  Imaging Review No results found.   EKG  Interpretation None      MDM   Final diagnoses:  Allergic reaction caused by a drug  Angioedema of lips, initial encounter  Lip swelling  HTN (hypertension), benign    34 y.o. female here with allergic reaction to either flagyl or fluconazole. Episode in January with flagyl also reported as being a medication potentially involved. This is likely the culprit. Lips swollen and mildly erythematous without induration or warmth, mildly sore to touch. Patent airway, VSS aside from HTN. Pt is asymptomatic for HTN and has not taken meds yet this morning. Will give prednisone, pepcid, and benadryl now. Will likely d/c with zantac, prednisone, benadryl, and switch to metrogel (pt has used this in the past and doesn't recall having issues).   12:47 PM Lips slightly less erythematous and swollen ~1hr after tx. No worsening of symptoms. Will d/c home with metrogel and zantac/pred/benadryl. Discussed continuing fluconazole, if she has worsening symptoms then d/c this and call the dr who prescribed it and ask for change in script. Will have her f/up with PCP in 1wk for recheck of symptoms. I explained the diagnosis and have given explicit precautions to return to the ER including for any other new or worsening symptoms. The patient understands and accepts the medical plan as it's been dictated and I have answered their questions. Discharge instructions concerning home care and prescriptions have been given. The patient is STABLE and is discharged to home in good condition.  BP 167/102 mmHg  Pulse 87  Temp(Src) 98.6 F (37 C) (Oral)  Resp 13  SpO2 99%  LMP 02/28/2015  Meds ordered this encounter  Medications  . predniSONE (DELTASONE) tablet 60 mg    Sig:   . famotidine (PEPCID) tablet 40 mg    Sig:   . diphenhydrAMINE (BENADRYL) capsule 50 mg    Sig:   . diphenhydrAMINE (BENADRYL) 25 MG tablet    Sig: Take 2 tablets (50 mg total) by mouth every 6 (six) hours. x5 days    Dispense:  40 tablet     Refill:  0    Order Specific Question:  Supervising Provider    Answer:  Hyacinth Meeker, BRIAN [[id number]]  . ranitidine (ZANTAC) 150 MG tablet    Sig: Take 1 tablet (150 mg total) by mouth 2 (two) times daily. x5 days    Dispense:  10 tablet    Refill:  0    Order Specific Question:  Supervising Provider    Answer:  Hyacinth Meeker, BRIAN [[id number]]  . predniSONE (DELTASONE) 20 MG tablet    Sig: 3 tabs po  daily x 4 days    Dispense:  12 tablet    Refill:  0    Order Specific Question:  Supervising Provider    Answer:  MILLER, BRIAN [[id number]]  . metroNIDAZOLE (METROGEL VAGINAL) 0.75 % vaginal gel    Sig: Place 1 Applicatorful vaginally at bedtime. x5 days    Dispense:  70 g    Refill:  0    Order Specific Question:  Supervising Provider    Answer:  Eber Hong [[id number]]     Maxen Rowland Camprubi-Soms, PA-C 03/07/15 1253  Doug Sou, MD 03/07/15 1606

## 2015-03-07 NOTE — Discharge Instructions (Signed)
Take Prednisone, Benadryl, and zantac as prescribed. Continue your usual home medications. Stop taking metronidazole and start using metrogel instead. Use your fluconazole as directed but if issues arise then call the doctor who prescribed it to get it changed. Get plenty of rest and drink plenty of fluids. Avoid any known triggers. Please followup with your primary doctor for discussion of your diagnoses and further evaluation after today's visit in 1 week. Return to the ER for changes or worsening symptoms.   Allergies  Allergies may happen from anything your body is sensitive to. This may be food, medicines, pollens, chemicals, and many other things. Food allergies can be severe and deadly.  HOME CARE  If you do not know what causes a reaction, keep a diary. Write down the foods you ate and the symptoms that followed. Avoid foods that cause reactions.  If you have red raised spots (hives) or a rash:  Take medicine as told by your doctor.  Use medicines for red raised spots and itching as needed.  Apply cold cloths (compresses) to the skin. Take a cool bath. Avoid hot baths or showers.  If you are severely allergic:  It is often necessary to go to the hospital after you have treated your reaction.  Wear your medical alert jewelry.  You and your family must learn how to give a allergy shot or use an allergy kit (anaphylaxis kit).  Always carry your allergy kit or shot with you. Use this medicine as told by your doctor if a severe reaction is occurring. GET HELP RIGHT AWAY IF:  You have trouble breathing or are making high-pitched whistling sounds (wheezing).  You have a tight feeling in your chest or throat.  You have a puffy (swollen) mouth.  You have red raised spots, puffiness (swelling), or itching all over your body.  You have had a severe reaction that was helped by your allergy kit or shot. The reaction can return once the medicine has worn off.  You think you are having  a food allergy. Symptoms most often happen within 30 minutes of eating a food.  Your symptoms have not gone away within 2 days or are getting worse.  You have new symptoms.  You want to retest yourself with a food or drink you think causes an allergic reaction. Only do this under the care of a doctor. MAKE SURE YOU:   Understand these instructions.  Will watch your condition.  Will get help right away if you are not doing well or get worse. Document Released: 12/15/2012 Document Reviewed: 12/15/2012 [facility] Patient Information 2015 [place]. This information is not intended to replace advice given to you by your health care provider. Make sure you discuss any questions you have with your health care provider.  Angioedema Angioedema is sudden puffiness (swelling), often of the skin. It can happen:  On your face or privates (genitals).  In your belly (abdomen) or other body parts. It usually happens quickly and gets better in 1 or 2 days. It often starts at night and is found when you wake up. You may get red, itchy patches of skin (hives). Attacks can be dangerous if your breathing passages get puffy. The condition may happen only once, or it can come back at random times. It may happen for several years before it goes away for good. HOME CARE  Only take medicines as told by your doctor.  Always carry your emergency allergy medicines with you.  Wear a medical bracelet as  told by your doctor.  Avoid things that you know will cause attacks (triggers). GET HELP IF:  You have another attack.  Your attacks happen more often or get worse.  The condition was passed to you by your parents and you want to have children. GET HELP RIGHT AWAY IF:   Your mouth, tongue, or lips are very puffy.  You have trouble breathing.  You have trouble swallowing.  You pass out (faint). MAKE SURE YOU:   Understand these instructions.  Will watch your condition.  Will get help  right away if you are not doing well or get worse. Document Released: 08/08/2009 Document Revised: 06/10/2013 Document Reviewed: 04/13/2013 [facility] Patient Information 2015 [place], [place]. This information is not intended to replace advice given to you by your health care provider. Make sure you discuss any questions you have with your health care provider.  Drug Allergy A drug allergy means you have a strange reaction to a medicine. You may have puffiness (swelling), itching, red rashes, and hives. Some allergic reactions can be life-threatening. HOME CARE  If you do not know what caused your reaction:  Write down medicines you use.  Write down any problems you have after using medicine.  Avoid things that cause a reaction.  You can see an allergy doctor to be tested for allergies. If you have hives or a rash:  Take medicine as told by your doctor.  Place cold cloths on your skin.  Do not take hot baths or hot showers. Take baths in cool water. If you are severely allergic:  Wear a medical bracelet or necklace that lists your allergy.  Carry your allergy kit or medicine shot to treat severe allergic reactions with you. These can save your life.  Do not drive until medicine from your shot has worn off, unless your doctor says it is okay. GET HELP RIGHT AWAY IF:   Your mouth is puffy, or you have trouble breathing.  You have a tight feeling in your chest or throat.  You have hives, puffiness, or itching all over your body.  You throw up (vomit) or have watery poop (diarrhea).  You feel dizzy or pass out (faint).  You think you are having a reaction. Problems often start within 30 minutes after taking a medicine.  You are getting worse, not better.  You have new problems.  Your problems go away and then come back. This is an emergency. Use your medicine shot or allergy kit as told. Call yourlocal emergency services (911 in [place] after the shot. Even if you feel  better after the shot, you need to go to the hospital. You may need more medicine to control a severe reaction. MAKE SURE YOU:  Understand these instructions.  Will watch your condition.  Will get help right away if you are not doing well or get worse. Document Released: 09/27/2004 Document Revised: 11/12/2011 Document Reviewed: 02/15/2011 [facility] Patient Information 2015 [place], [place]. This information is not intended to replace advice given to you by your health care provider. Make sure you discuss any questions you have with your health care provider.

## 2015-03-07 NOTE — ED Notes (Addendum)
Pt coming from home with allergic rxn with angioedema. She is taking Flagyl and Fluconazole at home for a bacterial infection. Pt reports she noticed lip swelling yesterday. She denies pain, shortness of breath, or trouble swallowing.  Pt reports taking Benadryl at home with no improvement.

## 2016-02-11 ENCOUNTER — Observation Stay (HOSPITAL_COMMUNITY)
Admission: EM | Admit: 2016-02-11 | Discharge: 2016-02-13 | Disposition: A | Discharge status: Home / Self Care | Payer: BC Managed Care – PPO | Attending: Internal Medicine | Admitting: Internal Medicine

## 2016-02-11 ENCOUNTER — Encounter (HOSPITAL_COMMUNITY): Payer: Self-pay

## 2016-02-11 DIAGNOSIS — I10 Essential (primary) hypertension: Secondary | ICD-10-CM | POA: Diagnosis not present

## 2016-02-11 DIAGNOSIS — I160 Hypertensive urgency: Secondary | ICD-10-CM | POA: Diagnosis present

## 2016-02-11 DIAGNOSIS — E876 Hypokalemia: Secondary | ICD-10-CM | POA: Diagnosis not present

## 2016-02-11 DIAGNOSIS — E269 Hyperaldosteronism, unspecified: Secondary | ICD-10-CM | POA: Diagnosis present

## 2016-02-11 DIAGNOSIS — R790 Abnormal level of blood mineral: Secondary | ICD-10-CM | POA: Diagnosis present

## 2016-02-11 DIAGNOSIS — R202 Paresthesia of skin: Secondary | ICD-10-CM | POA: Diagnosis present

## 2016-02-11 DIAGNOSIS — D649 Anemia, unspecified: Secondary | ICD-10-CM | POA: Diagnosis present

## 2016-02-11 LAB — COMPREHENSIVE METABOLIC PANEL
ALBUMIN: 3.2 g/dL — AB (ref 3.5–5.0)
ALK PHOS: 81 U/L (ref 38–126)
ALT: 15 U/L (ref 14–54)
AST: 18 U/L (ref 15–41)
Anion gap: 10 (ref 5–15)
BILIRUBIN TOTAL: 0.4 mg/dL (ref 0.3–1.2)
BUN: 7 mg/dL (ref 6–20)
CO2: 32 mmol/L (ref 22–32)
Calcium: 8 mg/dL — ABNORMAL LOW (ref 8.9–10.3)
Chloride: 98 mmol/L — ABNORMAL LOW (ref 101–111)
Creatinine, Ser: 0.82 mg/dL (ref 0.44–1.00)
GFR calc Af Amer: >60 mL/min (ref >60)
GLUCOSE: 104 mg/dL — AB (ref 65–99)
POTASSIUM: 2.3 mmol/L — AB (ref 3.5–5.1)
Sodium: 140 mmol/L (ref 135–145)
TOTAL PROTEIN: 6.9 g/dL (ref 6.5–8.1)

## 2016-02-11 LAB — CBC WITH DIFFERENTIAL/PLATELET
Basophils Absolute: 0 10*3/uL (ref 0.0–0.1)
Basophils Relative: 0 %
Eosinophils Absolute: 0.2 10*3/uL (ref 0.0–0.7)
Eosinophils Relative: 2 %
HEMATOCRIT: 33.9 % — AB (ref 36.0–46.0)
Hemoglobin: 10.3 g/dL — ABNORMAL LOW (ref 12.0–15.0)
LYMPHS PCT: 22 %
Lymphs Abs: 1.8 10*3/uL (ref 0.7–4.0)
MCH: 20 pg — ABNORMAL LOW (ref 26.0–34.0)
MCHC: 30.4 g/dL (ref 30.0–36.0)
MCV: 65.8 fL — ABNORMAL LOW (ref 78.0–100.0)
MONOS PCT: 7 %
Monocytes Absolute: 0.6 10*3/uL (ref 0.1–1.0)
Neutro Abs: 5.5 10*3/uL (ref 1.7–7.7)
Neutrophils Relative %: 69 %
Platelets: 328 10*3/uL (ref 150–400)
RBC: 5.15 MIL/uL — AB (ref 3.87–5.11)
RDW: 16.2 % — AB (ref 11.5–15.5)
WBC: 8.1 10*3/uL (ref 4.0–10.5)

## 2016-02-11 MED ORDER — POTASSIUM CHLORIDE CRYS ER 20 MEQ PO TBCR
40.0000 meq | EXTENDED_RELEASE_TABLET | Freq: Once | ORAL | Status: AC
  Administered 2016-02-11: 40 meq via ORAL
  Filled 2016-02-11: qty 2

## 2016-02-11 NOTE — ED Notes (Signed)
Pt reports having hyperaldosteronism and says she can tell when her potassium is low and describes symptoms such as tingling in her hands and her chest. These symptoms started Thursday.

## 2016-02-11 NOTE — ED Notes (Signed)
Potassium 2.3 critical lab notification. Dr. Dalene SeltzerSchlossman notified, ordered 40po potassium. Acuity changed to 2

## 2016-02-12 DIAGNOSIS — E876 Hypokalemia: Secondary | ICD-10-CM

## 2016-02-12 DIAGNOSIS — R790 Abnormal level of blood mineral: Secondary | ICD-10-CM | POA: Diagnosis present

## 2016-02-12 DIAGNOSIS — D649 Anemia, unspecified: Secondary | ICD-10-CM | POA: Diagnosis not present

## 2016-02-12 DIAGNOSIS — E269 Hyperaldosteronism, unspecified: Secondary | ICD-10-CM | POA: Diagnosis not present

## 2016-02-12 DIAGNOSIS — I160 Hypertensive urgency: Secondary | ICD-10-CM

## 2016-02-12 DIAGNOSIS — I10 Essential (primary) hypertension: Secondary | ICD-10-CM | POA: Diagnosis not present

## 2016-02-12 LAB — PHOSPHORUS
PHOSPHORUS: 2.1 mg/dL — AB (ref 2.5–4.6)
PHOSPHORUS: 1.7 mg/dL — AB (ref 2.5–4.6)
Phosphorus: 1.5 mg/dL — ABNORMAL LOW (ref 2.5–4.6)

## 2016-02-12 LAB — CBC
HCT: 34.7 % — ABNORMAL LOW (ref 36.0–46.0)
HEMATOCRIT: 35 % — AB (ref 36.0–46.0)
HEMOGLOBIN: 10.2 g/dL — AB (ref 12.0–15.0)
HEMOGLOBIN: 10.3 g/dL — AB (ref 12.0–15.0)
MCH: 19.3 pg — ABNORMAL LOW (ref 26.0–34.0)
MCH: 19.7 pg — ABNORMAL LOW (ref 26.0–34.0)
MCHC: 29.4 g/dL — AB (ref 30.0–36.0)
MCHC: 29.4 g/dL — ABNORMAL LOW (ref 30.0–36.0)
MCV: 65.7 fL — ABNORMAL LOW (ref 78.0–100.0)
MCV: 66.8 fL — ABNORMAL LOW (ref 78.0–100.0)
Platelets: 345 10*3/uL (ref 150–400)
Platelets: 355 10*3/uL (ref 150–400)
RBC: 5.24 MIL/uL — AB (ref 3.87–5.11)
RBC: 5.28 MIL/uL — ABNORMAL HIGH (ref 3.87–5.11)
RDW: 16.3 % — ABNORMAL HIGH (ref 11.5–15.5)
RDW: 16.4 % — ABNORMAL HIGH (ref 11.5–15.5)
WBC: 7 10*3/uL (ref 4.0–10.5)
WBC: 7.5 10*3/uL (ref 4.0–10.5)

## 2016-02-12 LAB — BASIC METABOLIC PANEL
ANION GAP: 6 (ref 5–15)
Anion gap: 8 (ref 5–15)
BUN: 7 mg/dL (ref 6–20)
BUN: 6 mg/dL (ref 6–20)
CALCIUM: 7.7 mg/dL — AB (ref 8.9–10.3)
CO2: 34 mmol/L — ABNORMAL HIGH (ref 22–32)
CO2: 31 mmol/L (ref 22–32)
CREATININE: 0.65 mg/dL (ref 0.44–1.00)
Calcium: 7.7 mg/dL — ABNORMAL LOW (ref 8.9–10.3)
Chloride: 102 mmol/L (ref 101–111)
Chloride: 100 mmol/L — ABNORMAL LOW (ref 101–111)
Creatinine, Ser: 0.7 mg/dL (ref 0.44–1.00)
GFR calc Af Amer: >60 mL/min (ref >60)
GLUCOSE: 127 mg/dL — AB (ref 65–99)
Glucose, Bld: 104 mg/dL — ABNORMAL HIGH (ref 65–99)
POTASSIUM: 3.1 mmol/L — AB (ref 3.5–5.1)
Potassium: 2.6 mmol/L — CL (ref 3.5–5.1)
SODIUM: 142 mmol/L (ref 135–145)
SODIUM: 139 mmol/L (ref 135–145)

## 2016-02-12 LAB — FOLATE: Folate: 7 ng/mL (ref >5.9)

## 2016-02-12 LAB — MAGNESIUM
MAGNESIUM: 1.5 mg/dL — AB (ref 1.7–2.4)
MAGNESIUM: 2.1 mg/dL (ref 1.7–2.4)
Magnesium: 1.8 mg/dL (ref 1.7–2.4)

## 2016-02-12 LAB — TROPONIN I
Troponin I: 0.04 ng/mL — ABNORMAL HIGH (ref <0.031)
Troponin I: <0.03 ng/mL (ref <0.031)
Troponin I: <0.03 ng/mL (ref <0.031)

## 2016-02-12 LAB — CREATININE, SERUM: Creatinine, Ser: 0.72 mg/dL (ref 0.44–1.00)

## 2016-02-12 LAB — IRON AND TIBC
IRON: 31 ug/dL (ref 28–170)
Saturation Ratios: 10 % — ABNORMAL LOW (ref 10.4–31.8)
TIBC: 311 ug/dL (ref 250–450)
UIBC: 280 ug/dL

## 2016-02-12 LAB — VITAMIN B12: Vitamin B-12: 215 pg/mL (ref 180–914)

## 2016-02-12 LAB — FERRITIN: FERRITIN: 11 ng/mL (ref 11–307)

## 2016-02-12 LAB — RETICULOCYTES
RBC.: 5.24 MIL/uL — ABNORMAL HIGH (ref 3.87–5.11)
RETIC COUNT ABSOLUTE: 62.9 10*3/uL (ref 19.0–186.0)
RETIC CT PCT: 1.2 % (ref 0.4–3.1)

## 2016-02-12 MED ORDER — GUAIFENESIN ER 600 MG PO TB12
600.0000 mg | ORAL_TABLET | Freq: Two times a day (BID) | ORAL | Status: DC
  Administered 2016-02-12 – 2016-02-13 (×3): 600 mg via ORAL
  Filled 2016-02-12 (×3): qty 1

## 2016-02-12 MED ORDER — POTASSIUM CHLORIDE 10 MEQ/100ML IV SOLN
10.0000 meq | INTRAVENOUS | Status: AC
  Administered 2016-02-12 (×3): 10 meq via INTRAVENOUS
  Filled 2016-02-12 (×3): qty 100

## 2016-02-12 MED ORDER — AMLODIPINE BESYLATE 10 MG PO TABS
10.0000 mg | ORAL_TABLET | Freq: Every day | ORAL | Status: DC
  Administered 2016-02-12 – 2016-02-13 (×2): 10 mg via ORAL
  Filled 2016-02-12: qty 2
  Filled 2016-02-12 (×2): qty 1

## 2016-02-12 MED ORDER — VITAMIN B-12 1000 MCG PO TABS
500.0000 ug | ORAL_TABLET | Freq: Every day | ORAL | Status: DC
  Administered 2016-02-12 – 2016-02-13 (×2): 500 ug via ORAL
  Filled 2016-02-12 (×3): qty 1

## 2016-02-12 MED ORDER — MAGNESIUM SULFATE 2 GM/50ML IV SOLN
2.0000 g | Freq: Once | INTRAVENOUS | Status: AC
  Administered 2016-02-12: 2 g via INTRAVENOUS
  Filled 2016-02-12: qty 50

## 2016-02-12 MED ORDER — ACETAMINOPHEN 325 MG PO TABS
650.0000 mg | ORAL_TABLET | Freq: Four times a day (QID) | ORAL | Status: DC | PRN
  Administered 2016-02-12: 650 mg via ORAL
  Filled 2016-02-12: qty 2

## 2016-02-12 MED ORDER — POTASSIUM PHOSPHATE MONOBASIC 500 MG PO TABS
500.0000 mg | ORAL_TABLET | Freq: Two times a day (BID) | ORAL | Status: DC
Start: 2016-02-12 — End: 2016-02-12
  Filled 2016-02-12: qty 1

## 2016-02-12 MED ORDER — NORETHINDRONE 0.35 MG PO TABS: 1.0000 | ORAL_TABLET | Freq: Every day | ORAL | Status: DC

## 2016-02-12 MED ORDER — HYDROMORPHONE HCL 1 MG/ML IJ SOLN: 0.5000 mg | INTRAMUSCULAR | Status: DC | PRN

## 2016-02-12 MED ORDER — DIPHENHYDRAMINE HCL 25 MG PO CAPS: 50.0000 mg | ORAL_CAPSULE | Freq: Every evening | ORAL | Status: DC | PRN

## 2016-02-12 MED ORDER — SODIUM CHLORIDE 0.9 % IV SOLN
510.0000 mg | Freq: Once | INTRAVENOUS | Status: AC
  Administered 2016-02-12: 510 mg via INTRAVENOUS
  Filled 2016-02-12 (×2): qty 17

## 2016-02-12 MED ORDER — METOPROLOL TARTRATE 25 MG PO TABS
25.0000 mg | ORAL_TABLET | Freq: Two times a day (BID) | ORAL | Status: DC
  Administered 2016-02-12 – 2016-02-13 (×3): 25 mg via ORAL
  Filled 2016-02-12 (×3): qty 1

## 2016-02-12 MED ORDER — SPIRONOLACTONE 100 MG PO TABS
200.0000 mg | ORAL_TABLET | Freq: Two times a day (BID) | ORAL | Status: DC
  Administered 2016-02-12 – 2016-02-13 (×3): 200 mg via ORAL
  Filled 2016-02-12 (×4): qty 2
  Filled 2016-02-12 (×2): qty 8

## 2016-02-12 MED ORDER — ENOXAPARIN SODIUM 40 MG/0.4ML ~~LOC~~ SOLN
40.0000 mg | SUBCUTANEOUS | Status: DC
  Filled 2016-02-12: qty 0.4

## 2016-02-12 MED ORDER — FERROUS GLUCONATE 324 (38 FE) MG PO TABS
324.0000 mg | ORAL_TABLET | Freq: Two times a day (BID) | ORAL | Status: DC
  Administered 2016-02-12 – 2016-02-13 (×3): 324 mg via ORAL
  Filled 2016-02-12 (×4): qty 1

## 2016-02-12 MED ORDER — HYDRALAZINE HCL 20 MG/ML IJ SOLN
10.0000 mg | Freq: Three times a day (TID) | INTRAMUSCULAR | Status: DC | PRN
  Administered 2016-02-12: 10 mg via INTRAVENOUS
  Filled 2016-02-12: qty 1

## 2016-02-12 MED ORDER — POTASSIUM PHOSPHATE MONOBASIC 500 MG PO TABS
500.0000 mg | ORAL_TABLET | Freq: Three times a day (TID) | ORAL | Status: DC
  Administered 2016-02-12 – 2016-02-13 (×3): 500 mg via ORAL
  Filled 2016-02-12 (×6): qty 1

## 2016-02-12 MED ORDER — POTASSIUM CHLORIDE CRYS ER 20 MEQ PO TBCR
40.0000 meq | EXTENDED_RELEASE_TABLET | Freq: Once | ORAL | Status: AC
  Administered 2016-02-12: 40 meq via ORAL
  Filled 2016-02-12: qty 2

## 2016-02-12 MED ORDER — SODIUM CHLORIDE 0.9% FLUSH
3.0000 mL | Freq: Two times a day (BID) | INTRAVENOUS | Status: DC
Start: 2016-02-12 — End: 2016-02-13
  Administered 2016-02-12 (×3): 3 mL via INTRAVENOUS

## 2016-02-12 MED ORDER — ACETAMINOPHEN 650 MG RE SUPP: 650.0000 mg | Freq: Four times a day (QID) | RECTAL | Status: DC | PRN

## 2016-02-12 NOTE — H&P (Signed)
History and Physical    Krista [name]:[id number] DOB: 01/28/1981 DOA: 02/11/2016  PCP: Gwynneth Aliment, MD Patient coming from: home  Chief Complaint: "I knew my potassium was low"  HPI: Krista [name] is a 35 y.o. female with medical history significant primary aldosteronism. Patient states that she has chronic hypokalemia. Patient is on spironolactone for this issue. She takes when necessary potassium  Orally when she has numbness and tingling in her feet and hands.  Patient states this usually works. Patient states that occasionally she'll get numbness and tingling in her face and lips this led her no her potassium is severely low and that she needs to come to the hospital. This occurred today so the patient came to emergency room for evaluation. ED Course: patient received 40 mEq of oral potassium. IV potassium started.   Patient denies fevers, chills, cough, wheeze, chest pain, dull pain, diarrhea, constipation, fainting  And  Weakness.  Review of Systems: As per HPI otherwise 10 point review of systems negative.   Past Medical History  Diagnosis Date  . Hypokalemia 2007    IP Sept 2012  . Hypertension   . Primary aldosteronism (HCC)   . Scoliosis   . Aldosteronism (HCC) 2012  . H/O thalassemia   . H/O varicella   . H/O candidiasis   . Hx: UTI (urinary tract infection) 2010  . Sickle cell trait (HCC)   . Yeast infection   . H/O bacterial infection   . Dermatitis 2007  . BV (bacterial vaginosis) 2008  . Monilial vaginitis 2008  . Yeast vaginitis 2010  . CIN I (cervical intraepithelial neoplasia I) 2010  . Postpartum hypertension 07/06/10  . Pregnancy induced hypertension 2011  . Abnormal Pap smear 2010    Colpo & LEEP; LAST PAP 09/2011  . Infection     UTI X 1  . Thalassemia minor     CHRONIC  . Migraine headache     OTC  . Migraine     Past Surgical History  Procedure Laterality Date  . Breast reduction surgery  Jan 2006  . Cesarean section  2005 2011    . Dilation and curettage of uterus  2003  . Wisdom tooth extraction    . Cesarean section with bilateral tubal ligation Bilateral 05/05/2013    Procedure: REPEAT CESAREAN SECTION WITH BILATERAL TUBAL LIGATION,;  Surgeon: Hal Morales, MD;  Location: [facility];  Service: Obstetrics;  Laterality: Bilateral;     reports that she has never smoked. She has never used smokeless tobacco. She reports that she does not drink alcohol or use illicit drugs.  Allergies  Allergen Reactions  . Metronidazole Swelling    lips  . Sulfa Antibiotics Other (See Comments)    Eyes turn blood shot red    Family History  Problem Relation Age of Onset  . Thalassemia Mother   . Arthritis Mother   . Early death Mother 31  . Other Mother     GUILLON-BERRE SX; BLOOD CLOT  . Hypertension Father   . Heart attack Father   . Heart disease Father     MI  . Early death Father   . Kidney disease Father   . Breast cancer Maternal Aunt   . Breast cancer Maternal Grandmother   . Cancer Maternal Grandfather     Bone    Prior to Admission medications   Medication Sig Start Date End Date Taking? Authorizing Provider  diphenhydrAMINE (SOMINEX) 25 MG tablet Take 50 mg by  mouth at bedtime as needed for allergies or sleep.   Yes Historical Provider, MD  norethindrone (MICRONOR,CAMILA,ERRIN) 0.35 MG tablet Take 1 tablet by mouth daily.   Yes Historical Provider, MD  spironolactone (ALDACTONE) 100 MG tablet Take 2 tablets (200 mg total) by mouth 2 (two) times daily. 05/08/13  Yes Hal Morales, MD    Physical Exam:  Constitutional: NAD, calm, comfortable Filed Vitals:   02/11/16 2345 02/12/16 0000 02/12/16 0015 02/12/16 0100  BP: 159/93 173/94 172/95 202/109  Pulse: 83 79 82 78  Temp:      TempSrc:      Resp: SpO2: 99% 100% 100% 100%   Eyes: PERRL, lids and conjunctivae normal ENMT: Mucous membranes are moist. Posterior pharynx clear of any exudate or lesions.Normal dentition.  Neck: normal,  supple, no masses, no thyromegaly Respiratory: clear to auscultation bilaterally, no wheezing, no crackles. Normal respiratory effort. No accessory muscle use.  Cardiovascular: Regular rate and rhythm, no murmurs / rubs / gallops. No extremity edema. 2+ pedal pulses. No carotid bruits.  Abdomen: no tenderness, no masses palpated. No hepatosplenomegaly. Bowel sounds positive.  Musculoskeletal: no clubbing / cyanosis. No joint deformity upper and lower extremities. Good ROM, no contractures. Normal muscle tone.  Skin: no lesions, ulcers. No induration; eczemat around ankles Neurologic: CN 2-12 grossly intact. Sensation intact, DTR normal. Strength 5/5 in all 4.  Psychiatric: Normal judgment and insight. Alert and oriented x 3. Normal mood.   Labs on Admission: I have personally reviewed following labs and imaging studies  CBC:  Recent Labs Lab 02/11/16 2052  WBC 8.1  NEUTROABS 5.5  HGB 10.3*  HCT 33.9*  MCV 65.8*  PLT 328   Basic Metabolic Panel:  Recent Labs Lab 02/11/16 2052  NA 140  K 2.3*  CL 98*  CO2 32  GLUCOSE 104*  BUN 7  CREATININE 0.82  CALCIUM 8.0*  MG 1.5*   GFR: CrCl cannot be calculated (Unknown ideal weight.). Liver Function Tests:  Recent Labs Lab 02/11/16 2052  AST 18  ALT 15  ALKPHOS 81  BILITOT 0.4  PROT 6.9  ALBUMIN 3.2*   No results for input(s): LIPASE, AMYLASE in the last 168 hours. No results for input(s): AMMONIA in the last 168 hours. Coagulation Profile: No results for input(s): INR, PROTIME in the last 168 hours. Cardiac Enzymes: No results for input(s): CKTOTAL, CKMB, CKMBINDEX, TROPONINI in the last 168 hours. BNP (last 3 results) No results for input(s): PROBNP in the last 8760 hours. HbA1C: No results for input(s): HGBA1C in the last 72 hours. CBG: No results for input(s): GLUCAP in the last 168 hours. Lipid Profile: No results for input(s): CHOL, HDL, LDLCALC, TRIG, CHOLHDL, LDLDIRECT in the last 72 hours. Thyroid  Function Tests: No results for input(s): TSH, T4TOTAL, FREET4, T3FREE, THYROIDAB in the last 72 hours. Anemia Panel: No results for input(s): VITAMINB12, FOLATE, FERRITIN, TIBC, IRON, RETICCTPCT in the last 72 hours. Urine analysis:    Component Value Date/Time   COLORURINE YELLOW 08/09/2013 1041   APPEARANCEUR CLEAR 08/09/2013 1041   LABSPEC 1.013 08/09/2013 1041   PHURINE 8.0 08/09/2013 1041   GLUCOSEU NEGATIVE 08/09/2013 1041   HGBUR NEGATIVE 08/09/2013 1041   BILIRUBINUR NEGATIVE 08/09/2013 1041   BILIRUBINUR NEG 10/23/2012 1234   KETONESUR NEGATIVE 08/09/2013 1041   PROTEINUR NEGATIVE 08/09/2013 1041   PROTEINUR TRACE 10/23/2012 1234   UROBILINOGEN 1.0 08/09/2013 1041   UROBILINOGEN negative 10/23/2012 1234   NITRITE NEGATIVE 08/09/2013 1041  NITRITE NEG 10/23/2012 1234   LEUKOCYTESUR NEGATIVE 08/09/2013 1041   Sepsis Labs: !!!!!!!!!!!!!!!!!!!!!!!!!!!!!!!!!!!!!!!!!!!! @LABRCNTIP (procalcitonin:4,lacticidven:4) )No results found for this or any previous visit (from the past 240 hour(s)).   Radiological Exams on Admission: No results found.  EKG: Independently reviewed. Normal sinus rhythm  Assessment/Plan Principal Problem:   Hypokalemia Active Problems:   Hyperaldosteronism (HCC)   Anemia   Low magnesium levels   Low calcium levels   Hypertensive urgency   Low blood potassium   Severe hypokalemia/Prolonged QTc Patient given 40 mEq orally in the emergency room, 10 mEq IV orordered Will replace and recheck Checking other electrolytes This is a chronic recurrent issue, low K Patient admitted to telemetry. QTc will correct likely with potassium Repeat EKG in the morning  Hypertensive Urgency When necessary hydralazine 10 mg IV as needed for severe blood pressure Patient with long-standing history of elevated blood pressure with systolic blood pressure above 161180, will start patient on amlodipine 10 mg by mouth daily Serial troponin ordered No significant EKG  changes  Hyperaldosteronism Continue spironolactone 200 mg twice a day by mouth  Anemia Will monitor and recheck as needed Is microcytic, will start patient on iron and check anemia panel  Low magnesium We'll replace and recheck  Calcium, low Checking ionized calcium We'll replace and recheck as needed  Birth control Continue Micronor  DVT prophylaxis: lovenox  Code Status: full  Family Communication: unavailable  Disposition Plan: d/c home Consults called: none  Admission status: tele obs    Haydee SalterPhillip M Hobbs MD [facility] Pager [phone]  If 7PM-7AM, please contact night-coverage [email or web address] Password [email or web address]  02/12/2016, 1:36 AM

## 2016-02-12 NOTE — ED Notes (Signed)
Attempted report x1. 

## 2016-02-12 NOTE — Plan of Care (Signed)
Problem: Education: Goal: Knowledge of [facility] Education information/materials will improve Outcome: Progressing Pt educated on new medications (Metoprolol and Norvasc) and vitamins. Encouraged from Dr. Arbutus Leasat to take daily iron supplement as well as multivitamins. k 3.1 supplemented with 40 of K, Mag 1.8, given 2g, and IV feraheme. BP 162/84, HR 93, O2 100%, Afibrile. No complaints at this time will continue to monitor.

## 2016-02-12 NOTE — Progress Notes (Signed)
PROGRESS NOTE  Krista ArrowJameka L [name] ZOX:[id number] DOB: [birth year] DOA: 02/11/2016 PCP: Gwynneth AlimentSANDERS,ROBYN N, MD  Brief History:  35 year old female with a history of primary hyperaldosteronism presents with one-day history of tingling and numbness in her hands. The patient states that she has these symptoms when her potassium is low. Review of the medical record reveals that she has had multiple hospital admissions for similar symptoms in the past. However, the patient states that she began having some tingling around her lips which again she states is due to her low potassium when it gets severely low according to the patient. The patient states that she has been noncompliant with her medications including her antihypertensives and spironolactone.  Assessment/Plan: Hypokalemia in the setting of primary hyperaldosteronism -Replete aggressively -Patient has been noncompliant with home medications -Magnesium 1.5-->2.1 -recheck BMP  Hypertensive urgency -The patient has been noncompliant with her home regimen -Previously on amlodipine, metoprolol tartrate upon review of the medical record -Restart amlodipine and metoprolol tartrate -Continue spironolactone -Hydralazine prn SBP >180  Iron deficiency anemia -give one dose FeraHeme -continue po iron -iron sat 10%, ferritin 11 -B12--215--marginally low-->replete po  Hypomagnesemia -Replete  Hypocalcemia -Corrected calcium approximately 8.8 -check 25 vitamin D  Hypophosphatemia -replete -recheck phos  Elevated troponin -no chest pain -No concerning ischemic changes on EKG   Disposition Plan:   Home 6/12 if stable Family Communication:   Family updated at beside 6/11--total time 35 min; 1100-1135  Consultants:  none  Code Status:  Full   Subjective: Patient denies fevers, chills, headache, chest pain, dyspnea, nausea, vomiting, diarrhea, abdominal pain, dysuria, hematuria.  Complains of some nasal congestion without any  fevers or chills. Denies any hemoptysis.   Objective: Filed Vitals:   02/12/16 0157 02/12/16 0200 02/12/16 0252 02/12/16 0502  BP: 201/109 184/108 184/107 173/90  Pulse:  83 80   Temp:   97.8 F (36.6 C)   TempSrc:   Oral   Resp:  20 20   Height:   5\' 8"  (1.727 m)   Weight:   97.75 kg (215 lb 8 oz)   SpO2:  99% 99%     Intake/Output Summary (Last 24 hours) at 02/12/16 1130 Last data filed at 02/12/16 1100  Gross per 24 hour  Intake    740 ml  Output   2000 ml  Net  -1260 ml   Weight change:  Exam:   General:  Pt is alert, follows commands appropriately, not in acute distress  HEENT: No icterus, No thrush, No neck mass, /AT  Cardiovascular: RRR, S1/S2, no rubs, no gallops  Respiratory: CTA bilaterally, no wheezing, no crackles, no rhonchi  Abdomen: Soft/+BS, non tender, non distended, no guarding  Extremities: No edema, No lymphangitis, No petechiae, No rashes, no synovitis   Data Reviewed: I have personally reviewed following labs and imaging studies Basic Metabolic Panel:  Recent Labs Lab 02/11/16 2052 02/12/16 0145 02/12/16 0343  NA 140  --  142  K 2.3*  --  2.6*  CL 98*  --  102  CO2 32  --  34*  GLUCOSE 104*  --  127*  BUN 7  --  7  CREATININE 0.82  --  0.70  0.72  CALCIUM 8.0*  --  7.7*  MG 1.5*  --  2.1  PHOS  --  1.5* 2.1*   Liver Function Tests:  Recent Labs Lab 02/11/16 2052  AST 18  ALT 15  ALKPHOS 81  BILITOT  0.4  PROT 6.9  ALBUMIN 3.2*   No results for input(s): LIPASE, AMYLASE in the last 168 hours. No results for input(s): AMMONIA in the last 168 hours. Coagulation Profile: No results for input(s): INR, PROTIME in the last 168 hours. CBC:  Recent Labs Lab 02/11/16 2052 02/12/16 0343  WBC 8.1 7.5  7.0  NEUTROABS 5.5  --   HGB 10.3* 10.3*  10.2*  HCT 33.9* 35.0*  34.7*  MCV 65.8* 66.8*  65.7*  PLT 328 345  355   Cardiac Enzymes:  Recent Labs Lab 02/12/16 0145  TROPONINI 0.04*   BNP: Invalid input(s):  POCBNP CBG: No results for input(s): GLUCAP in the last 168 hours. HbA1C: No results for input(s): HGBA1C in the last 72 hours. Urine analysis:    Component Value Date/Time   COLORURINE YELLOW 08/09/2013 1041   APPEARANCEUR CLEAR 08/09/2013 1041   LABSPEC 1.013 08/09/2013 1041   PHURINE 8.0 08/09/2013 1041   GLUCOSEU NEGATIVE 08/09/2013 1041   HGBUR NEGATIVE 08/09/2013 1041   BILIRUBINUR NEGATIVE 08/09/2013 1041   BILIRUBINUR NEG 10/23/2012 1234   KETONESUR NEGATIVE 08/09/2013 1041   PROTEINUR NEGATIVE 08/09/2013 1041   PROTEINUR TRACE 10/23/2012 1234   UROBILINOGEN 1.0 08/09/2013 1041   UROBILINOGEN negative 10/23/2012 1234   NITRITE NEGATIVE 08/09/2013 1041   NITRITE NEG 10/23/2012 1234   LEUKOCYTESUR NEGATIVE 08/09/2013 1041   Sepsis Labs: (procalcitonin:4,lacticidven:4) )No results found for this or any previous visit (from the past 240 hour(s)).   Scheduled Meds: . amLODipine  10 mg Oral Daily  . enoxaparin (LOVENOX) injection  40 mg Subcutaneous Q24H  . ferrous gluconate  324 mg Oral BID WC  . ferumoxytol  510 mg Intravenous Once  . guaiFENesin  600 mg Oral BID  . norethindrone  1 tablet Oral Daily  . potassium phosphate (monobasic)  500 mg Oral BID WC  . sodium chloride flush  3 mL Intravenous Q12H  . spironolactone  200 mg Oral BID   Continuous Infusions:   Procedures/Studies: No results found.  Charmane Protzman, DO  [facility] Pager [phone]  If 7PM-7AM, please contact night-coverage [email or web address] Password [email or web address] 02/12/2016, 11:30 AM

## 2016-02-12 NOTE — ED Provider Notes (Signed)
CSN: [id number]     Arrival date & time 02/11/16  2043 History   First MD Initiated Contact with Patient 02/11/16 2358     Chief Complaint  Patient presents with  . Tingling     (Consider location/radiation/quality/duration/timing/severity/associated sxs/prior Treatment) HPI Comments: Patient presents with numbness and tingling to her bilateral hands and feet for the past 2 days. She states this normally have with her potassium becomes low. She has a history of hyperaldosteronism and take spironolactone. She denies any fevers, chills, nausea or vomiting. No chest pain or shortness of breath. Today she developed tingling in her chest and tingling around her lips as well. No difficulty speaking or swallowing. Similar episodes of happen with low potassium in the past. She does not take potassium supplements at home.  The history is provided by the patient.    Past Medical History  Diagnosis Date  . Hypokalemia 2007    IP Sept 2012  . Hypertension   . Primary aldosteronism (HCC)   . Scoliosis   . Aldosteronism (HCC) 2012  . H/O thalassemia   . H/O varicella   . H/O candidiasis   . Hx: UTI (urinary tract infection) 2010  . Sickle cell trait (HCC)   . Yeast infection   . H/O bacterial infection   . Dermatitis 2007  . BV (bacterial vaginosis) 2008  . Monilial vaginitis 2008  . Yeast vaginitis 2010  . CIN I (cervical intraepithelial neoplasia I) 2010  . Postpartum hypertension 07/06/10  . Pregnancy induced hypertension 2011  . Abnormal Pap smear 2010    Colpo & LEEP; LAST PAP 09/2011  . Infection     UTI X 1  . Thalassemia minor     CHRONIC  . Migraine headache     OTC  . Migraine    Past Surgical History  Procedure Laterality Date  . Breast reduction surgery  Jan 2006  . Cesarean section  2005 2011  . Dilation and curettage of uterus  2003  . Wisdom tooth extraction    . Cesarean section with bilateral tubal ligation Bilateral 05/05/2013    Procedure: REPEAT CESAREAN  SECTION WITH BILATERAL TUBAL LIGATION,;  Surgeon: Hal Morales, MD;  Location: [facility];  Service: Obstetrics;  Laterality: Bilateral;   Family History  Problem Relation Age of Onset  . Thalassemia Mother   . Arthritis Mother   . Early death Mother 66  . Other Mother     GUILLON-BERRE SX; BLOOD CLOT  . Hypertension Father   . Heart attack Father   . Heart disease Father     MI  . Early death Father   . Kidney disease Father   . Breast cancer Maternal Aunt   . Breast cancer Maternal Grandmother   . Cancer Maternal Grandfather     Bone   Social History  Substance Use Topics  . Smoking status: Never Smoker   . Smokeless tobacco: Never Used  . Alcohol Use: No     Comment: OCC   OB History    Gravida Para Term Preterm AB TAB SAB Ectopic Multiple Living   7 3 3  0 4 1 3  1 4      Review of Systems  Constitutional: Negative for fever, activity change, appetite change and fatigue.  Eyes: Negative for visual disturbance.  Respiratory: Negative for cough, chest tightness and shortness of breath.   Cardiovascular: Negative for chest pain.  Gastrointestinal: Negative for nausea, vomiting and abdominal pain.  Genitourinary: Negative for dysuria, hematuria  and vaginal discharge.  Musculoskeletal: Negative for myalgias and arthralgias.  Skin: Negative for rash.  Neurological: Positive for numbness. Negative for dizziness, weakness, light-headedness and headaches.   A complete 10 system review of systems was obtained and all systems are negative except as noted in the HPI and PMH.     Allergies  Metronidazole and Sulfa antibiotics  Home Medications   Prior to Admission medications   Medication Sig Start Date End Date Taking? Authorizing Provider  diphenhydrAMINE (SOMINEX) 25 MG tablet Take 50 mg by mouth at bedtime as needed for allergies or sleep.   Yes Historical Provider, MD  norethindrone (MICRONOR,CAMILA,ERRIN) 0.35 MG tablet Take 1 tablet by mouth daily.   Yes Historical  Provider, MD  spironolactone (ALDACTONE) 100 MG tablet Take 2 tablets (200 mg total) by mouth 2 (two) times daily. 05/08/13  Yes Hal MoralesVanessa P Haygood, MD   BP 173/94 mmHg  Pulse 79  Temp(Src) 98 F (36.7 C) (Oral)  Resp 15  SpO2 100%  LMP 01/08/2016 Physical Exam  Constitutional: She is oriented to person, place, and time. She appears well-developed and well-nourished. No distress.  HENT:  Head: Normocephalic and atraumatic.  Mouth/Throat: Oropharynx is clear and moist. No oropharyngeal exudate.  Eyes: Conjunctivae and EOM are normal. Pupils are equal, round, and reactive to light.  Neck: Normal range of motion. Neck supple.  No meningismus.  Cardiovascular: Normal rate, regular rhythm, normal heart sounds and intact distal pulses.   No murmur heard. Pulmonary/Chest: Effort normal and breath sounds normal. No respiratory distress.  Abdominal: Soft. There is no tenderness. There is no rebound and no guarding.  Musculoskeletal: Normal range of motion. She exhibits no edema or tenderness.  Neurological: She is alert and oriented to person, place, and time. No cranial nerve deficit. She exhibits normal muscle tone. Coordination normal.  No ataxia on finger to nose bilaterally. No pronator drift. 5/5 strength throughout. CN 2-12 intact.Equal grip strength. Sensation intact.   Skin: Skin is warm.  Psychiatric: She has a normal mood and affect. Her behavior is normal.  Nursing note and vitals reviewed.   ED Course  Procedures (including critical care time) Labs Review Labs Reviewed  CBC WITH DIFFERENTIAL/PLATELET - Abnormal; Notable for the following:    RBC 5.15 (*)    Hemoglobin 10.3 (*)    HCT 33.9 (*)    MCV 65.8 (*)    MCH 20.0 (*)    RDW 16.2 (*)    All other components within normal limits  COMPREHENSIVE METABOLIC PANEL - Abnormal; Notable for the following:    Potassium 2.3 (*)    Chloride 98 (*)    Glucose, Bld 104 (*)    Calcium 8.0 (*)    Albumin 3.2 (*)    All other  components within normal limits  MAGNESIUM - Abnormal; Notable for the following:    Magnesium 1.5 (*)    All other components within normal limits    Imaging Review No results found. I have personally reviewed and evaluated these images and lab results as part of my medical decision-making.   EKG Interpretation   Date/Time:  Saturday February 11 2016 20:51:34 EDT Ventricular Rate:  92 PR Interval:  148 QRS Duration: 80 QT Interval:  372 QTC Calculation: 460 R Axis:   67 Text Interpretation:  Normal sinus rhythm Nonspecific ST and T wave  abnormality Abnormal ECG Nonspecific ST abnormality Confirmed by Manus GunningANCOUR   MD, Brena Windsor ([id number]) on 02/12/2016 12:22:04 AM      MDM  Final diagnoses:  Hypokalemia  Paresthesias bilateral hands and feet as well as paresthesias of chest and mouth. No weakness. Similar symptoms from hypokalemia in the past.  Potassium is 2.3. Magnesium is 1.5. EKG with nonspecific ST changes.  Patient is given IV and by mouth potassium and magnesium. Will admit given degree of hypokalemia. Discussed with Dr. Melynda Ripple.  CRITICAL CARE Performed by: Glynn Octave Total critical care time: 30 minutes Critical care time was exclusive of separately billable procedures and treating other patients. Critical care was necessary to treat or prevent imminent or life-threatening deterioration. Critical care was time spent personally by me on the following activities: development of treatment plan with patient and/or surrogate as well as nursing, discussions with consultants, evaluation of patient's response to treatment, examination of patient, obtaining history from patient or surrogate, ordering and performing treatments and interventions, ordering and review of laboratory studies, ordering and review of radiographic studies, pulse oximetry and re-evaluation of patient's condition.     Glynn Octave, MD 02/12/16 0111

## 2016-02-13 DIAGNOSIS — E269 Hyperaldosteronism, unspecified: Secondary | ICD-10-CM | POA: Diagnosis not present

## 2016-02-13 DIAGNOSIS — E876 Hypokalemia: Secondary | ICD-10-CM | POA: Diagnosis not present

## 2016-02-13 DIAGNOSIS — I160 Hypertensive urgency: Secondary | ICD-10-CM | POA: Diagnosis not present

## 2016-02-13 LAB — BASIC METABOLIC PANEL
Anion gap: 9 (ref 5–15)
CHLORIDE: 103 mmol/L (ref 101–111)
CO2: 27 mmol/L (ref 22–32)
Calcium: 8.5 mg/dL — ABNORMAL LOW (ref 8.9–10.3)
Creatinine, Ser: 0.79 mg/dL (ref 0.44–1.00)
GFR calc non Af Amer: >60 mL/min (ref >60)
Glucose, Bld: 146 mg/dL — ABNORMAL HIGH (ref 65–99)
POTASSIUM: 3 mmol/L — AB (ref 3.5–5.1)
SODIUM: 139 mmol/L (ref 135–145)

## 2016-02-13 LAB — RENAL FUNCTION PANEL
Albumin: 3.1 g/dL — ABNORMAL LOW (ref 3.5–5.0)
Anion gap: 6 (ref 5–15)
CHLORIDE: 103 mmol/L (ref 101–111)
CO2: 30 mmol/L (ref 22–32)
CREATININE: 0.67 mg/dL (ref 0.44–1.00)
Calcium: 8.3 mg/dL — ABNORMAL LOW (ref 8.9–10.3)
GFR calc Af Amer: >60 mL/min (ref >60)
Glucose, Bld: 100 mg/dL — ABNORMAL HIGH (ref 65–99)
POTASSIUM: 2.8 mmol/L — AB (ref 3.5–5.1)
Phosphorus: 3.6 mg/dL (ref 2.5–4.6)
Sodium: 139 mmol/L (ref 135–145)

## 2016-02-13 LAB — VITAMIN D 25 HYDROXY (VIT D DEFICIENCY, FRACTURES): Vit D, 25-Hydroxy: 13.9 ng/mL — ABNORMAL LOW (ref 30.0–100.0)

## 2016-02-13 LAB — MAGNESIUM: MAGNESIUM: 1.9 mg/dL (ref 1.7–2.4)

## 2016-02-13 LAB — CALCIUM, IONIZED: CALCIUM, IONIZED, SERUM: 4.4 mg/dL — AB (ref 4.5–5.6)

## 2016-02-13 LAB — TROPONIN I

## 2016-02-13 MED ORDER — POTASSIUM CHLORIDE 10 MEQ/100ML IV SOLN
10.0000 meq | INTRAVENOUS | Status: AC
  Administered 2016-02-13 (×2): 10 meq via INTRAVENOUS
  Filled 2016-02-13 (×2): qty 100

## 2016-02-13 MED ORDER — K PHOS MONO-SOD PHOS DI & MONO 155-852-130 MG PO TABS
500.0000 mg | ORAL_TABLET | Freq: Three times a day (TID) | ORAL | Status: DC
  Filled 2016-02-13: qty 2

## 2016-02-13 MED ORDER — POTASSIUM CHLORIDE 10 MEQ/100ML IV SOLN
10.0000 meq | INTRAVENOUS | Status: AC
  Administered 2016-02-13 (×4): 10 meq via INTRAVENOUS
  Filled 2016-02-13 (×4): qty 100

## 2016-02-13 MED ORDER — METOPROLOL TARTRATE 50 MG PO TABS: 50.0000 mg | ORAL_TABLET | Freq: Two times a day (BID) | ORAL | Status: DC

## 2016-02-13 MED ORDER — POTASSIUM CHLORIDE CRYS ER 20 MEQ PO TBCR
40.0000 meq | EXTENDED_RELEASE_TABLET | Freq: Once | ORAL | Status: AC
  Administered 2016-02-13: 40 meq via ORAL
  Filled 2016-02-13: qty 2

## 2016-02-13 MED ORDER — AMLODIPINE BESYLATE 10 MG PO TABS: 10.0000 mg | ORAL_TABLET | Freq: Every day | ORAL | Status: DC

## 2016-02-13 MED ORDER — POTASSIUM CHLORIDE CRYS ER 20 MEQ PO TBCR: 40.0000 meq | EXTENDED_RELEASE_TABLET | Freq: Every day | ORAL | Status: DC

## 2016-02-13 MED ORDER — FERROUS GLUCONATE 324 (38 FE) MG PO TABS: 324.0000 mg | ORAL_TABLET | Freq: Two times a day (BID) | ORAL | Status: DC

## 2016-02-13 MED ORDER — CYANOCOBALAMIN 500 MCG PO TABS: 500.0000 ug | ORAL_TABLET | Freq: Every day | ORAL | Status: DC

## 2016-02-13 MED ORDER — METOPROLOL TARTRATE 25 MG PO TABS
25.0000 mg | ORAL_TABLET | ORAL | Status: AC
  Administered 2016-02-13: 25 mg via ORAL
  Filled 2016-02-13: qty 1

## 2016-02-13 NOTE — Progress Notes (Signed)
Discharge teaching and instructions reviewed. VSS. Pt has no further questions at this time. Awaiting IV run of K to complete. Pt will discharge via self to home.

## 2016-02-13 NOTE — Progress Notes (Signed)
The clients labs resulted and her potassium came back at 2.8. I notified [place] on call with [facility] and she ordered 4 runs of potassium to be given. I will continue to monitor the client closely.  Sheppard Evensina Corben Auzenne RN

## 2016-02-13 NOTE — Discharge Summary (Signed)
Physician Discharge Summary  Krista [name] WUJ:[id number] DOB: [birth year] DOA: 02/11/2016  PCP: Gwynneth Aliment, MD  Admit date: 02/11/2016 Discharge date: 02/13/2016  Admitted From: Home Disposition:  Home  Recommendations for Outpatient Follow-up:  1. Follow up with PCP in 1 weeks 2. Please obtain BMP in one week  Home Health:No Equipment/Devices:none  Discharge Condition:stable CODE STATUS:FULL Diet recommendation: Heart Healthy   Brief/Interim Summary 35 year old female with a history of primary hyperaldosteronism presents with one-day history of tingling and numbness in her hands. The patient states that she has these symptoms when her potassium is low. Review of the medical record reveals that she has had multiple hospital admissions for similar symptoms in the past. However, the patient states that she began having some tingling around her lips which again she states is due to her low potassium when it gets severely low according to the patient. The patient states that she has been noncompliant with her medications including her antihypertensives and spironolactone.  Discharge Diagnoses:  Hypokalemia in the setting of primary hyperaldosteronism -Replete aggressively -Patient has been noncompliant with home medications -Magnesium 1.5-->2.1 -recheck BMP -home with KCl 40 mEq daily x 4 days -recommend recheck BMP within one week  Hypertensive urgency -The patient has been noncompliant with her home regimen -Previously on amlodipine, metoprolol tartrate upon review of the medical record -home with amlodipine and metoprolol tartrate -Continue spironolactone -Hydralazine prn SBP >180  Iron deficiency anemia -given one dose FeraHeme -continue po iron bid after discharge -iron sat 10%, ferritin 11 -B12--215--marginally low-->replete po  Hypomagnesemia -Repleted -Mag--1.9  Hypocalcemia -Corrected calcium approximately 8.8 -check 25 vitamin D--pending at time of  d/c  Hypophosphatemia -replete -recheck phos--3.6  Elevated troponin -no chest pain -No concerning ischemic changes on EKG -initial trop was 0.04, neg thereafter   Discharge Instructions      Discharge Instructions    Diet - low sodium heart healthy    Complete by:  As directed      Increase activity slowly    Complete by:  As directed             Medication List    TAKE these medications        amLODipine 10 MG tablet  Commonly known as:  NORVASC  Take 1 tablet (10 mg total) by mouth daily.     cyanocobalamin 500 MCG tablet  Take 1 tablet (500 mcg total) by mouth daily.     diphenhydrAMINE 25 MG tablet  Commonly known as:  SOMINEX  Take 50 mg by mouth at bedtime as needed for allergies or sleep.     ferrous gluconate 324 MG tablet  Commonly known as:  FERGON  Take 1 tablet (324 mg total) by mouth 2 (two) times daily with a meal.     metoprolol 50 MG tablet  Commonly known as:  LOPRESSOR  Take 1 tablet (50 mg total) by mouth 2 (two) times daily.     norethindrone 0.35 MG tablet  Commonly known as:  MICRONOR,CAMILA,ERRIN  Take 1 tablet by mouth daily.     potassium chloride SA 20 MEQ tablet  Commonly known as:  K-DUR,KLOR-CON  Take 2 tablets (40 mEq total) by mouth daily.  Start taking on:  02/14/2016     spironolactone 100 MG tablet  Commonly known as:  ALDACTONE  Take 2 tablets (200 mg total) by mouth 2 (two) times daily.        Allergies  Allergen Reactions  . Metronidazole Swelling    lips  .  Sulfa Antibiotics Other (See Comments)    Eyes turn blood shot red    Consultations: none  Procedures/Studies: No results found.    Subjective:   Discharge Exam: Filed Vitals:   02/12/16 2220 02/13/16 0500  BP: 173/95 164/96  Pulse:  84  Temp: 97.9 F (36.6 C) 98.8 F (37.1 C)  Resp: 16    Filed Vitals:   02/12/16 0502 02/12/16 1400 02/12/16 2220 02/13/16 0500  BP: 173/90 162/84 173/95 164/96  Pulse:    84  Temp:  98.5 F (36.9  C) 97.9 F (36.6 C) 98.8 F (37.1 C)  TempSrc:  Oral Oral Oral  Resp:  12 16   Height:      Weight:    95.936 kg (211 lb 8 oz)  SpO2:  100% 94% 99%    General: Pt is alert, awake, not in acute distress Cardiovascular: RRR, S1/S2 +, no rubs, no gallops Respiratory: CTA bilaterally, no wheezing, no rhonchi Abdominal: Soft, NT, ND, bowel sounds + Extremities: no edema, no cyanosis   The results of significant diagnostics from this hospitalization (including imaging, microbiology, ancillary and laboratory) are listed below for reference.    Significant Diagnostic Studies: No results found.   Microbiology: No results found for this or any previous visit (from the past 240 hour(s)).   Labs: Basic Metabolic Panel:  Recent Labs Lab 02/11/16 2052 02/12/16 0145 02/12/16 0343 02/12/16 1146 02/13/16 0045 02/13/16 0928  NA 140  --  142 139 139 139  K 2.3*  --  2.6* 3.1* 2.8* 3.0*  CL 98*  --  102 100* 103 103  CO2 32  --  34* 31 30 27   GLUCOSE 104*  --  127* 104* 100* 146*  BUN 7  --  7 6 <5* <5*  CREATININE 0.82  --  0.70  0.72 0.65 0.67 0.79  CALCIUM 8.0*  --  7.7* 7.7* 8.3* 8.5*  MG 1.5*  --  2.1 1.8 1.9  --   PHOS  --  1.5* 2.1* 1.7* 3.6  --    Liver Function Tests:  Recent Labs Lab 02/11/16 2052 02/13/16 0045  AST 18  --   ALT 15  --   ALKPHOS 81  --   BILITOT 0.4  --   PROT 6.9  --   ALBUMIN 3.2* 3.1*   No results for input(s): LIPASE, AMYLASE in the last 168 hours. No results for input(s): AMMONIA in the last 168 hours. CBC:  Recent Labs Lab 02/11/16 2052 02/12/16 0343  WBC 8.1 7.5  7.0  NEUTROABS 5.5  --   HGB 10.3* 10.3*  10.2*  HCT 33.9* 35.0*  34.7*  MCV 65.8* 66.8*  65.7*  PLT 328 345  355   Cardiac Enzymes:  Recent Labs Lab 02/12/16 0145 02/12/16 1146 02/12/16 1740 02/13/16 0045  TROPONINI 0.04* <0.03 <0.03 <0.03   BNP: Invalid input(s): POCBNP CBG: No results for input(s): GLUCAP in the last 168 hours.  Time  coordinating discharge:  Greater than 30 minutes  Signed:  Rayne Loiseau, DO [facility] Pager: [phone] 02/13/2016, 11:45 AM

## 2016-12-30 ENCOUNTER — Ambulatory Visit (INDEPENDENT_AMBULATORY_CARE_PROVIDER_SITE_OTHER): Payer: BLUE CROSS/BLUE SHIELD

## 2016-12-30 ENCOUNTER — Encounter (HOSPITAL_COMMUNITY): Payer: Self-pay | Admitting: *Deleted

## 2016-12-30 ENCOUNTER — Ambulatory Visit (HOSPITAL_COMMUNITY)
Admission: EM | Admit: 2016-12-30 | Discharge: 2016-12-30 | Disposition: A | Discharge status: Home / Self Care | Payer: BLUE CROSS/BLUE SHIELD | Attending: Internal Medicine | Admitting: Internal Medicine

## 2016-12-30 DIAGNOSIS — S93402A Sprain of unspecified ligament of left ankle, initial encounter: Secondary | ICD-10-CM

## 2016-12-30 MED ORDER — TRAMADOL HCL 50 MG PO TABS: 50.0000 mg | ORAL_TABLET | Freq: Four times a day (QID) | ORAL | 0 refills | Status: DC | PRN

## 2016-12-30 NOTE — Discharge Instructions (Signed)
An ankle sprain can take several weeks to return to normal. Follow-up with orthopedics if you feel that your ankle is worsening or fails to improve.

## 2016-12-30 NOTE — ED Provider Notes (Signed)
CSN: [id number]     Arrival date & time 12/30/16  1357 History   First MD Initiated Contact with Patient 12/30/16 1633     Chief Complaint  Patient presents with  . Ankle Pain   (Consider location/radiation/quality/duration/timing/severity/associated sxs/prior Treatment)  HPI   Patient is a 36 year old female presenting today with complaints of left ankle pain. Patient states that she is "clumsy" and frequently rolls her ankles. Patient states that he days ago she everted her left ankle and was in immediate pain. Patient denies hearing a pop or snap. Patient states that she's had difficulty bearing weight up until a day or so ago. Patient states she is still getting swelling and discomfort and occasional difficulty bearing weight.  Has done rest ice and elevation as treatment.  Past Medical History:  Diagnosis Date  . Hypertension    History reviewed. No pertinent surgical history. History reviewed. No pertinent family history. Social History  Substance Use Topics  . Smoking status: Never Smoker  . Smokeless tobacco: Never Used  . Alcohol use Not on file   OB History    No data available     Review of Systems  Constitutional: Negative.  Negative for fatigue and fever.  HENT: Negative.   Eyes: Negative.  Negative for visual disturbance.  Respiratory: Negative.  Negative for cough and shortness of breath.   Cardiovascular: Negative.  Negative for chest pain and leg swelling.  Gastrointestinal: Negative.   Endocrine: Negative.   Genitourinary: Negative.   Musculoskeletal: Negative.  Negative for gait problem and neck stiffness.  Skin: Negative.   Allergic/Immunologic: Negative.   Neurological: Negative.  Negative for dizziness and headaches.  Hematological: Negative.   Psychiatric/Behavioral: Negative.     Allergies  Flagyl [metronidazole] and Sulfa antibiotics  Home Medications   Prior to Admission medications   Medication Sig Start Date End Date Taking? Authorizing  Provider  potassium chloride (KLOR-CON) 20 MEQ packet Take by mouth 2 (two) times daily.   Yes Historical Provider, MD  spironolactone (ALDACTONE) 100 MG tablet Take 100 mg by mouth daily.   Yes Historical Provider, MD   Meds Ordered and Administered this Visit  Medications - No data to display  BP (!) 192/88 (BP Location: Right Arm) Comment: rn notified   Pulse 81   Temp 98.6 F (37 C) (Oral)   Resp 16   LMP 12/27/2016   SpO2 100%  No data found.   Physical Exam  Constitutional: She is oriented to person, place, and time. She appears well-developed and well-nourished. No distress.  Eyes: Conjunctivae are normal. Pupils are equal, round, and reactive to light. Right eye exhibits no discharge. Left eye exhibits no discharge. No scleral icterus.  Neck: Normal range of motion. Neck supple. No thyromegaly present.  Cardiovascular: Normal rate, regular rhythm, normal heart sounds and intact distal pulses.  Exam reveals no gallop and no friction rub.   No murmur heard. Pulmonary/Chest: Effort normal and breath sounds normal. No respiratory distress. She has no wheezes. She has no rales. She exhibits no tenderness.  Musculoskeletal: Normal range of motion. She exhibits edema and tenderness. She exhibits no deformity.       Left ankle: She exhibits swelling. She exhibits normal range of motion, no ecchymosis, no deformity, no laceration and normal pulse. Achilles tendon exhibits pain. Achilles tendon exhibits no defect and normal Thompson's test results.  No breaks in skin. Patient is able to bear weight and climb up to the examination table without assistance but some difficulty.  Neurological: She is alert and oriented to person, place, and time. No sensory deficit.  Port some tingling in her toes distal to injury.  Skin: Skin is warm and dry. Capillary refill takes less than 2 seconds. She is not diaphoretic.  Nursing note and vitals reviewed.   Urgent Care Course     Procedures  (including critical care time)  Labs Review Labs Reviewed - No data to display  Imaging Review Dg Ankle Complete Left  Result Date: 12/30/2016 CLINICAL DATA:  Pt rolled left ankle internally 8 days ago, pain is focused on lateral malleolus which radiates to anterior ankle with associated swelling. Occasional numbness and tingling in toes. Constant throbbing pain, sharp pain when trying to bear weight. No previus injury to ankle. Nondiabetic. EXAM: LEFT ANKLE COMPLETE - 3+ VIEW COMPARISON:  None. FINDINGS: No fracture or bone lesion. The ankle joint is normally spaced and aligned. No arthropathic change. There is soft tissue swelling which predominates laterally. IMPRESSION: No fracture or ankle joint abnormality. Electronically Signed   By: Amie Portland M.D.   On: 12/30/2016 16:58    Patient requests an ankle xray- "just to be sure".   Offered Ibuprofen for pain, patient declined.  States it is bad "at night".   MDM   1. Sprain of left ankle, unspecified ligament, initial encounter    Logged into database to verify medications prior to prescribing for pain. Given ankle stirrup splint.   Meds ordered this encounter  Medications  . spironolactone (ALDACTONE) 100 MG tablet    Sig: Take 100 mg by mouth daily.  . potassium chloride (KLOR-CON) 20 MEQ packet    Sig: Take by mouth 2 (two) times daily.  . traMADol (ULTRAM) 50 MG tablet    Sig: Take 1 tablet (50 mg total) by mouth every 6 (six) hours as needed.    Dispense:  15 tablet    Refill:  0   The usual and customary discharge instructions and warnings were given.  The patient verbalizes understanding and agrees to plan of care.       Servando Salina, NP 12/30/16 1712

## 2016-12-30 NOTE — ED Notes (Signed)
Patient did not take BP meds this am

## 2016-12-30 NOTE — ED Triage Notes (Signed)
Patient states she twisted ankle 8 days ago and is still having swelling and pain.

## 2017-01-04 ENCOUNTER — Encounter (HOSPITAL_COMMUNITY): Payer: Self-pay | Admitting: *Deleted

## 2017-02-05 ENCOUNTER — Other Ambulatory Visit: Payer: Self-pay | Admitting: Obstetrics and Gynecology

## 2017-02-11 NOTE — Patient Instructions (Addendum)
Your procedure is scheduled on: Thursday February 21, 2017 at 1:00 pm  [facility] at: 11:30 am  Pick up the phone at the desk and dial [phone].  Call this number if you have problems the morning of surgery: [phone].  Remember: Do NOT eat food: after Midnight on Wednesday June, 20 Do NOT drink clear liquids after: 7 am on Thursday June 21 Take these medicines the morning of surgery with a SIP OF WATER: NONE  Do not smoke on the day of surgery.  Stop all vitamins and supplements 1 week prior to surgery  Do NOT wear jewelry (body piercing), metal hair clips/bobby pins, make-up, or nail polish. Do NOT wear lotions, powders, or perfumes.  You may wear deoderant. Do NOT shave for 48 hours prior to surgery. Do NOT bring valuables to the hospital. Contacts, dentures, or bridgework may not be worn into surgery.  Have a responsible adult drive you home and stay with you for 24 hours after your procedure

## 2017-02-14 ENCOUNTER — Other Ambulatory Visit: Payer: Self-pay

## 2017-02-14 ENCOUNTER — Encounter (HOSPITAL_COMMUNITY)
Admission: RE | Admit: 2017-02-14 | Discharge: 2017-02-14 | Disposition: A | Discharge status: Home / Self Care | Payer: BLUE CROSS/BLUE SHIELD | Source: Ambulatory Visit | Attending: Obstetrics and Gynecology | Admitting: Obstetrics and Gynecology

## 2017-02-14 ENCOUNTER — Encounter (HOSPITAL_COMMUNITY): Payer: Self-pay

## 2017-02-14 DIAGNOSIS — Z01812 Encounter for preprocedural laboratory examination: Secondary | ICD-10-CM | POA: Diagnosis not present

## 2017-02-14 DIAGNOSIS — Z01810 Encounter for preprocedural cardiovascular examination: Secondary | ICD-10-CM | POA: Diagnosis not present

## 2017-02-14 DIAGNOSIS — Z01818 Encounter for other preprocedural examination: Secondary | ICD-10-CM | POA: Diagnosis present

## 2017-02-14 HISTORY — DX: Disorder of adrenal gland, unspecified: E27.9

## 2017-02-14 HISTORY — DX: Hyperaldosteronism, unspecified: E26.9

## 2017-02-14 LAB — BASIC METABOLIC PANEL
ANION GAP: 7 (ref 5–15)
BUN: 12 mg/dL (ref 6–20)
CALCIUM: 9.5 mg/dL (ref 8.9–10.3)
CO2: 27 mmol/L (ref 22–32)
CREATININE: 0.89 mg/dL (ref 0.44–1.00)
Chloride: 104 mmol/L (ref 101–111)
GFR calc Af Amer: >60 mL/min (ref >60)
GFR calc non Af Amer: >60 mL/min (ref >60)
GLUCOSE: 106 mg/dL — AB (ref 65–99)
Potassium: 3.8 mmol/L (ref 3.5–5.1)
Sodium: 138 mmol/L (ref 135–145)

## 2017-02-14 LAB — CBC
HCT: 35.7 % — ABNORMAL LOW (ref 36.0–46.0)
HEMOGLOBIN: 11.4 g/dL — AB (ref 12.0–15.0)
MCH: 21.1 pg — AB (ref 26.0–34.0)
MCHC: 31.9 g/dL (ref 30.0–36.0)
MCV: 66.1 fL — ABNORMAL LOW (ref 78.0–100.0)
Platelets: 358 10*3/uL (ref 150–400)
RBC: 5.4 MIL/uL — ABNORMAL HIGH (ref 3.87–5.11)
RDW: 15.7 % — ABNORMAL HIGH (ref 11.5–15.5)
WBC: 6.8 10*3/uL (ref 4.0–10.5)

## 2017-02-14 NOTE — Pre-Procedure Instructions (Signed)
Discussed patient's blood pressure at pre op with Dr. Sandford Craze Jackson. BP 180/118. Called patient and left message on phone that she needs to see her MD and get another BP medicine in addition to Aldactone she already takes. Informed patient in diastolic is over 161100 day of surgery she may be canceled.

## 2017-02-21 ENCOUNTER — Encounter (HOSPITAL_COMMUNITY)
Admission: RE | Disposition: A | Discharge status: Home / Self Care | Payer: Self-pay | Source: Ambulatory Visit | Attending: Obstetrics and Gynecology

## 2017-02-21 ENCOUNTER — Encounter (HOSPITAL_COMMUNITY): Payer: Self-pay | Admitting: Anesthesiology

## 2017-02-21 ENCOUNTER — Ambulatory Visit (HOSPITAL_COMMUNITY)
Admission: RE | Admit: 2017-02-21 | Discharge: 2017-02-21 | Disposition: A | Discharge status: Home / Self Care | Payer: BLUE CROSS/BLUE SHIELD | Source: Ambulatory Visit | Attending: Obstetrics and Gynecology | Admitting: Obstetrics and Gynecology

## 2017-02-21 ENCOUNTER — Encounter (HOSPITAL_COMMUNITY): Payer: Self-pay | Admitting: *Deleted

## 2017-02-21 DIAGNOSIS — Z538 Procedure and treatment not carried out for other reasons: Secondary | ICD-10-CM | POA: Insufficient documentation

## 2017-02-21 DIAGNOSIS — I10 Essential (primary) hypertension: Secondary | ICD-10-CM | POA: Diagnosis not present

## 2017-02-21 DIAGNOSIS — N920 Excessive and frequent menstruation with regular cycle: Secondary | ICD-10-CM | POA: Diagnosis present

## 2017-02-21 SURGERY — DILATATION & CURETTAGE/HYSTEROSCOPY WITH NOVASURE ABLATION
Anesthesia: Choice

## 2017-02-21 MED ORDER — SCOPOLAMINE 1 MG/3DAYS TD PT72
MEDICATED_PATCH | TRANSDERMAL | Status: AC
  Filled 2017-02-21: qty 1

## 2017-02-21 MED ORDER — LACTATED RINGERS IV SOLN: INTRAVENOUS | Status: DC

## 2017-02-21 MED ORDER — SCOPOLAMINE 1 MG/3DAYS TD PT72: 1.0000 | MEDICATED_PATCH | Freq: Once | TRANSDERMAL | Status: DC

## 2017-02-21 MED ORDER — CEFAZOLIN SODIUM-DEXTROSE 2-4 GM/100ML-% IV SOLN: 2.0000 g | INTRAVENOUS | Status: DC

## 2017-02-21 NOTE — Anesthesia Preprocedure Evaluation (Deleted)
Anesthesia Evaluation  Patient identified by MRN, date of birth, ID band Patient awake    Airway        Dental   Pulmonary           Cardiovascular hypertension,      Neuro/Psych    GI/Hepatic   Endo/Other    Renal/GU      Musculoskeletal   Abdominal   Peds  Hematology   Anesthesia Other Findings   Reproductive/Obstetrics                             Anesthesia Physical Anesthesia Plan  ASA:   Anesthesia Plan:    Post-op Pain Management:    Induction:   PONV Risk Score and Plan:   Airway Management Planned:   Additional Equipment:   Intra-op Plan:   Post-operative Plan:   Informed Consent:   Plan Discussed with:   Anesthesia Plan Comments: (Case cancelled due to elevated blood pressure DBP 110-120's. Pt has appt with PCP next week. Dr. Billy Coastaavon will reschedule patient after BP under better control. Pt understands and is agreeable. )        Anesthesia Quick Evaluation

## 2017-02-21 NOTE — H&P (Signed)
Pt presented for surgery. NO complaints. BP (!) 175/110   Pulse 81   Temp 98.4 F (36.9 C) (Oral)   Resp 16   SpO2 100%   Uncontrolled HTN PCP fu scheduled Case postphoned HTN precautions given upon DC by anesthesia.

## 2017-03-19 ENCOUNTER — Other Ambulatory Visit: Payer: Self-pay | Admitting: Obstetrics and Gynecology

## 2017-03-22 ENCOUNTER — Ambulatory Visit (HOSPITAL_COMMUNITY): Payer: BLUE CROSS/BLUE SHIELD | Admitting: Anesthesiology

## 2017-03-22 ENCOUNTER — Encounter (HOSPITAL_COMMUNITY)
Admission: RE | Disposition: A | Discharge status: Home / Self Care | Payer: Self-pay | Source: Ambulatory Visit | Attending: Obstetrics and Gynecology

## 2017-03-22 ENCOUNTER — Ambulatory Visit (HOSPITAL_COMMUNITY)
Admission: RE | Admit: 2017-03-22 | Discharge: 2017-03-22 | Disposition: A | Discharge status: Home / Self Care | Payer: BLUE CROSS/BLUE SHIELD | Source: Ambulatory Visit | Attending: Obstetrics and Gynecology | Admitting: Obstetrics and Gynecology

## 2017-03-22 ENCOUNTER — Encounter (HOSPITAL_COMMUNITY): Payer: Self-pay | Admitting: Anesthesiology

## 2017-03-22 DIAGNOSIS — Z882 Allergy status to sulfonamides status: Secondary | ICD-10-CM | POA: Insufficient documentation

## 2017-03-22 DIAGNOSIS — N840 Polyp of corpus uteri: Secondary | ICD-10-CM | POA: Diagnosis not present

## 2017-03-22 DIAGNOSIS — Z9889 Other specified postprocedural states: Secondary | ICD-10-CM | POA: Diagnosis not present

## 2017-03-22 DIAGNOSIS — N920 Excessive and frequent menstruation with regular cycle: Secondary | ICD-10-CM | POA: Insufficient documentation

## 2017-03-22 DIAGNOSIS — Z8249 Family history of ischemic heart disease and other diseases of the circulatory system: Secondary | ICD-10-CM | POA: Diagnosis not present

## 2017-03-22 DIAGNOSIS — Z9851 Tubal ligation status: Secondary | ICD-10-CM | POA: Insufficient documentation

## 2017-03-22 DIAGNOSIS — Z888 Allergy status to other drugs, medicaments and biological substances status: Secondary | ICD-10-CM | POA: Insufficient documentation

## 2017-03-22 HISTORY — PX: DILITATION & CURRETTAGE/HYSTROSCOPY WITH NOVASURE ABLATION: SHX5568

## 2017-03-22 LAB — BASIC METABOLIC PANEL
ANION GAP: 5 (ref 5–15)
BUN: 15 mg/dL (ref 6–20)
CHLORIDE: 104 mmol/L (ref 101–111)
CO2: 27 mmol/L (ref 22–32)
Calcium: 9.7 mg/dL (ref 8.9–10.3)
Creatinine, Ser: 0.97 mg/dL (ref 0.44–1.00)
GFR calc Af Amer: >60 mL/min (ref >60)
GFR calc non Af Amer: >60 mL/min (ref >60)
GLUCOSE: 115 mg/dL — AB (ref 65–99)
POTASSIUM: 4.7 mmol/L (ref 3.5–5.1)
Sodium: 136 mmol/L (ref 135–145)

## 2017-03-22 LAB — CBC
HCT: 36.3 % (ref 36.0–46.0)
HEMOGLOBIN: 11.6 g/dL — AB (ref 12.0–15.0)
MCH: 21.5 pg — AB (ref 26.0–34.0)
MCHC: 32 g/dL (ref 30.0–36.0)
MCV: 67.2 fL — ABNORMAL LOW (ref 78.0–100.0)
PLATELETS: 393 10*3/uL (ref 150–400)
RBC: 5.4 MIL/uL — AB (ref 3.87–5.11)
RDW: 16.4 % — ABNORMAL HIGH (ref 11.5–15.5)
WBC: 6.8 10*3/uL (ref 4.0–10.5)

## 2017-03-22 SURGERY — DILATATION & CURETTAGE/HYSTEROSCOPY WITH NOVASURE ABLATION
Anesthesia: General | Site: Vagina

## 2017-03-22 MED ORDER — HYDROMORPHONE HCL 1 MG/ML IJ SOLN: 0.2500 mg | INTRAMUSCULAR | Status: DC | PRN

## 2017-03-22 MED ORDER — BUPIVACAINE HCL (PF) 0.25 % IJ SOLN
INTRAMUSCULAR | Status: AC
  Filled 2017-03-22: qty 30

## 2017-03-22 MED ORDER — SCOPOLAMINE 1 MG/3DAYS TD PT72
MEDICATED_PATCH | TRANSDERMAL | Status: AC
  Filled 2017-03-22: qty 1

## 2017-03-22 MED ORDER — LIDOCAINE HCL (CARDIAC) 20 MG/ML IV SOLN
INTRAVENOUS | Status: AC
  Filled 2017-03-22: qty 5

## 2017-03-22 MED ORDER — FENTANYL CITRATE (PF) 100 MCG/2ML IJ SOLN
INTRAMUSCULAR | Status: AC
  Filled 2017-03-22: qty 2

## 2017-03-22 MED ORDER — LACTATED RINGERS IV SOLN
INTRAVENOUS | Status: DC
  Administered 2017-03-22: 125 mL/h via INTRAVENOUS

## 2017-03-22 MED ORDER — KETOROLAC TROMETHAMINE 30 MG/ML IJ SOLN
INTRAMUSCULAR | Status: AC
  Filled 2017-03-22: qty 1

## 2017-03-22 MED ORDER — ONDANSETRON HCL 4 MG/2ML IJ SOLN
INTRAMUSCULAR | Status: AC
  Filled 2017-03-22: qty 2

## 2017-03-22 MED ORDER — PROPOFOL 10 MG/ML IV BOLUS
INTRAVENOUS | Status: DC | PRN
  Administered 2017-03-22: 200 mg via INTRAVENOUS

## 2017-03-22 MED ORDER — FENTANYL CITRATE (PF) 100 MCG/2ML IJ SOLN
INTRAMUSCULAR | Status: DC | PRN
  Administered 2017-03-22 (×2): 50 ug via INTRAVENOUS

## 2017-03-22 MED ORDER — KETOROLAC TROMETHAMINE 30 MG/ML IJ SOLN
INTRAMUSCULAR | Status: DC | PRN
  Administered 2017-03-22: 30 mg via INTRAVENOUS

## 2017-03-22 MED ORDER — DEXAMETHASONE SODIUM PHOSPHATE 4 MG/ML IJ SOLN
INTRAMUSCULAR | Status: AC
  Filled 2017-03-22: qty 1

## 2017-03-22 MED ORDER — SCOPOLAMINE 1 MG/3DAYS TD PT72
1.0000 | MEDICATED_PATCH | Freq: Once | TRANSDERMAL | Status: DC
  Administered 2017-03-22: 1.5 mg via TRANSDERMAL

## 2017-03-22 MED ORDER — PROMETHAZINE HCL 25 MG/ML IJ SOLN: 6.2500 mg | INTRAMUSCULAR | Status: DC | PRN

## 2017-03-22 MED ORDER — MIDAZOLAM HCL 2 MG/2ML IJ SOLN
INTRAMUSCULAR | Status: AC
  Filled 2017-03-22: qty 2

## 2017-03-22 MED ORDER — CEFAZOLIN SODIUM-DEXTROSE 2-4 GM/100ML-% IV SOLN
2.0000 g | INTRAVENOUS | Status: AC
  Administered 2017-03-22: 2 g via INTRAVENOUS

## 2017-03-22 MED ORDER — SODIUM CHLORIDE 0.9 % IR SOLN
Status: DC | PRN
  Administered 2017-03-22: 3000 mL

## 2017-03-22 MED ORDER — TRAMADOL HCL 50 MG PO TABS: 50.0000 mg | ORAL_TABLET | Freq: Four times a day (QID) | ORAL | 0 refills | Status: DC | PRN

## 2017-03-22 MED ORDER — DEXAMETHASONE SODIUM PHOSPHATE 10 MG/ML IJ SOLN
INTRAMUSCULAR | Status: DC | PRN
  Administered 2017-03-22: 4 mg via INTRAVENOUS

## 2017-03-22 MED ORDER — MIDAZOLAM HCL 2 MG/2ML IJ SOLN
INTRAMUSCULAR | Status: DC | PRN
  Administered 2017-03-22: 1 mg via INTRAVENOUS

## 2017-03-22 MED ORDER — LIDOCAINE HCL (CARDIAC) 20 MG/ML IV SOLN
INTRAVENOUS | Status: DC | PRN
  Administered 2017-03-22: 30 mg via INTRAVENOUS
  Administered 2017-03-22: 70 mg via INTRAVENOUS

## 2017-03-22 MED ORDER — BUPIVACAINE HCL (PF) 0.25 % IJ SOLN
INTRAMUSCULAR | Status: DC | PRN
  Administered 2017-03-22: 20 mL

## 2017-03-22 MED ORDER — ONDANSETRON HCL 4 MG/2ML IJ SOLN
INTRAMUSCULAR | Status: DC | PRN
  Administered 2017-03-22: 4 mg via INTRAVENOUS

## 2017-03-22 MED ORDER — PROPOFOL 10 MG/ML IV BOLUS
INTRAVENOUS | Status: AC
  Filled 2017-03-22: qty 20

## 2017-03-22 SURGICAL SUPPLY — 16 items
ABLATOR ENDOMETRIAL BIPOLAR (ABLATOR) ×2 IMPLANT
CANISTER SUCT 3000ML PPV (MISCELLANEOUS) ×2 IMPLANT
CATH ROBINSON RED A/P 16FR (CATHETERS) ×2 IMPLANT
CLOTH BEACON ORANGE TIMEOUT ST (SAFETY) ×2 IMPLANT
CONTAINER PREFILL 10% NBF 60ML (FORM) ×4 IMPLANT
GLOVE BIO SURGEON STRL SZ7.5 (GLOVE) ×2 IMPLANT
GLOVE BIOGEL PI IND STRL 7.0 (GLOVE) ×1 IMPLANT
GLOVE BIOGEL PI INDICATOR 7.0 (GLOVE) ×1
GOWN STRL REUS W/TWL LRG LVL3 (GOWN DISPOSABLE) ×4 IMPLANT
PACK VAGINAL MINOR WOMEN LF (CUSTOM PROCEDURE TRAY) ×2 IMPLANT
PAD OB MATERNITY 4.3X12.25 (PERSONAL CARE ITEMS) ×2 IMPLANT
PAD PREP 24X48 CUFFED NSTRL (MISCELLANEOUS) ×2 IMPLANT
SYR TB 1ML 25GX5/8 (SYRINGE) ×2 IMPLANT
TOWEL OR 17X24 6PK STRL BLUE (TOWEL DISPOSABLE) ×4 IMPLANT
TUBING AQUILEX INFLOW (TUBING) ×2 IMPLANT
TUBING AQUILEX OUTFLOW (TUBING) ×2 IMPLANT

## 2017-03-22 NOTE — Anesthesia Procedure Notes (Signed)
Procedure Name: LMA Insertion Date/Time: 03/22/2017 10:52 AM Performed by: Suella GroveMOORE, Manish Ruggiero C Pre-anesthesia Checklist: Patient identified, Patient being monitored, Emergency Drugs available and Suction available Patient Re-evaluated:Patient Re-evaluated prior to induction Oxygen Delivery Method: Circle system utilized and Simple face mask Preoxygenation: Pre-oxygenation with 100% oxygen Induction Type: IV induction and Inhalational induction Ventilation: Mask ventilation without difficulty LMA: LMA inserted LMA Size: 4.0 Grade View: Grade II Tube type: Oral Number of attempts: 1 Placement Confirmation: positive ETCO2 and breath sounds checked- equal and bilateral Dental Injury: Teeth and Oropharynx as per pre-operative assessment

## 2017-03-22 NOTE — Transfer of Care (Signed)
Immediate Anesthesia Transfer of Care Note  Patient: Krista [name]  Procedure(s) Performed: Procedure(s): DILATATION & CURETTAGE/HYSTEROSCOPY WITH NOVASURE ABLATION (N/A)  Patient Location: PACU  Anesthesia Type:General  Level of Consciousness: awake, alert , oriented and patient cooperative  Airway & Oxygen Therapy: Patient Spontanous Breathing and Patient connected to nasal cannula oxygen  Post-op Assessment: Report given to RN and Post -op Vital signs reviewed and stable  Post vital signs: Reviewed and stable  Last Vitals:  Vitals:   03/22/17 1009  BP: (!) 156/99  Pulse: 89  Resp: 16  Temp: 36.8 C    Last Pain:  Vitals:   03/22/17 1009  TempSrc: Oral      Patients Stated Pain Goal: 5 (03/22/17 1009)  Complications: No apparent anesthesia complications

## 2017-03-22 NOTE — H&P (Signed)
NAMTobey Grim:  Krista [name], Krista [name]             ACCOUNT NO.:  [id number]  MEDICAL RECORD NO.:  [id number]  LOCATION:                                FACILITY:  [facility]  PHYSICIAN:  Lenoard Adenichard J. Raiza Kiesel, M.D.     DATE OF BIRTH:  DATE OF ADMISSION:  03/22/2017 DATE OF DISCHARGE:                             HISTORY & PHYSICAL   CHIEF COMPLAINT:  Menorrhagia.  HISTORY OF PRESENT ILLNESS:  She is a 36 year old African American female, G4, P3, who presents for definitive therapy of menorrhagia.  MEDICATIONS:  Many pills and spironolactone.  ALLERGIES:  DIFLUCAN and SULFA.  SOCIAL HISTORY:  She is a nonsmoker and nondrinker.  She denies domestic physical violence.  FAMILY HISTORY:  Heart disease and hypertension.  PAST SURGICAL HISTORY:  Remarkable for C-section x3 and tubal ligation and breast reduction.  PHYSICAL EXAMINATION:  GENERAL:  This is a well-developed, well- nourished, African American female, in no acute distress. HEENT:  Normal. NECK:  Supple.  Full range of motion. LUNGS:  Clear. HEART:  Regular rate and rhythm. ABDOMEN:  Soft, nontender. PELVIC:  Reveals normal size uterus and no adnexal masses. EXTREMITIES:  There are no cords. NEUROLOGIC:  Nonfocal. SKIN:  Intact.  IMPRESSION:  Menorrhagia for definitive therapy.  PLAN:  Proceed with diagnostic hysteroscopy, D and C, NovaSure endometrial ablation.  Risks of anesthesia, infection, bleeding, injury to surrounding organs, possible need for repair discussed, delayed versus immediate complications to include bowel and bladder injury noted.  The patient acknowledges and wished to proceed.     Lenoard Adenichard J. Delmont Prosch, M.D.     RJT/MEDQ  D:  03/21/2017  T:  03/21/2017  Job:  [id number]

## 2017-03-22 NOTE — Discharge Instructions (Signed)
°  Post Anesthesia Home Care Instructions ° °Activity: °Get plenty of rest for the remainder of the day. A responsible individual must stay with you for 24 hours following the procedure.  °For the next 24 hours, DO NOT: °-Drive a car °-Operate machinery °-Drink alcoholic beverages °-Take any medication unless instructed by your physician °-Make any legal decisions or sign important papers. ° °Meals: °Start with liquid foods such as gelatin or soup. Progress to regular foods as tolerated. Avoid greasy, spicy, heavy foods. If nausea and/or vomiting occur, drink only clear liquids until the nausea and/or vomiting subsides. Call your physician if vomiting continues. ° °Special Instructions/Symptoms: °Your throat may feel dry or sore from the anesthesia or the breathing tube placed in your throat during surgery. If this causes discomfort, gargle with warm salt water. The discomfort should disappear within 24 hours. ° °If you had a scopolamine patch placed behind your ear for the management of post- operative nausea and/or vomiting: ° °1. The medication in the patch is effective for 72 hours, after which it should be removed.  Wrap patch in a tissue and discard in the trash. Wash hands thoroughly with soap and water. °2. You may remove the patch earlier than 72 hours if you experience unpleasant side effects which may include dry mouth, dizziness or visual disturbances. °3. Avoid touching the patch. Wash your hands with soap and water after contact with the patch. °  °DISCHARGE INSTRUCTIONS: D&C / D&E °The following instructions have been prepared to help you care for yourself upon your return home. °  °Personal hygiene: °• Use sanitary pads for vaginal drainage, not tampons. °• Shower the day after your procedure. °• NO tub baths, pools or Jacuzzis for 2-3 weeks. °• Wipe front to back after using the bathroom. ° °Activity and limitations: °• Do NOT drive or operate any equipment for 24 hours. The effects of anesthesia are  still present and drowsiness may result. °• Do NOT rest in bed all day. °• Walking is encouraged. °• Walk up and down stairs slowly. °• You may resume your normal activity in one to two days or as indicated by your physician. ° °Sexual activity: NO intercourse for at least 2 weeks after the procedure, or as indicated by your physician. ° °Diet: Eat a light meal as desired this evening. You may resume your usual diet tomorrow. ° °Return to work: You may resume your work activities in one to two days or as indicated by your doctor. ° °What to expect after your surgery: Expect to have vaginal bleeding/discharge for 2-3 days and spotting for up to 10 days. It is not unusual to have soreness for up to 1-2 weeks. You may have a slight burning sensation when you urinate for the first day. Mild cramps may continue for a couple of days. You may have a regular period in 2-6 weeks. ° °Call your doctor for any of the following: °• Excessive vaginal bleeding, saturating and changing one pad every hour. °• Inability to urinate 6 hours after discharge from hospital. °• Pain not relieved by pain medication. °• Fever of 100.4° F or greater. °• Unusual vaginal discharge or odor. ° ° Call for an appointment:  ° ° °Patient’s signature: ______________________ ° °Nurse’s signature ________________________ ° °Support person's signature_______________________ ° ° ° °

## 2017-03-22 NOTE — Anesthesia Postprocedure Evaluation (Signed)
Anesthesia Post Note  Patient: Krista [name]  Procedure(s) Performed: Procedure(s) (LRB): DILATATION & CURETTAGE/HYSTEROSCOPY WITH NOVASURE ABLATION (N/A)     Patient location during evaluation: PACU Anesthesia Type: General Level of consciousness: sedated Pain management: pain level controlled Vital Signs Assessment: post-procedure vital signs reviewed and stable Respiratory status: spontaneous breathing and respiratory function stable Cardiovascular status: stable Anesthetic complications: no    Last Vitals:  Vitals:   03/22/17 1230 03/22/17 1330  BP: (!) 159/80 (!) 181/97  Pulse: 75 88  Resp: 16 16  Temp: 36.8 C 36.8 C    Last Pain:  Vitals:   03/22/17 1009  TempSrc: Oral   Pain Goal: Patients Stated Pain Goal: 5 (03/22/17 1009)               [name] DANIEL

## 2017-03-22 NOTE — Anesthesia Preprocedure Evaluation (Addendum)
Anesthesia Evaluation  Patient identified by MRN, date of birth, ID band Patient awake    Reviewed: Allergy & Precautions, NPO status , Patient's Chart, lab work & pertinent test results  History of Anesthesia Complications Negative for: history of anesthetic complications  Airway Mallampati: II  TM Distance: >3 FB Neck ROM: Full    Dental no notable dental hx. (+) Dental Advisory Given   Pulmonary neg pulmonary ROS,    Pulmonary exam normal        Cardiovascular hypertension, Pt. on medications Normal cardiovascular exam     Neuro/Psych negative neurological ROS  negative psych ROS   GI/Hepatic negative GI ROS, Neg liver ROS,   Endo/Other  negative endocrine ROS  Renal/GU negative Renal ROS     Musculoskeletal   Abdominal   Peds  Hematology  (+) Sickle cell trait and anemia ,   Anesthesia Other Findings   Reproductive/Obstetrics                           Anesthesia Physical  Anesthesia Plan  ASA: II  Anesthesia Plan: General   Post-op Pain Management:    Induction: Intravenous  PONV Risk Score and Plan: 3 and Ondansetron, Dexamethasone and Scopolamine patch - Pre-op  Airway Management Planned: LMA  Additional Equipment:   Intra-op Plan:   Post-operative Plan: Extubation in OR  Informed Consent: I have reviewed the patients History and Physical, chart, labs and discussed the procedure including the risks, benefits and alternatives for the proposed anesthesia with the patient or authorized representative who has indicated his/her understanding and acceptance.   Dental advisory given  Plan Discussed with: CRNA, Anesthesiologist and Surgeon  Anesthesia Plan Comments:       Anesthesia Quick Evaluation

## 2017-03-22 NOTE — Progress Notes (Signed)
Patient seen and examined. Consent witnessed and signed. No changes noted. Update completed. Patient ID: Krista ArrowJameka L Rys, female   DOB: 02-06-1981, 36 y.o.   MRN: [id number]

## 2017-03-22 NOTE — Op Note (Signed)
03/22/2017  11:16 AM  PATIENT:  Krista [name]  36 y.o. female  PRE-OPERATIVE DIAGNOSIS:  Menorrhagia  POST-OPERATIVE DIAGNOSIS:  menorrhagia  PROCEDURE:  Procedure(s): DILATATION & CURETTAGE HYSTEROSCOPY WITH NOVASURE ABLATION ENDOMETRIAL POLYPECTOMY  SURGEON:  Surgeon(s): Olivia Mackieaavon, Phelan Goers, MD  ASSISTANTS: none   ANESTHESIA:   local and general  ESTIMATED BLOOD LOSS: MINIMAL FLUID DEFICIT: 90CC  DRAINS: none   LOCAL MEDICATIONS USED:  MARCAINE    and Amount: 20 ml  SPECIMEN:  Source of Specimen:  EMC WITH POLYP  DISPOSITION OF SPECIMEN:  PATHOLOGY  COUNTS:  YES  DICTATION #: [id number]  PLAN OF CARE: DC HOME  PATIENT DISPOSITION:  PACU - hemodynamically stable.

## 2017-03-22 NOTE — Op Note (Signed)
[name], Krista [name]              ACCOUNT NO.:  [id number]  MEDICAL RECORD NO.:  [id number]  LOCATION:                                 FACILITY:  PHYSICIAN:  Lenoard Adenichard J. Lanelle Lindo, M.D.     DATE OF BIRTH:  DATE OF PROCEDURE: DATE OF DISCHARGE:                              OPERATIVE REPORT   PREOPERATIVE DIAGNOSIS:  Menorrhagia.  POSTOPERATIVE DIAGNOSIS:  Menorrhagia plus endometrial polyp.  PROCEDURE:  Diagnostic hysteroscopy, D and C, endometrial polypectomy, and NovaSure endometrial ablation.  SURGEON:  Lenoard Adenichard J. Deloris Moger, M.D.  ASSISTANT:  None.  ANESTHESIA:  Local and general.  ESTIMATED BLOOD LOSS:  Less than 50 mL.  FLUID DEFICIT:  90 mL.  COMPLICATIONS:  None.  DRAINS:  None.  COUNTS:  Correct.  SPECIMEN:  Endometrial curettings and polyps to Pathology.  DISPOSITION:  The patient to recovery room in good condition.  BRIEF OPERATIVE NOTE:  After being apprised of the risks of anesthesia, infection, bleeding, injury to surrounding organs, possible need for repair, delayed versus immediate complications to include bowel and bladder injury, possible need for repair, possible failure of the NovaSure device to be approximately 5% or less.  The patient was brought to the operating room, where she was administered general anesthetic without complications.  Prepped and draped in usual sterile fashion, catheterized until the bladder was empty.  Exam under anesthesia revealed a mid position, bulky uterus.  No adnexal masses.  At this time, dilute paracervical block placed 20 mL total of a dilute Marcaine solution.  At this time, cervix easily dilated up to a #27 Pratt dilator.  Hysteroscope placed.  Visualization reveals thickening along the posterior wall, which was resected using polyp forceps.  D and C were performed using sharp curettage in a 4-quadrant method and cavity after polypectomy and D and C appears to be empty and otherwise, normal with bilateral normal  tubal ostia.  The NovaSure device was entered, seated to a length of 6.5, a width of 4, initiated after a negative CO2 test to a power of 147 watts for 52 seconds.  The device was then removed, inspected, found to be intact.  Revisualization of the endometrial cavity reveals a well-ablated endometrial cavity and no evidence of uterine perforation.  At this point, all instruments removed.  The patient tolerated the procedure well, was awakened and transferred to recovery in good condition.  Specimen to pathology.     Lenoard Adenichard J. Avier Jech, M.D.     RJT/MEDQ  D:  03/22/2017  T:  03/22/2017  Job:  [id number]

## 2017-03-23 ENCOUNTER — Encounter (HOSPITAL_COMMUNITY): Payer: Self-pay | Admitting: Obstetrics and Gynecology

## 2019-07-06 ENCOUNTER — Encounter: Payer: Self-pay | Admitting: Family Medicine

## 2019-07-06 ENCOUNTER — Ambulatory Visit (INDEPENDENT_AMBULATORY_CARE_PROVIDER_SITE_OTHER): Payer: Commercial Managed Care - PPO | Admitting: Family Medicine

## 2019-07-06 ENCOUNTER — Other Ambulatory Visit: Payer: Self-pay

## 2019-07-06 VITALS — BP 210/100 | HR 86 | Temp 99.0°F | Resp 16 | Ht 68.11 in | Wt 223.0 lb

## 2019-07-06 DIAGNOSIS — E876 Hypokalemia: Secondary | ICD-10-CM

## 2019-07-06 DIAGNOSIS — E269 Hyperaldosteronism, unspecified: Secondary | ICD-10-CM

## 2019-07-06 DIAGNOSIS — I160 Hypertensive urgency: Secondary | ICD-10-CM | POA: Diagnosis not present

## 2019-07-06 MED ORDER — SPIRONOLACTONE 100 MG PO TABS: 100.0000 mg | ORAL_TABLET | Freq: Two times a day (BID) | ORAL | 3 refills | Status: DC

## 2019-07-06 NOTE — Progress Notes (Signed)
11/2/20201:56 PM  Krista [name] 07/19/1981, 38 y.o., female [id number]  Chief Complaint  Patient presents with  . Establish Care    need pcp to manage medication for HTN  . Hypertension    HPI:   Patient is a 38 y.o. female with past medical history significant for HTN thought to be secondary to hyperaldosteronism who presents today to establish care  Has been off spironolactone for past several months, used 200mg  BID, on that it was around 140/90 H/o profound hypokalemia requiring long hospitalizations She follows a high potassium diet She takes postassium supplements as needed, "I feel when my potassium is low", feels tingling, takes 40-60 meq x1 and then back to normal feeling She has BTL for contraception Last saw endo in 2015, Dr Letta Median - reviewed, wanted to recheck aldosterone and PRA off spironolactone and r/o secondary causes of elevated aldosterone.  Medical record reviewed   Depression screen PHQ 2/9 07/06/2019  Decreased Interest 0  Down, Depressed, Hopeless 0  PHQ - 2 Score 0    Fall Risk  07/06/2019  Falls in the past year? 0  Number falls in past yr: 0  Injury with Fall? 0     Allergies  Allergen Reactions  . Metronidazole Swelling    lips  . Sulfa Antibiotics Other (See Comments)    Eyes turn blood shot red    Prior to Admission medications   Medication Sig Start Date End Date Taking? Authorizing Provider  spironolactone (ALDACTONE) 100 MG tablet Take 100 mg by mouth 2 (two) times daily.    Yes [provider]    Past Medical History:  Diagnosis Date  . Abnormal Pap smear 2010   Colpo & LEEP; LAST PAP 09/2011  . Adrenal gland disorder ([place]) 2002  . Aldosteronism ([place]) 2012  . BV (bacterial vaginosis) 2008  . CIN I (cervical intraepithelial neoplasia I) 2010  . Dermatitis 2007  . H/O bacterial infection   . H/O candidiasis   . H/O thalassemia   . H/O varicella   . Hx: UTI (urinary tract infection) 2010  . Hyperaldosteronism  ([place])   . Hypertension   . Hypokalemia 2007   IP Sept 2012  . Infection    UTI X 1  . Migraine   . Migraine headache    OTC  . Monilial vaginitis 2008  . Postpartum hypertension 07/06/10  . Pregnancy induced hypertension 2011  . Primary aldosteronism ([place])   . Scoliosis   . Sickle cell trait ([place])   . Thalassemia minor    CHRONIC  . Yeast infection   . Yeast vaginitis 2010    Past Surgical History:  Procedure Laterality Date  . BREAST REDUCTION SURGERY  Jan 2006  . CESAREAN SECTION  2005 2011 2014  . CESAREAN SECTION WITH BILATERAL TUBAL LIGATION Bilateral 05/05/2013   Procedure: REPEAT CESAREAN SECTION WITH BILATERAL TUBAL LIGATION,;  Surgeon: Eldred Manges, MD;  Location: [facility];  Service: Obstetrics;  Laterality: Bilateral;  . DILATION AND CURETTAGE OF UTERUS  2003  . DILITATION & CURRETTAGE/HYSTROSCOPY WITH NOVASURE ABLATION N/A 03/22/2017   Procedure: DILATATION & CURETTAGE/HYSTEROSCOPY WITH NOVASURE ABLATION;  Surgeon: Brien Few, MD;  Location: [facility];  Service: Gynecology;  Laterality: N/A;  . TUBAL LIGATION    . WISDOM TOOTH EXTRACTION      Social History   Tobacco Use  . Smoking status: Never Smoker  . Smokeless tobacco: Never Used  Substance Use Topics  . Alcohol use: Yes    Comment:  OCC    Family History  Problem Relation Age of Onset  . Thalassemia Mother   . Arthritis Mother   . Early death Mother [id number]  . Other Mother        GUILLON-BERRE SX; BLOOD CLOT  . Hypertension Father   . Heart attack Father   . Heart disease Father        MI  . Early death Father   . Kidney disease Father   . Breast cancer Maternal Aunt   . Breast cancer Maternal Grandmother   . Cancer Maternal Grandfather        Bone    Review of Systems  Constitutional: Negative for chills and fever.  Eyes: Negative for blurred vision and double vision.  Respiratory: Negative for cough and shortness of breath.   Cardiovascular: Negative for chest pain, palpitations and leg  swelling.  Gastrointestinal: Negative for abdominal pain, nausea and vomiting.  Neurological: Positive for dizziness. Negative for tingling, speech change and focal weakness.  All other systems reviewed and are negative.    OBJECTIVE:  Today's Vitals   07/06/19 1355  BP: (!) 210/100  Pulse: 86  Resp: 16  Temp: 99 F (37.2 C)  TempSrc: Oral  SpO2: 98%  Weight: 223 lb (101.2 kg)  Height: 5' 8.11" (1.73 m)   Body mass index is 33.8 kg/m.   Physical Exam Vitals signs and nursing note reviewed.  Constitutional:      Appearance: She is well-developed.  HENT:     Head: Normocephalic and atraumatic.     Mouth/Throat:     Pharynx: No oropharyngeal exudate.  Eyes:     General: No scleral icterus.    Extraocular Movements: Extraocular movements intact.     Conjunctiva/sclera: Conjunctivae normal.     Pupils: Pupils are equal, round, and reactive to light.  Neck:     Musculoskeletal: Neck supple.     Thyroid: No thyroid mass, thyromegaly or thyroid tenderness.  Cardiovascular:     Rate and Rhythm: Normal rate and regular rhythm.     Heart sounds: Normal heart sounds. No murmur. No friction rub. No gallop.   Pulmonary:     Effort: Pulmonary effort is normal.     Breath sounds: Normal breath sounds. No wheezing, rhonchi or rales.  Musculoskeletal:     Right lower leg: No edema.     Left lower leg: No edema.  Lymphadenopathy:     Cervical: No cervical adenopathy.  Skin:    General: Skin is warm and dry.  Neurological:     General: No focal deficit present.     Mental Status: She is alert and oriented to person, place, and time.  Psychiatric:        Mood and Affect: Mood normal.        Behavior: Behavior normal.     No results found for this or any previous visit (from the past 24 hour(s)).  No results found.   ASSESSMENT and PLAN  1. Hypertensive urgency Asymptomatic. Off med. Restarting spironolactone, discussed titration to previous dose, 200mg  BID. Discussed  she might need second agent. RTC precautions reviewed - Comprehensive metabolic panel - CBC - TSH - Aldosterone + renin activity w/ ratio - US Renal Artery Stenosis; Future - Ambulatory referral to Endocrinology  2. Hyperaldosteronism ([facility]) Off meds currently. Labs and US pending. Referring back to endocrinology for further eval and treatment. Discussed with patient importance of being under endo care. - Comprehensive metabolic panel - CBC - TSH -  Aldosterone + renin activity w/ ratio - US Renal Artery Stenosis; Future - Ambulatory referral to Endocrinology  3. Hypokalemia Follows high K diet with prn use of supplement, labs pending. Does not need refills at this time  Other orders - spironolactone (ALDACTONE) 100 MG tablet; Take 1 tablet (100 mg total) by mouth 2 (two) times daily.  Return in about 2 weeks (around 07/20/2019).    Myles Lipps, MD Primary Care at [facility] [address] [place], [place] [address] Ph.  [phone] Fax [phone]

## 2019-07-06 NOTE — Patient Instructions (Signed)
° ° ° °  If you have lab work done today you will be contacted with your lab results within the next 2 weeks.  If you have not heard from us then please contact us. The fastest way to get your results is to register for My Chart. ° ° °IF you received an x-ray today, you will receive an invoice from [facility]. Please contact [facility] at [phone] with questions or concerns regarding your invoice.  ° °IF you received labwork today, you will receive an invoice from [facility]. Please contact [facility] at [phone] with questions or concerns regarding your invoice.  ° °Our billing staff will not be able to assist you with questions regarding bills from these companies. ° °You will be contacted with the lab results as soon as they are available. The fastest way to get your results is to activate your My Chart account. Instructions are located on the last page of this paperwork. If you have not heard from us regarding the results in 2 weeks, please contact this office. °  ° ° ° °

## 2019-07-07 ENCOUNTER — Telehealth: Payer: Self-pay | Admitting: *Deleted

## 2019-07-07 NOTE — Addendum Note (Signed)
Addended by: Rutherford Guys on: 07/07/2019 01:28 PM   Modules accepted: Orders

## 2019-07-07 NOTE — Telephone Encounter (Signed)
Critical results:  Potassium level- 2.1

## 2019-07-09 ENCOUNTER — Telehealth: Payer: Self-pay | Admitting: *Deleted

## 2019-07-09 NOTE — Telephone Encounter (Signed)
Unable to reach patient   Dr. Pamella Pert left a detailed message on 11-4 with instructions.

## 2019-07-09 NOTE — Telephone Encounter (Signed)
Dr. Pamella Pert called 11-4 left a detailed message

## 2019-07-09 NOTE — Telephone Encounter (Signed)
Pt called and stated that she does not have many potassium pills left and would need a new RX. Please advise

## 2019-07-13 ENCOUNTER — Telehealth: Payer: Self-pay | Admitting: Family Medicine

## 2019-07-13 NOTE — Telephone Encounter (Signed)
Copied from [place] [phone]. Topic: General - Other >> Jul 13, 2019 12:36 PM Mcneil, Ja-Kwan wrote: Reason for CRM: Pt stated she was told she would need to take potassium pills but her pharmacy has yet to receive a prescription for the medication.

## 2019-07-15 LAB — ALDOSTERONE + RENIN ACTIVITY W/ RATIO
ALDOS/RENIN RATIO: >253.3 — ABNORMAL HIGH (ref 0.0–30.0)
ALDOSTERONE: 42.3 ng/dL — ABNORMAL HIGH (ref 0.0–30.0)
Renin: <0.167 ng/mL/hr — ABNORMAL LOW (ref 0.167–5.380)

## 2019-07-15 LAB — CBC
Hematocrit: 36.9 % (ref 34.0–46.6)
Hemoglobin: 11.8 g/dL (ref 11.1–15.9)
MCH: 21.5 pg — ABNORMAL LOW (ref 26.6–33.0)
MCHC: 32 g/dL (ref 31.5–35.7)
MCV: 67 fL — ABNORMAL LOW (ref 79–97)
Platelets: 395 10*3/uL (ref 150–450)
RBC: 5.49 x10E6/uL — ABNORMAL HIGH (ref 3.77–5.28)
RDW: 16.5 % — ABNORMAL HIGH (ref 11.7–15.4)
WBC: 9.6 10*3/uL (ref 3.4–10.8)

## 2019-07-15 LAB — COMPREHENSIVE METABOLIC PANEL
ALT: 15 IU/L (ref 0–32)
AST: 18 IU/L (ref 0–40)
Albumin/Globulin Ratio: 1.3 (ref 1.2–2.2)
Albumin: 4.1 g/dL (ref 3.8–4.8)
Alkaline Phosphatase: 71 IU/L (ref 39–117)
BUN/Creatinine Ratio: 13 (ref 9–23)
BUN: 13 mg/dL (ref 6–20)
Bilirubin Total: 0.3 mg/dL (ref 0.0–1.2)
CO2: 30 mmol/L — ABNORMAL HIGH (ref 20–29)
Calcium: 8.9 mg/dL (ref 8.7–10.2)
Chloride: 92 mmol/L — ABNORMAL LOW (ref 96–106)
Creatinine, Ser: 0.99 mg/dL (ref 0.57–1.00)
GFR calc Af Amer: 84 mL/min/{1.73_m2} (ref >59)
GFR calc non Af Amer: 73 mL/min/{1.73_m2} (ref >59)
Globulin, Total: 3.2 g/dL (ref 1.5–4.5)
Glucose: 111 mg/dL — ABNORMAL HIGH (ref 65–99)
Potassium: 2.1 mmol/L — CL (ref 3.5–5.2)
Sodium: 141 mmol/L (ref 134–144)
Total Protein: 7.3 g/dL (ref 6.0–8.5)

## 2019-07-15 LAB — TSH: TSH: 2.39 u[IU]/mL (ref 0.450–4.500)

## 2019-07-20 ENCOUNTER — Other Ambulatory Visit: Payer: Self-pay

## 2019-07-20 ENCOUNTER — Ambulatory Visit (INDEPENDENT_AMBULATORY_CARE_PROVIDER_SITE_OTHER): Payer: Commercial Managed Care - PPO | Admitting: Family Medicine

## 2019-07-20 ENCOUNTER — Encounter: Payer: Self-pay | Admitting: Family Medicine

## 2019-07-20 VITALS — BP 184/116 | HR 90 | Temp 99.3°F | Resp 12 | Ht 68.0 in | Wt 215.0 lb

## 2019-07-20 DIAGNOSIS — I160 Hypertensive urgency: Secondary | ICD-10-CM | POA: Diagnosis not present

## 2019-07-20 DIAGNOSIS — N760 Acute vaginitis: Secondary | ICD-10-CM

## 2019-07-20 DIAGNOSIS — B9689 Other specified bacterial agents as the cause of diseases classified elsewhere: Secondary | ICD-10-CM

## 2019-07-20 DIAGNOSIS — E876 Hypokalemia: Secondary | ICD-10-CM | POA: Diagnosis not present

## 2019-07-20 DIAGNOSIS — N898 Other specified noninflammatory disorders of vagina: Secondary | ICD-10-CM | POA: Diagnosis not present

## 2019-07-20 DIAGNOSIS — E269 Hyperaldosteronism, unspecified: Secondary | ICD-10-CM

## 2019-07-20 LAB — POCT WET + KOH PREP
Trich by wet prep: ABSENT
Yeast by KOH: ABSENT
Yeast by wet prep: ABSENT

## 2019-07-20 MED ORDER — SPIRONOLACTONE 100 MG PO TABS: 200.0000 mg | ORAL_TABLET | Freq: Two times a day (BID) | ORAL | 3 refills | Status: DC

## 2019-07-20 MED ORDER — METRONIDAZOLE 500 MG PO TABS: 500.0000 mg | ORAL_TABLET | Freq: Two times a day (BID) | ORAL | 0 refills | Status: DC

## 2019-07-20 MED ORDER — POTASSIUM CHLORIDE CRYS ER 20 MEQ PO TBCR: 20.0000 meq | EXTENDED_RELEASE_TABLET | Freq: Two times a day (BID) | ORAL | 0 refills | Status: DC

## 2019-07-20 NOTE — Progress Notes (Signed)
11/16/20208:15 AM  Krista [name] 1980-11-27, 38 y.o., female [id number]  Chief Complaint  Patient presents with  . Follow-up    HPI:   Patient is a 38 y.o. female with past medical history significant for HTN and hypokalemia 2/2 hyperaldosteronism who presents today for followup  Seen 2 weeks ago Restarted spironolactone 100mg  BID, used to be on 200mg  BID Did not receive rx for KCL  She is overall doing well She denies any cp, palpitations, dizziness, vision changes, speech changes, weakness, cramps, cough, sob [facility] endo has dismissed her from clinic due to no shows She is also having a brownish vaginal discharge with odor and irritation Denies pelvic pain S/p BTL Reminds her of past BV  Lab Results  Component Value Date   CREATININE 0.99 07/06/2019   BUN 13 07/06/2019   NA 141 07/06/2019   K 2.1 (LL) 07/06/2019   CL 92 (L) 07/06/2019   CO2 30 (H) 07/06/2019   Ref Range & Units 2wk ago   ALDOSTERONE 0.0 - 30.0 ng/dL 42.3High    Comment: This test was developed and its performance characteristics  determined by [facility]. It has not been cleared or approved  by the Food and Drug Administration.   Renin 0.167 - 5.380 ng/mL/hr <0.167Low    Comment: This test was developed and its performance characteristics  determined by [facility]. It has not been cleared or approved  by the Food and Drug Administration.   ALDOS/RENIN RATIO 0.0 - 30.0 >253.3High    Comment:             Units:   ng/dL per ng/mL/hr  Resulting Agency  [facility]    Narrative    Depression screen [facility] 2/9 07/20/2019 07/06/2019  Decreased Interest 0 0  Down, Depressed, Hopeless 0 0  PHQ - 2 Score 0 0    Fall Risk  07/20/2019 07/06/2019  Falls in the past year? 0 0  Number falls in past yr: 0 0  Injury with Fall? 0 0     Allergies  Allergen Reactions  . Fluconazole     Lips swelling  . Sulfa Antibiotics Other (See Comments)    Eyes turn blood shot red    Prior to  Admission medications   Medication Sig Start Date End Date Taking? Authorizing Provider  spironolactone (ALDACTONE) 100 MG tablet Take 1 tablet (100 mg total) by mouth 2 (two) times daily. 07/06/19   13/10/2018, MD    Past Medical History:  Diagnosis Date  . Abnormal Pap smear 2010   Colpo & LEEP; LAST PAP 09/2011  . Adrenal gland disorder ([facility]) 2002  . Aldosteronism ([facility]) 2012  . BV (bacterial vaginosis) 2008  . CIN I (cervical intraepithelial neoplasia I) 2010  . Dermatitis 2007  . H/O bacterial infection   . H/O candidiasis   . H/O thalassemia   . H/O varicella   . Hx: UTI (urinary tract infection) 2010  . Hyperaldosteronism ([facility])   . Hypertension   . Hypokalemia 2007   IP Sept 2012  . Infection    UTI X 1  . Migraine   . Migraine headache    OTC  . Monilial vaginitis 2008  . Postpartum hypertension 07/06/10  . Pregnancy induced hypertension 2011  . Primary aldosteronism ([facility])   . Scoliosis   . Sickle cell trait ([facility])   . Thalassemia minor    CHRONIC  . Yeast infection   . Yeast vaginitis 2010    Past Surgical History:  Procedure Laterality Date  . BREAST REDUCTION SURGERY  Jan 2006  . CESAREAN SECTION  2005 2011 2014  . CESAREAN SECTION WITH BILATERAL TUBAL LIGATION Bilateral 05/05/2013   Procedure: REPEAT CESAREAN SECTION WITH BILATERAL TUBAL LIGATION,;  Surgeon: Hal MoralesVanessa P Haygood, MD;  Location: [facility];  Service: Obstetrics;  Laterality: Bilateral;  . DILATION AND CURETTAGE OF UTERUS  2003  . DILITATION & CURRETTAGE/HYSTROSCOPY WITH NOVASURE ABLATION N/A 03/22/2017   Procedure: DILATATION & CURETTAGE/HYSTEROSCOPY WITH NOVASURE ABLATION;  Surgeon: Olivia Mackieaavon, Richard, MD;  Location: [facility];  Service: Gynecology;  Laterality: N/A;  . TUBAL LIGATION    . WISDOM TOOTH EXTRACTION      Social History   Tobacco Use  . Smoking status: Never Smoker  . Smokeless tobacco: Never Used  Substance Use Topics  . Alcohol use: Yes    Comment: OCC    Family History   Problem Relation Age of Onset  . Thalassemia Mother   . Arthritis Mother   . Early death Mother [id number]  . Other Mother        GUILLON-BERRE SX; BLOOD CLOT  . Hypertension Father   . Heart attack Father   . Heart disease Father        MI  . Early death Father   . Kidney disease Father   . Breast cancer Maternal Aunt   . Breast cancer Maternal Grandmother   . Cancer Maternal Grandfather        Bone    ROS Per hpi  OBJECTIVE:  Today's Vitals   07/20/19 0759 07/20/19 0803  BP: (!) 182/109 (!) 184/116  Pulse: 90   Resp: 12   Temp: 99.3 F (37.4 C)   SpO2: 97%   Weight: 215 lb (97.5 kg)   Height: 5\' 8"  (1.727 m)    Body mass index is 32.69 kg/m.  BP Readings from Last 3 Encounters:  07/20/19 (!) 184/116  07/06/19 (!) 210/100  03/22/17 (!) 181/97    Physical Exam Vitals signs and nursing note reviewed.  Constitutional:      Appearance: She is well-developed.  HENT:     Head: Normocephalic and atraumatic.  Eyes:     General: No scleral icterus.    Conjunctiva/sclera: Conjunctivae normal.     Pupils: Pupils are equal, round, and reactive to light.  Neck:     Musculoskeletal: Neck supple.  Pulmonary:     Effort: Pulmonary effort is normal.  Skin:    General: Skin is warm and dry.  Neurological:     Mental Status: She is alert and oriented to person, place, and time.     Results for orders placed or performed in visit on 07/20/19 (from the past 24 hour(s))  POCT Wet + KOH Prep ([facility])     Status: Abnormal   Collection Time: 07/20/19  8:36 AM  Result Value Ref Range   Yeast by KOH Absent Absent   Yeast by wet prep Absent Absent   WBC by wet prep Moderate (A) Few   Clue Cells Wet Prep HPF POC Moderate (A) None   Trich by wet prep Absent Absent   Bacteria Wet Prep HPF POC Many (A) Few   Epithelial Cells By Principal FinancialWet Pref ([facility]) Moderate (A) None, Few, Too numerous to count   RBC,UR,HPF,POC None None RBC/hpf    No results found.   ASSESSMENT and PLAN  1.  Hypertensive urgency Increased spironolactone to previous dose of 200mg  BID, RTC precautions reviewed - Ambulatory referral to Endocrinology  2. Hypokalemia  rx for KCL 20mg  BID x 1 week given. K used to be controlled at higher dose of spironolactone. Recheck at next OV - Basic Metabolic Panel - Ambulatory referral to Endocrinology  3. Hyperaldosteronism ([place]) - Ambulatory referral to Endocrinology  4. Bacterial vaginosis Discussed supportive measures, new meds r/se/b and RTC precautions.   5. Vaginal discharge - POCT Wet + KOH Prep ([facility])  Other orders - spironolactone (ALDACTONE) 100 MG tablet; Take 2 tablets (200 mg total) by mouth 2 (two) times daily. - potassium chloride SA (KLOR-CON) 20 MEQ tablet; Take 1 tablet (20 mEq total) by mouth 2 (two) times daily. - metroNIDAZOLE (FLAGYL) 500 MG tablet; Take 1 tablet (500 mg total) by mouth 2 (two) times daily.  No follow-ups on file.    Rutherford Guys, MD Primary Care at [place], [place] [address] Ph.  [phone] Fax [phone]

## 2019-07-20 NOTE — Patient Instructions (Signed)
° ° ° °  If you have lab work done today you will be contacted with your lab results within the next 2 weeks.  If you have not heard from us then please contact us. The fastest way to get your results is to register for My Chart. ° ° °IF you received an x-ray today, you will receive an invoice from [facility]. Please contact [facility] at [phone] with questions or concerns regarding your invoice.  ° °IF you received labwork today, you will receive an invoice from [facility]. Please contact [facility] at [phone] with questions or concerns regarding your invoice.  ° °Our billing staff will not be able to assist you with questions regarding bills from these companies. ° °You will be contacted with the lab results as soon as they are available. The fastest way to get your results is to activate your My Chart account. Instructions are located on the last page of this paperwork. If you have not heard from us regarding the results in 2 weeks, please contact this office. °  ° ° ° °

## 2019-07-20 NOTE — Addendum Note (Signed)
Addended by: Elta Guadeloupe on: 07/20/2019 10:35 AM   Modules accepted: Orders

## 2019-07-23 ENCOUNTER — Other Ambulatory Visit: Payer: BLUE CROSS/BLUE SHIELD

## 2019-07-27 ENCOUNTER — Other Ambulatory Visit: Payer: BLUE CROSS/BLUE SHIELD

## 2019-07-28 ENCOUNTER — Inpatient Hospital Stay: Admission: RE | Admit: 2019-07-28 | Payer: BLUE CROSS/BLUE SHIELD | Source: Ambulatory Visit

## 2019-08-07 ENCOUNTER — Other Ambulatory Visit: Payer: Self-pay

## 2019-08-13 ENCOUNTER — Other Ambulatory Visit: Payer: Self-pay | Admitting: Family Medicine

## 2019-08-13 MED ORDER — POTASSIUM CHLORIDE CRYS ER 20 MEQ PO TBCR: 20.0000 meq | EXTENDED_RELEASE_TABLET | Freq: Two times a day (BID) | ORAL | 0 refills | Status: DC

## 2019-08-13 NOTE — Telephone Encounter (Signed)
Please notify the patient potassium is at the pharmacy.

## 2019-08-13 NOTE — Telephone Encounter (Signed)
Left a vm for patient to callback regarding message below 

## 2019-08-18 ENCOUNTER — Ambulatory Visit: Payer: Commercial Managed Care - PPO | Admitting: Family Medicine

## 2019-08-19 ENCOUNTER — Other Ambulatory Visit: Payer: Self-pay

## 2019-08-20 ENCOUNTER — Other Ambulatory Visit: Payer: Self-pay

## 2019-08-20 ENCOUNTER — Encounter: Payer: Self-pay | Admitting: Family Medicine

## 2019-08-20 ENCOUNTER — Ambulatory Visit (INDEPENDENT_AMBULATORY_CARE_PROVIDER_SITE_OTHER): Payer: Commercial Managed Care - PPO | Admitting: Family Medicine

## 2019-08-20 VITALS — BP 116/84 | HR 95 | Temp 98.3°F | Ht 68.0 in | Wt 211.3 lb

## 2019-08-20 DIAGNOSIS — N289 Disorder of kidney and ureter, unspecified: Secondary | ICD-10-CM

## 2019-08-20 DIAGNOSIS — I152 Hypertension secondary to endocrine disorders: Secondary | ICD-10-CM

## 2019-08-20 DIAGNOSIS — E876 Hypokalemia: Secondary | ICD-10-CM | POA: Diagnosis not present

## 2019-08-20 DIAGNOSIS — E269 Hyperaldosteronism, unspecified: Secondary | ICD-10-CM | POA: Diagnosis not present

## 2019-08-20 NOTE — Progress Notes (Signed)
12/17/20209:51 AM  Krista [name] 05/27/1981, 38 y.o., female [id number]  Chief Complaint  Patient presents with  . Hypertension    HPI:   Patient is a 38 y.o. female with past medical history significant for HTN and hypokalemia 2/2 hyperaldosteronism who presents today for followup  Last OV a month ago Increased spironolactone to 200mg  BID Started KCL supplementation for 1 week Referred to endo  Overall doing really well Having mild lightheadedness in the AM but resolves well thirst and fatigue resolved She has been off KCL for about 2 weeks Sees Dr Chalmers Cater on the 22nd She sent in [place] and ordered an [place] for adrenals  S/p BTL   Lab Results  Component Value Date   CREATININE 0.99 07/06/2019   BUN 13 07/06/2019   NA 141 07/06/2019   K 2.1 (LL) 07/06/2019   CL 92 (L) 07/06/2019   CO2 30 (H) 07/06/2019    Depression screen PHQ 2/9 07/20/2019 07/06/2019  Decreased Interest 0 0  Down, Depressed, Hopeless 0 0  PHQ - 2 Score 0 0    Fall Risk  08/20/2019 07/20/2019 07/06/2019  Falls in the past year? 0 0 0  Number falls in past yr: 0 0 0  Injury with Fall? 0 0 0     Allergies  Allergen Reactions  . Fluconazole     Lips swelling  . Sulfa Antibiotics Other (See Comments)    Eyes turn blood shot red    Prior to Admission medications   Medication Sig Start Date End Date Taking? Authorizing Provider  spironolactone (ALDACTONE) 100 MG tablet Take 2 tablets (200 mg total) by mouth 2 (two) times daily. 07/20/19   Rutherford Guys, MD    Past Medical History:  Diagnosis Date  . Abnormal Pap smear 2010   Colpo & LEEP; LAST PAP 09/2011  . Adrenal gland disorder ([place]) 2002  . Aldosteronism ([place]) 2012  . BV (bacterial vaginosis) 2008  . CIN I (cervical intraepithelial neoplasia I) 2010  . Dermatitis 2007  . H/O bacterial infection   . H/O candidiasis   . H/O thalassemia   . H/O varicella   . Hx: UTI (urinary tract infection) 2010  . Hyperaldosteronism ([place])   .  Hypertension   . Hypokalemia 2007   IP Sept 2012  . Infection    UTI X 1  . Migraine   . Migraine headache    OTC  . Monilial vaginitis 2008  . Postpartum hypertension 07/06/10  . Pregnancy induced hypertension 2011  . Primary aldosteronism ([place])   . Scoliosis   . Sickle cell trait ([place])   . Thalassemia minor    CHRONIC  . Yeast infection   . Yeast vaginitis 2010    Past Surgical History:  Procedure Laterality Date  . BREAST REDUCTION SURGERY  Jan 2006  . CESAREAN SECTION  2005 2011 2014  . CESAREAN SECTION WITH BILATERAL TUBAL LIGATION Bilateral 05/05/2013   Procedure: REPEAT CESAREAN SECTION WITH BILATERAL TUBAL LIGATION,;  Surgeon: Eldred Manges, MD;  Location: [facility];  Service: Obstetrics;  Laterality: Bilateral;  . DILATION AND CURETTAGE OF UTERUS  2003  . DILITATION & CURRETTAGE/HYSTROSCOPY WITH NOVASURE ABLATION N/A 03/22/2017   Procedure: DILATATION & CURETTAGE/HYSTEROSCOPY WITH NOVASURE ABLATION;  Surgeon: Brien Few, MD;  Location: [facility];  Service: Gynecology;  Laterality: N/A;  . TUBAL LIGATION    . WISDOM TOOTH EXTRACTION      Social History   Tobacco Use  . Smoking status: Never Smoker  .  Smokeless tobacco: Never Used  Substance Use Topics  . Alcohol use: Yes    Comment: OCC    Family History  Problem Relation Age of Onset  . Thalassemia Mother   . Arthritis Mother   . Early death Mother 42  . Other Mother        GUILLON-BERRE SX; BLOOD CLOT  . Hypertension Father   . Heart attack Father   . Heart disease Father        MI  . Early death Father   . Kidney disease Father   . Breast cancer Maternal Aunt   . Breast cancer Maternal Grandmother   . Cancer Maternal Grandfather        Bone    Review of Systems  Constitutional: Negative for chills and fever.  Respiratory: Negative for cough and shortness of breath.   Cardiovascular: Negative for chest pain, palpitations and leg swelling.  Gastrointestinal: Negative for abdominal pain, nausea  and vomiting.   Per hpi  OBJECTIVE:  Today's Vitals   08/20/19 0942  BP: 116/84  Pulse: 95  Temp: 98.3 F (36.8 C)  SpO2: 98%  Weight: 211 lb 4.8 oz (95.8 kg)  Height: 5\' 8"  (1.727 m)   Body mass index is 32.13 kg/m.   Physical Exam Vitals and nursing note reviewed.  Constitutional:      Appearance: She is well-developed.  HENT:     Head: Normocephalic and atraumatic.     Mouth/Throat:     Pharynx: No oropharyngeal exudate.  Eyes:     General: No scleral icterus.    Conjunctiva/sclera: Conjunctivae normal.     Pupils: Pupils are equal, round, and reactive to light.  Cardiovascular:     Rate and Rhythm: Normal rate and regular rhythm.     Heart sounds: Normal heart sounds. No murmur. No friction rub. No gallop.   Pulmonary:     Effort: Pulmonary effort is normal.     Breath sounds: Normal breath sounds. No wheezing or rales.  Musculoskeletal:     Cervical back: Neck supple.     Right lower leg: No edema.     Left lower leg: No edema.  Lymphadenopathy:     Cervical: No cervical adenopathy.  Skin:    General: Skin is warm and dry.  Neurological:     Mental Status: She is alert and oriented to person, place, and time.     No results found for this or any previous visit (from the past 24 hour(s)).  No results found.   ASSESSMENT and PLAN  1. Hypertension due to endocrine disorder Controlled. Cont current regime  2. Hyperaldosteronism (HCC) Will be establishing with endo soon  3. Hypokalemia Checking labs today, medications will be adjusted as needed.  - Basic Metabolic Panel  Return for early 2021 for pap.    2022, MD Primary Care at [facility] [address] [place], [place] Ph.  [phone] Fax [phone]

## 2019-08-20 NOTE — Patient Instructions (Signed)
° ° ° °  If you have lab work done today you will be contacted with your lab results within the next 2 weeks.  If you have not heard from us then please contact us. The fastest way to get your results is to register for My Chart. ° ° °IF you received an x-ray today, you will receive an invoice from [facility]. Please contact [facility] at [phone] with questions or concerns regarding your invoice.  ° °IF you received labwork today, you will receive an invoice from [facility]. Please contact [facility] at [phone] with questions or concerns regarding your invoice.  ° °Our billing staff will not be able to assist you with questions regarding bills from these companies. ° °You will be contacted with the lab results as soon as they are available. The fastest way to get your results is to activate your My Chart account. Instructions are located on the last page of this paperwork. If you have not heard from us regarding the results in 2 weeks, please contact this office. °  ° ° ° °

## 2019-08-21 LAB — BASIC METABOLIC PANEL
BUN/Creatinine Ratio: 15 (ref 9–23)
BUN: 20 mg/dL (ref 6–20)
CO2: 20 mmol/L (ref 20–29)
Calcium: 10 mg/dL (ref 8.7–10.2)
Chloride: 98 mmol/L (ref 96–106)
Creatinine, Ser: 1.37 mg/dL — ABNORMAL HIGH (ref 0.57–1.00)
GFR calc Af Amer: 56 mL/min/{1.73_m2} — ABNORMAL LOW (ref >59)
GFR calc non Af Amer: 49 mL/min/{1.73_m2} — ABNORMAL LOW (ref >59)
Glucose: 108 mg/dL — ABNORMAL HIGH (ref 65–99)
Potassium: 4.3 mmol/L (ref 3.5–5.2)
Sodium: 137 mmol/L (ref 134–144)

## 2019-08-21 NOTE — Addendum Note (Signed)
Addended by: Rutherford Guys on: 08/21/2019 10:58 AM   Modules accepted: Orders

## 2019-09-01 ENCOUNTER — Ambulatory Visit
Admission: RE | Admit: 2019-09-01 | Discharge: 2019-09-01 | Disposition: A | Discharge status: Home / Self Care | Payer: Self-pay | Source: Ambulatory Visit | Attending: Family Medicine | Admitting: Family Medicine

## 2019-09-01 DIAGNOSIS — I160 Hypertensive urgency: Secondary | ICD-10-CM

## 2019-09-01 DIAGNOSIS — E269 Hyperaldosteronism, unspecified: Secondary | ICD-10-CM

## 2019-09-10 ENCOUNTER — Telehealth: Payer: Self-pay | Admitting: Family Medicine

## 2019-09-10 NOTE — Telephone Encounter (Signed)
Patient will be seeing Dr.Ballen at [facility] this afternoon  Please fax lab results to [phone] have not received these yet. FR please fax ASAP

## 2019-09-11 NOTE — Telephone Encounter (Signed)
Call pt regarding lab request. There is no recent consent to fax any info on this pt

## 2019-09-14 ENCOUNTER — Ambulatory Visit: Payer: Commercial Managed Care - PPO | Admitting: Family Medicine

## 2019-09-17 ENCOUNTER — Ambulatory Visit: Payer: Commercial Managed Care - PPO | Admitting: Family Medicine

## 2019-09-21 ENCOUNTER — Other Ambulatory Visit: Payer: Self-pay | Admitting: Family Medicine

## 2019-09-21 NOTE — Telephone Encounter (Signed)
Requested medication (s) are due for refill today: no  Requested medication (s) are on the active medication list: no  Last refill:  07/20/2019  Future visit scheduled: yes  Notes to clinic:  no assigned protocol    Requested Prescriptions  Pending Prescriptions Disp Refills   metroNIDAZOLE (FLAGYL) 500 MG tablet [Pharmacy Med Name: METRONIDAZOLE 500MG  TABLETS] 14 tablet 0    Sig: TAKE 1 TABLET(500 MG) BY MOUTH TWICE DAILY      Off-Protocol Failed - 09/21/2019 10:13 AM      Failed - Medication not assigned to a protocol, review manually.      Passed - Valid encounter within last 12 months    Recent Outpatient Visits           1 month ago Hypertension due to endocrine disorder   Primary Care at 09/23/2019, [facility], MD   2 months ago Hypertensive urgency   [facility] at [facility], [facility], MD   2 months ago Hypertensive urgency   [facility] at [facility], [facility], MD       Future Appointments             In 1 week [facility], MD Primary Care at [place], [facility]

## 2019-09-22 NOTE — Telephone Encounter (Signed)
Patient needs to be seen, any available provider ok. thanks

## 2019-09-22 NOTE — Telephone Encounter (Signed)
Pt is wanting this med for reoccurring bv, is this ok?

## 2019-09-22 NOTE — Telephone Encounter (Signed)
LVM for pt. To call for appt for medication refill

## 2019-09-22 NOTE — Telephone Encounter (Signed)
Patient calling back about this request. 

## 2019-09-24 ENCOUNTER — Other Ambulatory Visit (HOSPITAL_COMMUNITY)
Admission: RE | Admit: 2019-09-24 | Discharge: 2019-09-24 | Disposition: A | Discharge status: Home / Self Care | Payer: Commercial Managed Care - PPO | Source: Ambulatory Visit | Attending: Family Medicine | Admitting: Family Medicine

## 2019-09-24 ENCOUNTER — Encounter: Payer: Self-pay | Admitting: Family Medicine

## 2019-09-24 ENCOUNTER — Other Ambulatory Visit: Payer: Self-pay

## 2019-09-24 ENCOUNTER — Ambulatory Visit (INDEPENDENT_AMBULATORY_CARE_PROVIDER_SITE_OTHER): Payer: Commercial Managed Care - PPO | Admitting: Family Medicine

## 2019-09-24 VITALS — BP 132/86 | HR 85 | Temp 97.8°F | Ht 68.0 in | Wt 216.8 lb

## 2019-09-24 DIAGNOSIS — N898 Other specified noninflammatory disorders of vagina: Secondary | ICD-10-CM

## 2019-09-24 DIAGNOSIS — Z01411 Encounter for gynecological examination (general) (routine) with abnormal findings: Secondary | ICD-10-CM

## 2019-09-24 DIAGNOSIS — Z124 Encounter for screening for malignant neoplasm of cervix: Secondary | ICD-10-CM | POA: Diagnosis not present

## 2019-09-24 DIAGNOSIS — Z113 Encounter for screening for infections with a predominantly sexual mode of transmission: Secondary | ICD-10-CM | POA: Diagnosis not present

## 2019-09-24 LAB — POCT URINALYSIS DIP (MANUAL ENTRY)
Bilirubin, UA: NEGATIVE
Blood, UA: NEGATIVE
Glucose, UA: NEGATIVE mg/dL
Ketones, POC UA: NEGATIVE mg/dL
Leukocytes, UA: NEGATIVE
Nitrite, UA: NEGATIVE
Protein Ur, POC: NEGATIVE mg/dL
Spec Grav, UA: 1.025 (ref 1.010–1.025)
Urobilinogen, UA: 0.2 E.U./dL
pH, UA: 6 (ref 5.0–8.0)

## 2019-09-24 NOTE — Patient Instructions (Signed)
° ° ° °  If you have lab work done today you will be contacted with your lab results within the next 2 weeks.  If you have not heard from us then please contact us. The fastest way to get your results is to register for My Chart. ° ° °IF you received an x-ray today, you will receive an invoice from [facility]. Please contact [facility] at [phone] with questions or concerns regarding your invoice.  ° °IF you received labwork today, you will receive an invoice from [facility]. Please contact [facility] at [phone] with questions or concerns regarding your invoice.  ° °Our billing staff will not be able to assist you with questions regarding bills from these companies. ° °You will be contacted with the lab results as soon as they are available. The fastest way to get your results is to activate your My Chart account. Instructions are located on the last page of this paperwork. If you have not heard from us regarding the results in 2 weeks, please contact this office. °  ° ° ° °

## 2019-09-24 NOTE — Progress Notes (Signed)
1/21/20219:30 AM  Krista [name] 01/11/1981, 39 y.o., female [id number]  Chief Complaint  Patient presents with  . Follow-up    did not finish the med for bv, needs refill. Other med got lost while moving    HPI:   Patient is a 39 y.o. female with past medical history significant for HTN and hypokalemia 2/2 hyperaldosteronismwho presents today for vaginal discharge/pap  G&Ps: 3304, had twins Pap: last pap 2014, normal however has h/o colpo and leep for CIN in 2013 STD: requesting screening BC : BTL Menses: irregular, about 7 days, not heavy, s/p endometrial ablation Mammogram: n/a FHX breast/ovarian cancer: none FHx colon cancer: none  Most Recent Immunizations  Administered Date(s) Administered  . Tdap 05/06/2013   Patient had BV in nov 2020 - unable to complete  Flagyl treatment as rx lost in a move.  Having scant discharge pinkish, mild fishy odor, no vaginal irritation, cramping or pelvic pain  Depression screen [facility] 2/9 09/24/2019 07/20/2019 07/06/2019  Decreased Interest 0 0 0  Down, Depressed, Hopeless 0 0 0  PHQ - 2 Score 0 0 0    Fall Risk  09/24/2019 08/20/2019 07/20/2019 07/06/2019  Falls in the past year? 0 0 0 0  Number falls in past yr: 0 0 0 0  Injury with Fall? 0 0 0 0     Allergies  Allergen Reactions  . Fluconazole     Lips swelling  . Sulfa Antibiotics Other (See Comments)    Eyes turn blood shot red    Prior to Admission medications   Medication Sig Start Date End Date Taking? Authorizing Provider  spironolactone (ALDACTONE) 100 MG tablet Take 2 tablets (200 mg total) by mouth 2 (two) times daily. 07/20/19   Myles Lipps, MD    Past Medical History:  Diagnosis Date  . Abnormal Pap smear 2010   Colpo & LEEP; LAST PAP 09/2011  . Adrenal gland disorder (HCC) 2002  . Aldosteronism (HCC) 2012  . BV (bacterial vaginosis) 2008  . CIN I (cervical intraepithelial neoplasia I) 2010  . Dermatitis 2007  . H/O bacterial infection   . H/O  candidiasis   . H/O thalassemia   . H/O varicella   . Hx: UTI (urinary tract infection) 2010  . Hyperaldosteronism (HCC)   . Hypertension   . Hypokalemia 2007   IP Sept 2012  . Infection    UTI X 1  . Migraine   . Migraine headache    OTC  . Monilial vaginitis 2008  . Postpartum hypertension 07/06/10  . Pregnancy induced hypertension 2011  . Primary aldosteronism (HCC)   . Scoliosis   . Sickle cell trait (HCC)   . Thalassemia minor    CHRONIC  . Yeast infection   . Yeast vaginitis 2010    Past Surgical History:  Procedure Laterality Date  . BREAST REDUCTION SURGERY  Jan 2006  . CESAREAN SECTION  2005 2011 2014  . CESAREAN SECTION WITH BILATERAL TUBAL LIGATION Bilateral 05/05/2013   Procedure: REPEAT CESAREAN SECTION WITH BILATERAL TUBAL LIGATION,;  Surgeon: Hal Morales, MD;  Location: [facility];  Service: Obstetrics;  Laterality: Bilateral;  . DILATION AND CURETTAGE OF UTERUS  2003  . DILITATION & CURRETTAGE/HYSTROSCOPY WITH NOVASURE ABLATION N/A 03/22/2017   Procedure: DILATATION & CURETTAGE/HYSTEROSCOPY WITH NOVASURE ABLATION;  Surgeon: Olivia Mackie, MD;  Location: [facility];  Service: Gynecology;  Laterality: N/A;  . TUBAL LIGATION    . WISDOM TOOTH EXTRACTION      Social  History   Tobacco Use  . Smoking status: Never Smoker  . Smokeless tobacco: Never Used  Substance Use Topics  . Alcohol use: Yes    Comment: OCC    Family History  Problem Relation Age of Onset  . Thalassemia Mother   . Arthritis Mother   . Early death Mother 36  . Other Mother        GUILLON-BERRE SX; BLOOD CLOT  . Hypertension Father   . Heart attack Father   . Heart disease Father        MI  . Early death Father   . Kidney disease Father   . Breast cancer Maternal Aunt   . Breast cancer Maternal Grandmother   . Cancer Maternal Grandfather        Bone    ROS Per hpi  OBJECTIVE:  Today's Vitals   09/24/19 0926  BP: 132/86  Pulse: 85  Temp: 97.8 F (36.6 C)  SpO2: 100%    Weight: 216 lb 12.8 oz (98.3 kg)  Height: 5\' 8"  (1.727 m)   Body mass index is 32.96 kg/m.   Physical Exam Vitals and nursing note reviewed. Exam conducted with a chaperone present.  Constitutional:      Appearance: She is well-developed.  HENT:     Head: Normocephalic and atraumatic.  Eyes:     General: No scleral icterus.    Conjunctiva/sclera: Conjunctivae normal.     Pupils: Pupils are equal, round, and reactive to light.  Pulmonary:     Effort: Pulmonary effort is normal.  Genitourinary:    Labia:        Right: No rash or lesion.        Left: No rash or lesion.      Vagina: Vaginal discharge (scant white/cream discharge) present. No erythema, bleeding or lesions.     Cervix: No cervical motion tenderness, discharge, friability or lesion.     Uterus: Not fixed and not tender.      Adnexa:        Right: No mass or tenderness.         Left: No mass or tenderness.    Musculoskeletal:     Cervical back: Neck supple.  Skin:    General: Skin is warm and dry.  Neurological:     Mental Status: She is alert and oriented to person, place, and time.     Results for orders placed or performed in visit on 09/24/19 (from the past 24 hour(s))  POCT urinalysis dipstick     Status: None   Collection Time: 09/24/19  9:38 AM  Result Value Ref Range   Color, UA yellow yellow   Clarity, UA clear clear   Glucose, UA negative negative mg/dL   Bilirubin, UA negative negative   Ketones, POC UA negative negative mg/dL   Spec Grav, UA 09/26/19 2.130 - 1.025   Blood, UA negative negative   pH, UA 6.0 5.0 - 8.0   Protein Ur, POC negative negative mg/dL   Urobilinogen, UA 0.2 0.2 or 1.0 E.U./dL   Nitrite, UA Negative Negative   Leukocytes, UA Negative Negative    No results found.   ASSESSMENT and PLAN  1. Pap smear for cervical cancer screening Routine HCM labs ordered. HCM reviewed/discussed. Anticipatory guidance regarding healthy weight, lifestyle and choices given.  -  Cytology - PAP  2. Vaginal discharge - POCT urinalysis dipstick - Cervicovaginal ancillary only  3. Screen for STD (sexually transmitted disease) - HIV antibody (with reflex) -  RPR  Return in about 6 months (around 03/23/2020). HTN    Rutherford Guys, MD Primary Care at [place], [place] [address] Ph.  [phone] Fax [phone]

## 2019-09-25 LAB — HIV ANTIBODY (ROUTINE TESTING W REFLEX): HIV Screen 4th Generation wRfx: NONREACTIVE

## 2019-09-25 LAB — CERVICOVAGINAL ANCILLARY ONLY
Bacterial Vaginitis (gardnerella): NEGATIVE
Candida Glabrata: NEGATIVE
Candida Vaginitis: NEGATIVE
Comment: NEGATIVE
Comment: NEGATIVE
Comment: NEGATIVE

## 2019-09-25 LAB — RPR: RPR Ser Ql: NONREACTIVE

## 2019-09-29 LAB — CYTOLOGY - PAP
Chlamydia: NEGATIVE
Comment: NEGATIVE
Comment: NEGATIVE
Comment: NORMAL
Diagnosis: NEGATIVE
Neisseria Gonorrhea: NEGATIVE
Trichomonas: NEGATIVE

## 2019-10-02 ENCOUNTER — Ambulatory Visit: Payer: Commercial Managed Care - PPO | Admitting: Family Medicine

## 2019-10-05 ENCOUNTER — Encounter: Payer: Self-pay | Admitting: Radiology

## 2020-01-18 ENCOUNTER — Other Ambulatory Visit: Payer: Self-pay | Admitting: Family Medicine

## 2020-01-18 MED ORDER — SPIRONOLACTONE 100 MG PO TABS: 200.0000 mg | ORAL_TABLET | Freq: Two times a day (BID) | ORAL | 0 refills | Status: DC

## 2020-01-18 NOTE — Telephone Encounter (Signed)
Requested medication (s) are due for refill today -yes  Requested medication (s) are on the active medication list -yes  Future visit scheduled -yes  Last refill: 6 months ago- patient may not be current with medication  Notes to clinic: patient passed RF protocol- but high dose warning present- sent for review  Requested Prescriptions  Pending Prescriptions Disp Refills   spironolactone (ALDACTONE) 100 MG tablet 360 tablet 0    Sig: Take 2 tablets (200 mg total) by mouth 2 (two) times daily.      Cardiovascular: Diuretics - Aldosterone Antagonist Failed - 01/18/2020 10:03 AM      Failed - Cr in normal range and within 360 days    Creat  Date Value Ref Range Status  10/23/2012 0.59 0.50 - 1.10 mg/dL Final   Creatinine, Ser  Date Value Ref Range Status  08/20/2019 1.37 (H) 0.57 - 1.00 mg/dL Final   Creatinine, Urine  Date Value Ref Range Status  05/05/2013 36.72 mg/dL Final          Passed - K in normal range and within 360 days    Potassium  Date Value Ref Range Status  08/20/2019 4.3 3.5 - 5.2 mmol/L Final          Passed - Na in normal range and within 360 days    Sodium  Date Value Ref Range Status  08/20/2019 137 134 - 144 mmol/L Final          Passed - Last BP in normal range    BP Readings from Last 1 Encounters:  09/24/19 132/86          Passed - Valid encounter within last 6 months    Recent Outpatient Visits           3 months ago Pap smear for cervical cancer screening   [facility], [facility], MD   5 months ago Hypertension due to endocrine disorder   [facility] at [facility], [facility], MD   6 months ago Hypertensive urgency   [facility] at [facility], [facility], MD   6 months ago Hypertensive urgency   [facility] at [facility], [facility], MD       Future Appointments             In 2 months Myles Lipps, MD Primary Care at [place], [facility]                Requested Prescriptions  Pending  Prescriptions Disp Refills   spironolactone (ALDACTONE) 100 MG tablet 360 tablet 0    Sig: Take 2 tablets (200 mg total) by mouth 2 (two) times daily.      Cardiovascular: Diuretics - Aldosterone Antagonist Failed - 01/18/2020 10:03 AM      Failed - Cr in normal range and within 360 days    Creat  Date Value Ref Range Status  10/23/2012 0.59 0.50 - 1.10 mg/dL Final   Creatinine, Ser  Date Value Ref Range Status  08/20/2019 1.37 (H) 0.57 - 1.00 mg/dL Final   Creatinine, Urine  Date Value Ref Range Status  05/05/2013 36.72 mg/dL Final          Passed - K in normal range and within 360 days    Potassium  Date Value Ref Range Status  08/20/2019 4.3 3.5 - 5.2 mmol/L Final          Passed - Na in normal range and within 360 days  Sodium  Date Value Ref Range Status  08/20/2019 137 134 - 144 mmol/L Final          Passed - Last BP in normal range    BP Readings from Last 1 Encounters:  09/24/19 132/86          Passed - Valid encounter within last 6 months    Recent Outpatient Visits           3 months ago Pap smear for cervical cancer screening   [facility] at [facility], Lilia Argue, MD   5 months ago Hypertension due to endocrine disorder   [facility] at [facility], Lilia Argue, MD   6 months ago Hypertensive urgency   [facility] at [facility], Lilia Argue, MD   6 months ago Hypertensive urgency   [facility] at [facility], Lilia Argue, MD       Future Appointments             In 2 months Rutherford Guys, MD Primary Care at [place], [facility]

## 2020-01-18 NOTE — Telephone Encounter (Signed)
spironolactone (ALDACTONE) 100 MG tablet  Pt ins has changed and must have 90 day supplies going forward  [id number] [place], [place] - [address] AT [place] [address] Phone:  [phone]  Fax:  [phone]     Note supply quantity chg and pharmacy also  [facility]

## 2020-03-24 ENCOUNTER — Ambulatory Visit: Payer: Commercial Managed Care - PPO | Admitting: Family Medicine

## 2020-04-07 ENCOUNTER — Ambulatory Visit: Payer: Commercial Managed Care - PPO | Admitting: Family Medicine

## 2020-04-19 ENCOUNTER — Other Ambulatory Visit: Payer: Self-pay

## 2020-04-19 DIAGNOSIS — I152 Hypertension secondary to endocrine disorders: Secondary | ICD-10-CM

## 2020-04-19 MED ORDER — SPIRONOLACTONE 100 MG PO TABS: 200.0000 mg | ORAL_TABLET | Freq: Two times a day (BID) | ORAL | 0 refills | Status: DC

## 2020-04-21 ENCOUNTER — Telehealth: Payer: Self-pay | Admitting: Family Medicine

## 2020-04-21 ENCOUNTER — Other Ambulatory Visit: Payer: Self-pay

## 2020-04-21 ENCOUNTER — Encounter: Payer: Self-pay | Admitting: Family Medicine

## 2020-04-21 ENCOUNTER — Ambulatory Visit: Payer: Commercial Managed Care - PPO | Admitting: Family Medicine

## 2020-04-21 VITALS — BP 150/94 | HR 88 | Temp 98.1°F | Resp 15 | Ht 68.0 in | Wt 214.8 lb

## 2020-04-21 DIAGNOSIS — E269 Hyperaldosteronism, unspecified: Secondary | ICD-10-CM | POA: Diagnosis not present

## 2020-04-21 DIAGNOSIS — I152 Hypertension secondary to endocrine disorders: Secondary | ICD-10-CM | POA: Diagnosis not present

## 2020-04-21 DIAGNOSIS — N898 Other specified noninflammatory disorders of vagina: Secondary | ICD-10-CM

## 2020-04-21 DIAGNOSIS — N760 Acute vaginitis: Secondary | ICD-10-CM

## 2020-04-21 DIAGNOSIS — B9689 Other specified bacterial agents as the cause of diseases classified elsewhere: Secondary | ICD-10-CM

## 2020-04-21 LAB — POCT WET + KOH PREP
Trich by wet prep: ABSENT
Yeast by KOH: ABSENT
Yeast by wet prep: ABSENT

## 2020-04-21 LAB — BASIC METABOLIC PANEL
BUN/Creatinine Ratio: 14 (ref 9–23)
BUN: 14 mg/dL (ref 6–20)
CO2: 23 mmol/L (ref 20–29)
Calcium: 9.6 mg/dL (ref 8.7–10.2)
Chloride: 102 mmol/L (ref 96–106)
Creatinine, Ser: 1.01 mg/dL — ABNORMAL HIGH (ref 0.57–1.00)
GFR calc Af Amer: 81 mL/min/{1.73_m2} (ref >59)
GFR calc non Af Amer: 70 mL/min/{1.73_m2} (ref >59)
Glucose: 101 mg/dL — ABNORMAL HIGH (ref 65–99)
Potassium: 4.4 mmol/L (ref 3.5–5.2)
Sodium: 139 mmol/L (ref 134–144)

## 2020-04-21 MED ORDER — METRONIDAZOLE 500 MG PO TABS: 500.0000 mg | ORAL_TABLET | Freq: Two times a day (BID) | ORAL | 0 refills | Status: DC

## 2020-04-21 MED ORDER — LOSARTAN POTASSIUM 25 MG PO TABS: 25.0000 mg | ORAL_TABLET | Freq: Every day | ORAL | 0 refills | Status: DC

## 2020-04-21 NOTE — Progress Notes (Signed)
Depression screening negative.

## 2020-04-21 NOTE — Telephone Encounter (Signed)
Medication Refill - Medication: Aldactone 90-day supply  Has the patient contacted their pharmacy? Yes.   Pharmacy stated patient only has 15 day supply (Agent: If no, request that the patient contact the pharmacy for the refill.) (Agent: If yes, when and what did the pharmacy advise?)  Preferred Pharmacy (with phone number or street name): [facility]  Agent: Please be advised that RX refills may take up to 3 business days. We ask that you follow-up with your pharmacy.

## 2020-04-21 NOTE — Progress Notes (Signed)
8/19/202110:38 AM  Krista [name] 03/10/1981, 39 y.o., female [id number]  Chief Complaint  Patient presents with  . Hypertension    pt has been taking BP at home averages about 130/85 at home   . Prediabetes    pt had a A1c test at 6% pt was told she should start low carb low sugar diet to help reduce this number and lose weight  . Vaginitis    pt having some irritation and slight discharge, pt reports she often gets yeast infections started about 4 days ago no odor as of yet.     HPI:   Patient is a 39 y.o. female with past medical history significant for HTN and hypokalemia 2/2 hyperaldosteronism, prediabetes who presents today for routine followup  Last OV Jan 2021 - referred to endo  She is overall doing well Sees Dr Talmage Nap in Jan, seeing her once a year, with labs twice a year, she reports a month ago her K was normal but was found to have prediabetes Her next endo Jan 2021  She is not checking BP anymore Taking spironolactone 200mg  BID She is having vaginal discharge, yellowish, no odor, mild irritation, reminds her previous BV infection Declines STD testing Has had covid vaccine Declines flu vaccine  Depression screen [facility] 2/9 09/24/2019 07/20/2019 07/06/2019  Decreased Interest 0 0 0  Down, Depressed, Hopeless 0 0 0  PHQ - 2 Score 0 0 0    Fall Risk  04/21/2020 09/24/2019 08/20/2019 07/20/2019 07/06/2019  Falls in the past year? 0 0 0 0 0  Number falls in past yr: 0 0 0 0 0  Injury with Fall? 0 0 0 0 0  Risk for fall due to : No Fall Risks - - - -  Follow up Falls evaluation completed - - - -     Allergies  Allergen Reactions  . Fluconazole     Lips swelling  . Sulfa Antibiotics Other (See Comments)    Eyes turn blood shot red    Prior to Admission medications   Medication Sig Start Date End Date Taking? Authorizing Provider  spironolactone (ALDACTONE) 100 MG tablet Take 2 tablets (200 mg total) by mouth 2 (two) times daily. 04/19/20   04/21/20, MD     Past Medical History:  Diagnosis Date  . Abnormal Pap smear 2010   Colpo & LEEP; LAST PAP 09/2011  . Adrenal gland disorder (HCC) 2002  . Aldosteronism (HCC) 2012  . BV (bacterial vaginosis) 2008  . CIN I (cervical intraepithelial neoplasia I) 2010  . Dermatitis 2007  . H/O bacterial infection   . H/O candidiasis   . H/O thalassemia   . H/O varicella   . Hx: UTI (urinary tract infection) 2010  . Hyperaldosteronism (HCC)   . Hypertension   . Hypokalemia 2007   IP Sept 2012  . Infection    UTI X 1  . Migraine   . Migraine headache    OTC  . Monilial vaginitis 2008  . Postpartum hypertension 07/06/10  . Pregnancy induced hypertension 2011  . Primary aldosteronism (HCC)   . Scoliosis   . Sickle cell trait (HCC)   . Thalassemia minor    CHRONIC  . Yeast infection   . Yeast vaginitis 2010    Past Surgical History:  Procedure Laterality Date  . BREAST REDUCTION SURGERY  Jan 2006  . CESAREAN SECTION  2005 2011 2014  . CESAREAN SECTION WITH BILATERAL TUBAL LIGATION Bilateral 05/05/2013   Procedure:  REPEAT CESAREAN SECTION WITH BILATERAL TUBAL LIGATION,;  Surgeon: Hal Morales, MD;  Location: [facility];  Service: Obstetrics;  Laterality: Bilateral;  . DILATION AND CURETTAGE OF UTERUS  2003  . DILITATION & CURRETTAGE/HYSTROSCOPY WITH NOVASURE ABLATION N/A 03/22/2017   Procedure: DILATATION & CURETTAGE/HYSTEROSCOPY WITH NOVASURE ABLATION;  Surgeon: Olivia Mackie, MD;  Location: [facility];  Service: Gynecology;  Laterality: N/A;  . TUBAL LIGATION    . WISDOM TOOTH EXTRACTION      Social History   Tobacco Use  . Smoking status: Never Smoker  . Smokeless tobacco: Never Used  Substance Use Topics  . Alcohol use: Yes    Comment: OCC    Family History  Problem Relation Age of Onset  . Thalassemia Mother   . Arthritis Mother   . Early death Mother 38  . Other Mother        GUILLON-BERRE SX; BLOOD CLOT  . Hypertension Father   . Heart attack Father   . Heart disease  Father        MI  . Early death Father   . Kidney disease Father   . Breast cancer Maternal Aunt   . Breast cancer Maternal Grandmother   . Cancer Maternal Grandfather        Bone    Review of Systems  Constitutional: Negative for chills, diaphoresis, fever and malaise/fatigue.  Respiratory: Negative for cough and shortness of breath.   Cardiovascular: Negative for chest pain, palpitations and leg swelling.  Gastrointestinal: Negative for abdominal pain, nausea and vomiting.  Genitourinary: Negative for dysuria and hematuria.  Neurological: Negative for dizziness and headaches.  Endo/Heme/Allergies: Negative for polydipsia.     OBJECTIVE:  Today's Vitals   04/21/20 1024 04/21/20 1027  BP: (!) 155/98 (!) 148/98  Pulse: 88   Resp: 15   Temp: 98.1 F (36.7 C)   TempSrc: Temporal   SpO2: 97%   Weight: 214 lb 12.8 oz (97.4 kg)   Height: 5\' 8"  (1.727 m)    Body mass index is 32.66 kg/m.  Wt Readings from Last 3 Encounters:  04/21/20 214 lb 12.8 oz (97.4 kg)  09/24/19 216 lb 12.8 oz (98.3 kg)  08/20/19 211 lb 4.8 oz (95.8 kg)    Physical Exam Vitals and nursing note reviewed.  Constitutional:      Appearance: She is well-developed.  HENT:     Head: Normocephalic and atraumatic.     Mouth/Throat:     Pharynx: No oropharyngeal exudate.  Eyes:     General: No scleral icterus.    Extraocular Movements: Extraocular movements intact.     Conjunctiva/sclera: Conjunctivae normal.     Pupils: Pupils are equal, round, and reactive to light.  Cardiovascular:     Rate and Rhythm: Normal rate and regular rhythm.     Heart sounds: Normal heart sounds. No murmur heard.  No friction rub. No gallop.   Pulmonary:     Effort: Pulmonary effort is normal.     Breath sounds: Normal breath sounds. No wheezing, rhonchi or rales.  Musculoskeletal:     Cervical back: Neck supple.  Skin:    General: Skin is warm and dry.  Neurological:     Mental Status: She is alert and oriented  to person, place, and time.     Results for orders placed or performed in visit on 04/21/20 (from the past 24 hour(s))  POCT Wet + KOH Prep     Status: Abnormal   Collection Time: 04/21/20 10:55 AM  Result Value Ref Range   Yeast by KOH Absent Absent   Yeast by wet prep Absent Absent   WBC by wet prep Few Few   Clue Cells Wet Prep HPF POC Moderate (A) None   Trich by wet prep Absent Absent   Bacteria Wet Prep HPF POC Moderate (A) Few   Epithelial Cells By [employer] ([facility]) Moderate (A) None, Few, Too numerous to count   RBC,UR,HPF,POC None None RBC/hpf    No results found.   ASSESSMENT and PLAN  1. Hypertension due to endocrine disorder Above goal, discussed with Dr Talmage Nap, starting losartan 25mg  daily, reviewed r/se/b.   2. Hyperaldosteronism (HCC) - Basic Metabolic Panel  3. Bacterial vaginosis Discussed supportive measures, new meds r/se/b and RTC precautions.  4. Vaginal discharge - POCT Wet + KOH Prep  Other orders - metroNIDAZOLE (FLAGYL) 500 MG tablet; Take 1 tablet (500 mg total) by mouth 2 (two) times daily. - losartan (COZAAR) 25 MG tablet; Take 1 tablet (25 mg total) by mouth daily.  Return in about 4 weeks (around 05/19/2020) for BP.    05/21/2020, MD Primary Care at [facility] [address] [place], [place] Ph.  [phone] Fax [phone]

## 2020-04-21 NOTE — Patient Instructions (Signed)
° ° ° °  If you have lab work done today you will be contacted with your lab results within the next 2 weeks.  If you have not heard from us then please contact us. The fastest way to get your results is to register for My Chart. ° ° °IF you received an x-ray today, you will receive an invoice from [facility]. Please contact [facility] at [phone] with questions or concerns regarding your invoice.  ° °IF you received labwork today, you will receive an invoice from [facility]. Please contact [facility] at [phone] with questions or concerns regarding your invoice.  ° °Our billing staff will not be able to assist you with questions regarding bills from these companies. ° °You will be contacted with the lab results as soon as they are available. The fastest way to get your results is to activate your My Chart account. Instructions are located on the last page of this paperwork. If you have not heard from us regarding the results in 2 weeks, please contact this office. °  ° ° ° °

## 2020-05-10 ENCOUNTER — Ambulatory Visit: Payer: Commercial Managed Care - PPO | Admitting: Family Medicine

## 2020-05-17 ENCOUNTER — Ambulatory Visit: Payer: Commercial Managed Care - PPO | Admitting: Family Medicine

## 2020-06-02 ENCOUNTER — Ambulatory Visit: Payer: Commercial Managed Care - PPO | Admitting: Family Medicine

## 2020-08-04 ENCOUNTER — Other Ambulatory Visit (HOSPITAL_COMMUNITY)
Admission: RE | Admit: 2020-08-04 | Discharge: 2020-08-04 | Disposition: A | Discharge status: Home / Self Care | Payer: Commercial Managed Care - PPO | Source: Ambulatory Visit | Attending: Family Medicine | Admitting: Family Medicine

## 2020-08-04 ENCOUNTER — Encounter: Payer: Self-pay | Admitting: Family Medicine

## 2020-08-04 ENCOUNTER — Ambulatory Visit: Payer: Commercial Managed Care - PPO | Admitting: Family Medicine

## 2020-08-04 ENCOUNTER — Other Ambulatory Visit: Payer: Self-pay

## 2020-08-04 VITALS — BP 160/120 | HR 88 | Temp 98.8°F | Ht 68.0 in | Wt 219.0 lb

## 2020-08-04 DIAGNOSIS — N898 Other specified noninflammatory disorders of vagina: Secondary | ICD-10-CM | POA: Diagnosis not present

## 2020-08-04 DIAGNOSIS — I152 Hypertension secondary to endocrine disorders: Secondary | ICD-10-CM | POA: Diagnosis not present

## 2020-08-04 LAB — POCT URINALYSIS DIP (MANUAL ENTRY)
Bilirubin, UA: NEGATIVE
Blood, UA: NEGATIVE
Glucose, UA: NEGATIVE mg/dL
Ketones, POC UA: NEGATIVE mg/dL
Leukocytes, UA: NEGATIVE
Nitrite, UA: NEGATIVE
Protein Ur, POC: NEGATIVE mg/dL
Spec Grav, UA: 1.02 (ref 1.010–1.025)
Urobilinogen, UA: 1 E.U./dL
pH, UA: 7 (ref 5.0–8.0)

## 2020-08-04 LAB — POCT WET + KOH PREP
Trich by wet prep: ABSENT
Yeast by KOH: ABSENT
Yeast by wet prep: ABSENT

## 2020-08-04 MED ORDER — METRONIDAZOLE 500 MG PO TABS: 500.0000 mg | ORAL_TABLET | Freq: Two times a day (BID) | ORAL | 0 refills | Status: DC

## 2020-08-04 MED ORDER — LOSARTAN POTASSIUM 25 MG PO TABS: 25.0000 mg | ORAL_TABLET | Freq: Every day | ORAL | 1 refills | Status: DC

## 2020-08-04 NOTE — Patient Instructions (Addendum)
Take BP at home daily.   Bacterial Vaginosis  Bacterial vaginosis is an infection of the vagina. It happens when too many normal germs (healthy bacteria) grow in the vagina. This infection puts you at risk for infections from sex (STIs). Treating this infection can lower your risk for some STIs. You should also treat this if you are pregnant. It can cause your baby to be born early. Follow these instructions at home: Medicines  Take over-the-counter and prescription medicines only as told by your doctor.  Take or use your antibiotic medicine as told by your doctor. Do not stop taking or using it even if you start to feel better. General instructions  If you your sexual partner is a woman, tell her that you have this infection. She needs to get treatment if she has symptoms. If you have a female partner, he does not need to be treated.  During treatment: ? Avoid sex. ? Do not douche. ? Avoid alcohol as told. ? Avoid breastfeeding as told.  Drink enough fluid to keep your pee (urine) clear or pale yellow.  Keep your vagina and butt (rectum) clean. ? Wash the area with warm water every day. ? Wipe from front to back after you use the toilet.  Keep all follow-up visits as told by your doctor. This is important. Preventing this condition  Do not douche.  Use only warm water to wash around your vagina.  Use protection when you have sex. This includes: ? Latex condoms. ? Dental dams.  Limit how many people you have sex with. It is best to only have sex with the same person (be monogamous).  Get tested for STIs. Have your partner get tested.  Wear underwear that is cotton or lined with cotton.  Avoid tight pants and pantyhose. This is most important in summer.  Do not use any products that have nicotine or tobacco in them. These include cigarettes and e-cigarettes. If you need help quitting, ask your doctor.  Do not use illegal drugs.  Limit how much alcohol you  drink. Contact a doctor if:  Your symptoms do not get better, even after you are treated.  You have more discharge or pain when you pee (urinate).  You have a fever.  You have pain in your belly (abdomen).  You have pain with sex.  Your bleed from your vagina between periods. Summary  This infection happens when too many germs (bacteria) grow in the vagina.  Treating this condition can lower your risk for some infections from sex (STIs).  You should also treat this if you are pregnant. It can cause early (premature) birth.  Do not stop taking or using your antibiotic medicine even if you start to feel better. This information is not intended to replace advice given to you by your health care provider. Make sure you discuss any questions you have with your health care provider. Document Revised: 08/02/2017 Document Reviewed: 05/05/2016 Elsevier Patient Education  [employer].  If you have lab work done today you will be contacted with your lab results within the next 2 weeks.  If you have not heard from [place] then please contact us. The fastest way to get your results is to register for My Chart.   IF you received an x-ray today, you will receive an invoice from [facility]. Please contact [facility] at [phone] with questions or concerns regarding your invoice.   IF you received labwork today, you will receive an invoice from [employer].  Please contact [facility] at [phone] with questions or concerns regarding your invoice.   Our billing staff will not be able to assist you with questions regarding bills from these companies.  You will be contacted with the lab results as soon as they are available. The fastest way to get your results is to activate your My Chart account. Instructions are located on the last page of this paperwork. If you have not heard from [place] regarding the results in 2 weeks, please contact this office.

## 2020-08-04 NOTE — Progress Notes (Signed)
12/2/20213:37 PM  Krista [name] [birth year], 39 y.o., female [id number]  Chief Complaint  Patient presents with  . Vaginal Discharge    and dicomfort and odor x2 days - no otc   . Hypertension    hyperaldosteroism- has follow up w/ endocrine, took losartan for 30 days but didnt have a chance to follow up     HPI:   Patient is a 39 y.o. female with past medical history significant for HTN and hypokalemia 2/2 hyperaldosteronism, prediabetes who presents today for routine follow-up.  HTN Has not taken sprinolactone this morning Ran out of losartan months ago BP Readings from Last 3 Encounters:  08/04/20 (!) 160/120  04/21/20 (!) 150/94  09/24/19 132/86    Vaginal discharge Gets bacterial infections a few times a year Has been going on for 2 days Treated last in August Denies urinary symptoms LMP: 07/26/20 Contraception: tubes tied   Depression screen [facility] 2/9 08/04/2020 09/24/2019 07/20/2019  Decreased Interest 0 0 0  Down, Depressed, Hopeless 0 0 0  PHQ - 2 Score 0 0 0    Fall Risk  04/21/2020 09/24/2019 08/20/2019 07/20/2019 07/06/2019  Falls in the past year? 0 0 0 0 0  Number falls in past yr: 0 0 0 0 0  Injury with Fall? 0 0 0 0 0  Risk for fall due to : No Fall Risks - - - -  Follow up Falls evaluation completed - - - -     Allergies  Allergen Reactions  . Fluconazole     Lips swelling  . Sulfa Antibiotics Other (See Comments)    Eyes turn blood shot red    Prior to Admission medications   Medication Sig Start Date End Date Taking? Authorizing Provider  spironolactone (ALDACTONE) 100 MG tablet Take 2 tablets (200 mg total) by mouth 2 (two) times daily. 04/19/20  Yes Myles Lipps, MD  losartan (COZAAR) 25 MG tablet Take 1 tablet (25 mg total) by mouth daily. Patient not taking: Reported on 08/04/2020 04/21/20   Myles Lipps, MD    Past Medical History:  Diagnosis Date  . Abnormal Pap smear 2010   Colpo & LEEP; LAST PAP 09/2011  . Adrenal gland  disorder (HCC) 2002  . Aldosteronism (HCC) 2012  . BV (bacterial vaginosis) 2008  . CIN I (cervical intraepithelial neoplasia I) 2010  . Dermatitis 2007  . H/O bacterial infection   . H/O candidiasis   . H/O thalassemia   . H/O varicella   . Hx: UTI (urinary tract infection) 2010  . Hyperaldosteronism (HCC)   . Hypertension   . Hypokalemia 2007   IP Sept 2012  . Infection    UTI X 1  . Migraine   . Migraine headache    OTC  . Monilial vaginitis 2008  . Postpartum hypertension 07/06/10  . Pregnancy induced hypertension 2011  . Primary aldosteronism (HCC)   . Scoliosis   . Sickle cell trait (HCC)   . Thalassemia minor    CHRONIC  . Yeast infection   . Yeast vaginitis 2010    Past Surgical History:  Procedure Laterality Date  . BREAST REDUCTION SURGERY  Jan 2006  . CESAREAN SECTION  2005 2011 2014  . CESAREAN SECTION WITH BILATERAL TUBAL LIGATION Bilateral 05/05/2013   Procedure: REPEAT CESAREAN SECTION WITH BILATERAL TUBAL LIGATION,;  Surgeon: Hal Morales, MD;  Location: [facility];  Service: Obstetrics;  Laterality: Bilateral;  . DILATION AND CURETTAGE OF UTERUS  2003  .  DILITATION & CURRETTAGE/HYSTROSCOPY WITH NOVASURE ABLATION N/A 03/22/2017   Procedure: DILATATION & CURETTAGE/HYSTEROSCOPY WITH NOVASURE ABLATION;  Surgeon: Olivia Mackie, MD;  Location: [facility];  Service: Gynecology;  Laterality: N/A;  . TUBAL LIGATION    . WISDOM TOOTH EXTRACTION      Social History   Tobacco Use  . Smoking status: Never Smoker  . Smokeless tobacco: Never Used  Substance Use Topics  . Alcohol use: Yes    Comment: OCC    Family History  Problem Relation Age of Onset  . Thalassemia Mother   . Arthritis Mother   . Early death Mother 55  . Other Mother        GUILLON-BERRE SX; BLOOD CLOT  . Hypertension Father   . Heart attack Father   . Heart disease Father        MI  . Early death Father   . Kidney disease Father   . Breast cancer Maternal Aunt   . Breast cancer  Maternal Grandmother   . Cancer Maternal Grandfather        Bone    Review of Systems  Constitutional: Negative for chills, fever and malaise/fatigue.  Eyes: Negative for blurred vision and double vision.  Respiratory: Negative for cough, shortness of breath and wheezing.   Cardiovascular: Negative for chest pain, palpitations and leg swelling.  Gastrointestinal: Negative for abdominal pain, blood in stool, constipation, diarrhea, heartburn, nausea and vomiting.  Genitourinary: Negative for dysuria, flank pain, frequency, hematuria and urgency.  Musculoskeletal: Negative for back pain and joint pain.  Skin: Negative for rash.  Neurological: Negative for dizziness, weakness and headaches.     OBJECTIVE:  Today's Vitals   08/04/20 1352 08/04/20 1422  BP: (!) 186/125 (!) 160/120  Pulse: 88   Temp: 98.8 F (37.1 C)   SpO2: 99%   Weight: 219 lb (99.3 kg)   Height: 5\' 8"  (1.727 m)    Body mass index is 33.3 kg/m.   Physical Exam Constitutional:      General: She is not in acute distress.    Appearance: Normal appearance. She is not ill-appearing.  HENT:     Head: Normocephalic.  Cardiovascular:     Rate and Rhythm: Normal rate and regular rhythm.     Pulses: Normal pulses.     Heart sounds: Normal heart sounds. No murmur heard.  No friction rub. No gallop.   Pulmonary:     Effort: Pulmonary effort is normal. No respiratory distress.     Breath sounds: Normal breath sounds. No stridor. No wheezing, rhonchi or rales.  Abdominal:     General: Bowel sounds are normal.     Palpations: Abdomen is soft.     Tenderness: There is no abdominal tenderness. There is no right CVA tenderness or left CVA tenderness.  Musculoskeletal:     Right lower leg: No edema.     Left lower leg: No edema.  Skin:    General: Skin is warm and dry.  Neurological:     Mental Status: She is alert and oriented to person, place, and time.  Psychiatric:        Mood and Affect: Mood normal.         Behavior: Behavior normal.     Results for orders placed or performed in visit on 08/04/20 (from the past 24 hour(s))  POCT Wet + KOH Prep     Status: Abnormal   Collection Time: 08/04/20  2:15 PM  Result Value Ref Range   Yeast  by KOH Absent Absent   Yeast by wet prep Absent Absent   WBC by wet prep Few Few   Clue Cells Wet Prep HPF POC Moderate (A) None   Trich by wet prep Absent Absent   Bacteria Wet Prep HPF POC Moderate (A) Few   Epithelial Cells By [employer] ([facility]) Few None, Few, Too numerous to count   RBC,UR,HPF,POC None None RBC/hpf  POCT urinalysis dipstick     Status: None   Collection Time: 08/04/20  2:19 PM  Result Value Ref Range   Color, UA yellow yellow   Clarity, UA clear clear   Glucose, UA negative negative mg/dL   Bilirubin, UA negative negative   Ketones, POC UA negative negative mg/dL   Spec Grav, UA 0.272 5.366 - 1.025   Blood, UA negative negative   pH, UA 7.0 5.0 - 8.0   Protein Ur, POC negative negative mg/dL   Urobilinogen, UA 1.0 0.2 or 1.0 E.U./dL   Nitrite, UA Negative Negative   Leukocytes, UA Negative Negative    No results found.   ASSESSMENT and PLAN  Problem List Items Addressed This Visit      Cardiovascular and Mediastinum   HTN (hypertension) - Primary (Chronic)   Relevant Medications   losartan (COZAAR) 25 MG tablet Will follow up in 2 weeks with BP Educated on r/se/b Discussed the importance of taking medication daily    Other Visit Diagnoses    Vaginal discharge       Relevant Medications   metroNIDAZOLE (FLAGYL) 500 MG tablet bid x 7days Discussed r/se/b   Other Relevant Orders   POCT Wet + KOH Prep (Completed)   POCT urinalysis dipstick (Completed)   Urine cytology ancillary only      Return in about 2 weeks (around 08/18/2020) for BP follow up.   Macario Carls Ellena Kamen, FNP-BC Primary Care at [facility] [address] [place], [place] [address] Ph.  [phone] Fax [phone]

## 2020-08-05 LAB — URINE CYTOLOGY ANCILLARY ONLY
Chlamydia: NEGATIVE
Comment: NEGATIVE
Comment: NORMAL
Neisseria Gonorrhea: NEGATIVE

## 2020-08-18 ENCOUNTER — Ambulatory Visit: Payer: Commercial Managed Care - PPO | Admitting: Family Medicine

## 2020-08-25 ENCOUNTER — Ambulatory Visit: Payer: Commercial Managed Care - PPO | Admitting: Family Medicine

## 2020-08-29 ENCOUNTER — Encounter: Payer: Self-pay | Admitting: Family Medicine

## 2020-08-29 ENCOUNTER — Ambulatory Visit: Payer: Commercial Managed Care - PPO | Admitting: Family Medicine

## 2020-08-29 ENCOUNTER — Other Ambulatory Visit: Payer: Self-pay

## 2020-08-29 VITALS — BP 150/109 | HR 75 | Temp 98.0°F | Ht 68.0 in | Wt 220.0 lb

## 2020-08-29 DIAGNOSIS — I152 Hypertension secondary to endocrine disorders: Secondary | ICD-10-CM | POA: Diagnosis not present

## 2020-08-29 DIAGNOSIS — D573 Sickle-cell trait: Secondary | ICD-10-CM | POA: Diagnosis not present

## 2020-08-29 DIAGNOSIS — N898 Other specified noninflammatory disorders of vagina: Secondary | ICD-10-CM

## 2020-08-29 LAB — POCT WET + KOH PREP
Trich by wet prep: ABSENT
Yeast by KOH: ABSENT
Yeast by wet prep: ABSENT

## 2020-08-29 MED ORDER — LOSARTAN POTASSIUM 25 MG PO TABS: 50.0000 mg | ORAL_TABLET | Freq: Every day | ORAL | 1 refills | Status: DC

## 2020-08-29 NOTE — Patient Instructions (Addendum)
Take 2 spirolactone for a total of 50mg  per day  Please buy BP cuff when able   Hypertension, Adult Hypertension is another name for high blood pressure. High blood pressure forces your heart to work harder to pump blood. This can cause problems over time. There are two numbers in a blood pressure reading. There is a top number (systolic) over a bottom number (diastolic). It is best to have a blood pressure that is below 120/80. Healthy choices can help lower your blood pressure, or you may need medicine to help lower it. What are the causes? The cause of this condition is not known. Some conditions may be related to high blood pressure. What increases the risk?  Smoking.  Having type 2 diabetes mellitus, high cholesterol, or both.  Not getting enough exercise or physical activity.  Being overweight.  Having too much fat, sugar, calories, or salt (sodium) in your diet.  Drinking too much alcohol.  Having long-term (chronic) kidney disease.  Having a family history of high blood pressure.  Age. Risk increases with age.  Race. You may be at higher risk if you are African American.  Gender. Men are at higher risk than women before age 67. After age 38, women are at higher risk than men.  Having obstructive sleep apnea.  Stress. What are the signs or symptoms?  High blood pressure may not cause symptoms. Very high blood pressure (hypertensive crisis) may cause: ? Headache. ? Feelings of worry or nervousness (anxiety). ? Shortness of breath. ? Nosebleed. ? A feeling of being sick to your stomach (nausea). ? Throwing up (vomiting). ? Changes in how you see. ? Very bad chest pain. ? Seizures. How is this treated?  This condition is treated by making healthy lifestyle changes, such as: ? Eating healthy foods. ? Exercising more. ? Drinking less alcohol.  Your health care provider may prescribe medicine if lifestyle changes are not enough to get your blood pressure  under control, and if: ? Your top number is above 130. ? Your bottom number is above 80.  Your personal target blood pressure may vary. Follow these instructions at home: Eating and drinking   If told, follow the DASH eating plan. To follow this plan: ? Fill one half of your plate at each meal with fruits and vegetables. ? Fill one fourth of your plate at each meal with whole grains. Whole grains include whole-wheat pasta, brown rice, and whole-grain bread. ? Eat or drink low-fat dairy products, such as skim milk or low-fat yogurt. ? Fill one fourth of your plate at each meal with low-fat (lean) proteins. Low-fat proteins include fish, chicken without skin, eggs, beans, and tofu. ? Avoid fatty meat, cured and processed meat, or chicken with skin. ? Avoid pre-made or processed food.  Eat less than 1,500 mg of salt each day.  Do not drink alcohol if: ? Your doctor tells you not to drink. ? You are pregnant, may be pregnant, or are planning to become pregnant.  If you drink alcohol: ? Limit how much you use to:  0-1 drink a day for women.  0-2 drinks a day for men. ? Be aware of how much alcohol is in your drink. In the [place] one drink equals one 12 oz bottle of beer (355 mL), one 5 oz glass of wine (148 mL), or one 1 oz glass of hard liquor (44 mL). Lifestyle   Work with your doctor to stay at a healthy weight or to lose weight.  Ask your doctor what the best weight is for you.  Get at least 30 minutes of exercise most days of the week. This may include walking, swimming, or biking.  Get at least 30 minutes of exercise that strengthens your muscles (resistance exercise) at least 3 days a week. This may include lifting weights or doing Pilates.  Do not use any products that contain nicotine or tobacco, such as cigarettes, e-cigarettes, and chewing tobacco. If you need help quitting, ask your doctor.  Check your blood pressure at home as told by your doctor.  Keep all follow-up  visits as told by your doctor. This is important. Medicines  Take over-the-counter and prescription medicines only as told by your doctor. Follow directions carefully.  Do not skip doses of blood pressure medicine. The medicine does not work as well if you skip doses. Skipping doses also puts you at risk for problems.  Ask your doctor about side effects or reactions to medicines that you should watch for. Contact a doctor if you:  Think you are having a reaction to the medicine you are taking.  Have headaches that keep coming back (recurring).  Feel dizzy.  Have swelling in your ankles.  Have trouble with your vision. Get help right away if you:  Get a very bad headache.  Start to feel mixed up (confused).  Feel weak or numb.  Feel faint.  Have very bad pain in your: ? Chest. ? Belly (abdomen).  Throw up more than once.  Have trouble breathing. Summary  Hypertension is another name for high blood pressure.  High blood pressure forces your heart to work harder to pump blood.  For most people, a normal blood pressure is less than 120/80.  Making healthy choices can help lower blood pressure. If your blood pressure does not get lower with healthy choices, you may need to take medicine. This information is not intended to replace advice given to you by your health care provider. Make sure you discuss any questions you have with your health care provider. Document Revised: 04/30/2018 Document Reviewed: 04/30/2018 Elsevier Patient Education  [employer].    If you have lab work done today you will be contacted with your lab results within the next 2 weeks.  If you have not heard from [place] then please contact us. The fastest way to get your results is to register for My Chart.   IF you received an x-ray today, you will receive an invoice from [facility]. Please contact [facility] at [phone] with questions or concerns regarding your invoice.    IF you received labwork today, you will receive an invoice from [place]. Please contact [facility] at [phone] with questions or concerns regarding your invoice.   Our billing staff will not be able to assist you with questions regarding bills from these companies.  You will be contacted with the lab results as soon as they are available. The fastest way to get your results is to activate your My Chart account. Instructions are located on the last page of this paperwork. If you have not heard from [place] regarding the results in 2 weeks, please contact this office.

## 2020-08-29 NOTE — Progress Notes (Signed)
12/27/20214:41 PM  Krista [name] 12-07-1980, 39 y.o., female [id number]  Chief Complaint  Patient presents with  . Hypertension    Follow up   . Vaginal Discharge    Follow up - reports same symptoms after flagyl treatment    HPI:   Patient is a 39 y.o. female with past medical history significant for hyperaldosteronism who presents today for HTN follow up.  HTN Losartan 62m daily spirolactone 1068mdaily Sees endocrinology for hyperaldosteronism She still does not have a BP cuff at home No cholesterol labs noted since 2010.  BP Readings from Last 3 Encounters:  08/29/20 (!) 150/109  08/04/20 (!) 160/120  04/21/20 (!) 150/94   Was recently treated for BV Feels like discharge is still present   [facility] 08/29/2020 08/04/2020 09/24/2019  Decreased Interest 0 0 0  Down, Depressed, Hopeless 0 0 0  PHQ - 2 Score 0 0 0    Fall Risk  08/29/2020 04/21/2020 09/24/2019 08/20/2019 07/20/2019  Falls in the past year? 0 0 0 0 0  Number falls in past yr: 0 0 0 0 0  Injury with Fall? 0 0 0 0 0  Risk for fall due to : - No Fall Risks - - -  Follow up Falls evaluation completed Falls evaluation completed - - -     Allergies  Allergen Reactions  . Fluconazole     Lips swelling  . Sulfa Antibiotics Other (See Comments)    Eyes turn blood shot red    Prior to Admission medications   Medication Sig Start Date End Date Taking? Authorizing Provider  losartan (COZAAR) 25 MG tablet Take 1 tablet (25 mg total) by mouth daily. 08/04/20  Yes Krista [name], KeLaurita QuintFNP  spironolactone (ALDACTONE) 100 MG tablet Take 2 tablets (200 mg total) by mouth 2 (two) times daily. 04/19/20  Yes SaJacelyn PiIrLilia ArgueMD  metroNIDAZOLE (FLAGYL) 500 MG tablet Take 1 tablet (500 mg total) by mouth 2 (two) times daily. Patient not taking: Reported on 08/29/2020 08/04/20   Krista [name], KeLaurita QuintFNP    Past Medical History:  Diagnosis Date  . Abnormal Pap smear 2010   Colpo & LEEP; LAST PAP  09/2011  . Adrenal gland disorder (HCM[place]2002  . Aldosteronism (HC[place]2012  . BV (bacterial vaginosis) 2008  . CIN I (cervical intraepithelial neoplasia I) 2010  . Dermatitis 2007  . H/O bacterial infection   . H/O candidiasis   . H/O thalassemia   . H/O varicella   . Hx: UTI (urinary tract infection) 2010  . Hyperaldosteronism ([facility]  . Hypertension   . Hypokalemia 2007   IP Sept 2012  . Infection    UTI X 1  . Migraine   . Migraine headache    OTC  . Monilial vaginitis 2008  . Postpartum hypertension 07/06/10  . Pregnancy induced hypertension 2011  . Primary aldosteronism (HCGolinda  . Scoliosis   . Sickle cell trait (HC[facility]  . Thalassemia minor    CHRONIC  . Yeast infection   . Yeast vaginitis 2010    Past Surgical History:  Procedure Laterality Date  . BREAST REDUCTION SURGERY  Jan 2006  . CESAREAN SECTION  2005 2011 2014  . CESAREAN SECTION WITH BILATERAL TUBAL LIGATION Bilateral 05/05/2013   Procedure: REPEAT CESAREAN SECTION WITH BILATERAL TUBAL LIGATION,;  Surgeon: VaEldred MangesMD;  [facility];  Service: Obstetrics;  Laterality: Bilateral;  . DILATION AND CURETTAGE OF UTERUS  2003  . [place] N/A 03/22/2017   Procedure: DILATATION & CURETTAGE/HYSTEROSCOPY WITH NOVASURE ABLATION;  Surgeon: Brien Few, MD;  Location: [place] [facility];  Service: Gynecology;  Laterality: N/A;  . TUBAL LIGATION    . WISDOM TOOTH EXTRACTION      Social History   Tobacco Use  . Smoking status: Never Smoker  . Smokeless tobacco: Never Used  Substance Use Topics  . Alcohol use: Yes    Comment: OCC    Family History  Problem Relation Age of Onset  . Thalassemia Mother   . Arthritis Mother   . Early death Mother 78  . Other Mother        GUILLON-BERRE SX; BLOOD CLOT  . Hypertension Father   . Heart attack Father   . Heart disease Father        MI  . Early death Father   . Kidney disease Father   . Breast cancer Maternal Aunt    . Breast cancer Maternal Grandmother   . Cancer Maternal Grandfather        Bone    Review of Systems  Constitutional: Negative for chills, fever and malaise/fatigue.  Eyes: Negative for blurred vision and double vision.  Respiratory: Negative for cough, shortness of breath and wheezing.   Cardiovascular: Negative for chest pain, palpitations and leg swelling.  Gastrointestinal: Negative for abdominal pain, nausea and vomiting.  Genitourinary: Negative for dysuria, flank pain, frequency, hematuria and urgency.       Vaginal discharge  Musculoskeletal: Negative for back pain and joint pain.  Skin: Negative for rash.  Neurological: Negative for dizziness, weakness and headaches.     OBJECTIVE:  Today's Vitals   08/29/20 1416 08/29/20 1427  BP: (!) 162/103 (!) 150/109  Pulse: 75   Temp: 98 F (36.7 C)   SpO2: 99%   Weight: 220 lb (99.8 kg)   Height: '5\' 8"'  (1.727 m)    Body mass index is 33.45 kg/m.   Physical Exam Constitutional:      General: She is not in acute distress.    Appearance: Normal appearance. She is not ill-appearing.  HENT:     Head: Normocephalic.  Cardiovascular:     Rate and Rhythm: Normal rate and regular rhythm.     Pulses: Normal pulses.     Heart sounds: Normal heart sounds. No murmur heard. No friction rub. No gallop.   Pulmonary:     Effort: Pulmonary effort is normal. No respiratory distress.     Breath sounds: Normal breath sounds. No stridor. No wheezing, rhonchi or rales.  Abdominal:     General: Bowel sounds are normal.     Palpations: Abdomen is soft.     Tenderness: There is no abdominal tenderness.  Musculoskeletal:     Right lower leg: No edema.     Left lower leg: No edema.  Skin:    General: Skin is warm and dry.  Neurological:     Mental Status: She is alert and oriented to person, place, and time.  Psychiatric:        Mood and Affect: Mood normal.        Behavior: Behavior normal.     Results for orders placed or  performed in visit on 08/29/20 (from the past 24 hour(s))  POCT Wet + KOH Prep     Status: Abnormal   Collection Time: 08/29/20  3:37 PM  Result Value Ref Range   Yeast by KOH Absent Absent  Yeast by wet prep Absent Absent   WBC by wet prep Moderate (A) Few   Clue Cells Wet Prep HPF POC Few (A) None   Trich by wet prep Absent Absent   Bacteria Wet Prep HPF POC Many (A) Few   Epithelial Cells By [employer] Pref ([facility]) Few None, Few, Too numerous to count   RBC,UR,HPF,POC None None RBC/hpf    No results found.   ASSESSMENT and PLAN  Problem List Items Addressed This Visit      Cardiovascular and Mediastinum   HTN (hypertension) (Chronic)   Relevant Medications   losartan (COZAAR) 25 MG tablet increased from 25 to 44m per day   Other Relevant Orders   Lipid Panel   CMP14+EGFR   Hemoglobin A1c  BP not at goal BP: (!) 150/109  Discussed the importance of having a BP cuff at home If continue to have issues with BP management discussed the need for a referral to cardiology Will follow up with lab results     Other   Sickle cell trait (HCC)   Relevant Orders   CBC   Iron, TIBC and Ferritin Panel    Other Visit Diagnoses    Vaginal discharge    -  Primary   Relevant Orders   POCT Wet + KOH Prep (Completed)     Return in about 2 weeks (around 09/12/2020).   KHuston FoleyJust, FNP-BC [facility] [place] [id number].  [phone] [phone]

## 2020-08-30 ENCOUNTER — Other Ambulatory Visit: Payer: Self-pay | Admitting: Family Medicine

## 2020-08-30 DIAGNOSIS — R7303 Prediabetes: Secondary | ICD-10-CM

## 2020-08-30 DIAGNOSIS — E782 Mixed hyperlipidemia: Secondary | ICD-10-CM

## 2020-08-30 LAB — CMP14+EGFR
ALT: 11 IU/L (ref 0–32)
AST: 11 IU/L (ref 0–40)
Albumin/Globulin Ratio: 1.3 (ref 1.2–2.2)
Albumin: 4.4 g/dL (ref 3.8–4.8)
Alkaline Phosphatase: 71 IU/L (ref 44–121)
BUN/Creatinine Ratio: 14 (ref 9–23)
BUN: 14 mg/dL (ref 6–20)
Bilirubin Total: <0.2 mg/dL (ref 0.0–1.2)
CO2: 22 mmol/L (ref 20–29)
Calcium: 9.7 mg/dL (ref 8.7–10.2)
Chloride: 101 mmol/L (ref 96–106)
Creatinine, Ser: 1 mg/dL (ref 0.57–1.00)
GFR calc Af Amer: 82 mL/min/{1.73_m2} (ref >59)
GFR calc non Af Amer: 71 mL/min/{1.73_m2} (ref >59)
Globulin, Total: 3.4 g/dL (ref 1.5–4.5)
Glucose: 89 mg/dL (ref 65–99)
Potassium: 4.1 mmol/L (ref 3.5–5.2)
Sodium: 138 mmol/L (ref 134–144)
Total Protein: 7.8 g/dL (ref 6.0–8.5)

## 2020-08-30 LAB — CBC
Hematocrit: 38.2 % (ref 34.0–46.6)
Hemoglobin: 11.9 g/dL (ref 11.1–15.9)
MCH: 20.9 pg — ABNORMAL LOW (ref 26.6–33.0)
MCHC: 31.2 g/dL — ABNORMAL LOW (ref 31.5–35.7)
MCV: 67 fL — ABNORMAL LOW (ref 79–97)
Platelets: 421 10*3/uL (ref 150–450)
RBC: 5.69 x10E6/uL — ABNORMAL HIGH (ref 3.77–5.28)
RDW: 16.1 % — ABNORMAL HIGH (ref 11.7–15.4)
WBC: 9.6 10*3/uL (ref 3.4–10.8)

## 2020-08-30 LAB — IRON,TIBC AND FERRITIN PANEL
Ferritin: 134 ng/mL (ref 15–150)
Iron Saturation: 14 % — ABNORMAL LOW (ref 15–55)
Iron: 38 ug/dL (ref 27–159)
Total Iron Binding Capacity: 269 ug/dL (ref 250–450)
UIBC: 231 ug/dL (ref 131–425)

## 2020-08-30 LAB — LIPID PANEL
Chol/HDL Ratio: 5.5 ratio — ABNORMAL HIGH (ref 0.0–4.4)
Cholesterol, Total: 252 mg/dL — ABNORMAL HIGH (ref 100–199)
HDL: 46 mg/dL (ref >39)
LDL Chol Calc (NIH): 175 mg/dL — ABNORMAL HIGH (ref 0–99)
Triglycerides: 167 mg/dL — ABNORMAL HIGH (ref 0–149)
VLDL Cholesterol Cal: 31 mg/dL (ref 5–40)

## 2020-08-30 LAB — HEMOGLOBIN A1C
Est. average glucose Bld gHb Est-mCnc: 137 mg/dL
Hgb A1c MFr Bld: 6.4 % — ABNORMAL HIGH (ref 4.8–5.6)

## 2020-08-30 MED ORDER — ROSUVASTATIN CALCIUM 10 MG PO TABS: 10.0000 mg | ORAL_TABLET | Freq: Every day | ORAL | 3 refills | Status: DC

## 2020-08-30 MED ORDER — METFORMIN HCL 500 MG PO TABS: 500.0000 mg | ORAL_TABLET | Freq: Every day | ORAL | 3 refills | Status: DC

## 2020-08-30 NOTE — Telephone Encounter (Signed)
Pt wants to see nutritionist if youll sign order thank you

## 2020-09-13 ENCOUNTER — Ambulatory Visit: Payer: Commercial Managed Care - PPO | Admitting: Family Medicine

## 2020-09-14 ENCOUNTER — Encounter: Payer: Self-pay | Admitting: Family Medicine

## 2020-09-27 ENCOUNTER — Ambulatory Visit: Payer: Commercial Managed Care - PPO | Admitting: Dietician

## 2020-10-11 ENCOUNTER — Other Ambulatory Visit: Payer: Self-pay

## 2020-10-11 ENCOUNTER — Encounter: Payer: Commercial Managed Care - PPO | Attending: Family Medicine | Admitting: Dietician

## 2020-10-11 ENCOUNTER — Encounter: Payer: Self-pay | Admitting: Dietician

## 2020-10-11 DIAGNOSIS — I152 Hypertension secondary to endocrine disorders: Secondary | ICD-10-CM | POA: Diagnosis not present

## 2020-10-11 NOTE — Progress Notes (Signed)
Medical Nutrition Therapy  Appointment Start time:  1125  Appointment End time:  1230  Primary concerns today: Improving health, HTN, blood cholesterol, prediabetes  Referral diagnosis: R73.03 Prediabetes, E78.2 Mixed Hyperlipidemia Preferred learning style: No preference indicated Learning readiness: Ready   NUTRITION ASSESSMENT   Anthropometrics  Ht - 5'8" Wt -  222.7 lbs Body mass index is 33.86 kg/m.   Clinical Medical Hx: Prediabetes, hyperlipidemia, HTN, hyperaldostyeronism Medications: Spironolactone, Losartan Labs: A1c - 6.4, TC - 252, LDL - 175, TGL - 167, BP-145/98 Notable Signs/Symptoms: N/A  Lifestyle & Dietary Hx Pt reports not wanting to have to take a lot of medication to lower their blood pressure, and would like to improve their health via nutrition and lifestlye. Pt did three years in college for criminal justice. 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 Pt reports having hyperaldosteronism, states their father had uncontrollable high blood pressure as well and believes father had undiagnosed hyperaldosteronism. Pt was admitted to ER due to hypokalemia due to HTN. Pt states they have been able to keep potassium in range now.  Pt reports having to force themselves to drink during the day, and states that they get thirsty when their potassium is low. Pt is taking spironolactone for HTN, which can lower their serum potassium. Pt reports previous education on high potassium foods. Pt reports not being eligible for surgery to remove their adrenal glands. Pt states they would be interested in getting the surgery if they became eligible. Pt reports skipping meals, sometimes missing breakfast or dinner.   Estimated daily fluid intake: 64 oz Supplements: N/A Sleep: Regular Stress / self-care: Stress related to health conditions Current average weekly physical activity: ADLs  24-Hr Dietary  Recall First Meal: Oatmeal with raisins and apples, water (6:30 am) Snack: none Second Meal: Shrimp salad, water (12:00 pm) Snack: none Third Meal: [employer] double cheeseburger, fries, 4 nuggets, 1/2 pineapple mango  lemonade(7:00 pm)  Snack: none Beverages: Water, lemonade.   NUTRITION DIAGNOSIS  NB-1.1 Food and nutrition-related knowledge deficit As related to hyperlipidemia, prediabetes.  As evidenced by Total Cholesterol of 252 mg/dL, E0F of 6.4, dietary recall high in saturated fats and inconsistent carb intake.   NUTRITION INTERVENTION  Nutrition education (E-1) on the following topics:  . Educated patient on the pathophysiology of diabetes. This includes why our bodies need circulating blood sugar, the relationship between insulin and blood sugar, and the results of insulin resistance and/or pancreatic insufficiency on the development of diabetes. Educated patient on factors that contribute to elevation of blood sugars, such as stress, illness, injury,and food choices. Discussed the role that physical activity plays in lowering blood sugar. Educate patient on the three main macronutrients. Protein, fats, and carbohydrates. Discussed how each of these macronutrients affect blood sugar levels, especially carbohydrate, and the importance of eating a consistent amount of carbohydrate throughout the day. Educated patient on carbohydrate counting, 15g of carbohydrate equals one carb choice.  . Educate pt on factors that can elevate LDL cholesterol, including high dietary intake of saturated fats. Educate pt on identifying sources of saturated fats, and how to make alternative food choices to lower saturated fat intake. Educate pt on the role of soluble fiber in binding to cholesterol in the GI tract an eliminating it from the body. Educate pt on dietary sources of soluble fiber. Educate pt on the potential dietary causes of hypertriglyceridemia.Educate on the role of elevated LDL,total cholesterol,  and  triglycerides on cardiovascular health. Educate pt on the role of physical activity in lowering LDL and increasing HDL cholesterol. . Educated patient on the role of sodium and hypertension. Educated patient on label reading to monitor sodium intake. Recommended patient consume 1,500 mg of sodium per day.   Handouts Provided Include   Diabetes Label Reading Tips Nutrition Care Manual  Cholesterol Lowering Therapy Nutrition Care Manual  Hypertension Nutrition Therapy Nutrition Care Manual  Carb Counting For People With Diabetes Nutrition Care Manual  Learning Style & Readiness for Change Teaching method utilized: Visual & Auditory  Demonstrated degree of understanding via: Teach Back  Barriers to learning/adherence to lifestyle change: none  Goals Established by Pt  Review your handouts for additional information!  Remember that saturated fats come mostly from animal products like meats and dairy. Choose leaner meats and low fat dairy!  Begin to pay attention to the sodium content of your foods. Work towards getting to 1,500 mg a day.  Aim to get 150 minutes of physical activity each week, spread out to be about 30 minutes for 5 days a week   MONITORING & EVALUATION Dietary intake, and weekly physical activity in 1 month.  Next Steps  Patient is to follow up with dietitian in 1 month.

## 2020-10-11 NOTE — Patient Instructions (Signed)
Review your handouts for additional information!  Remember that saturated fats come mostly from animal products like meats and dairy. Choose leaner meats and low fat dairy!  Begin to pay attention to the sodium content of your foods. Work towards getting to 1,500 mg a day.  Aim to get 150 minutes of physical activity each week, spread out to be about 30 minutes for 5 days a week

## 2020-10-14 ENCOUNTER — Encounter: Payer: Self-pay | Admitting: Family Medicine

## 2020-10-14 ENCOUNTER — Other Ambulatory Visit: Payer: Self-pay

## 2020-10-14 ENCOUNTER — Ambulatory Visit (INDEPENDENT_AMBULATORY_CARE_PROVIDER_SITE_OTHER): Payer: Commercial Managed Care - PPO | Admitting: Family Medicine

## 2020-10-14 VITALS — BP 142/88 | HR 86 | Temp 98.0°F | Resp 16 | Ht 68.0 in | Wt 220.0 lb

## 2020-10-14 DIAGNOSIS — R300 Dysuria: Secondary | ICD-10-CM

## 2020-10-14 LAB — POCT URINALYSIS DIP (MANUAL ENTRY)
Bilirubin, UA: NEGATIVE
Blood, UA: NEGATIVE
Glucose, UA: NEGATIVE mg/dL
Nitrite, UA: NEGATIVE
Protein Ur, POC: NEGATIVE mg/dL
Spec Grav, UA: 1.025 (ref 1.010–1.025)
Urobilinogen, UA: 1 E.U./dL
pH, UA: 7 (ref 5.0–8.0)

## 2020-10-14 NOTE — Progress Notes (Signed)
Patient ID: Krista [name], female    DOB: 29-Jun-1981  Age: 40 y.o. MRN: [id number]  Chief Complaint  Patient presents with  . Dysuria    Pt reports when urinating she has pain at the end of her stream, notes she does get bacterial infections but this feels different. Notes a strong odor as well.     Subjective:   For the past several days the patient has been having some dysuria.  Especially at the end of urinating she has some pain and spasm.  She notices an odor to her urine.  She works as a city Midwife.  It has been many years since she has had a UTI.  She is prediabetic, not diabetic, and still has not yet started on the Metformin though she has the prescription.  She is going to be seeing a dietitian first.  Current allergies, medications, problem list, past/family and social histories reviewed.  Objective:  BP (!) 142/88   Pulse 86   Temp 98 F (36.7 C) (Temporal)   Resp 16   Ht 5\' 8"  (1.727 m)   Wt 220 lb (99.8 kg)   SpO2 99%   BMI 33.45 kg/m   No major acute distress.  No CVA tenderness.  Abdomen soft without mass or tenderness.  Assessment & Plan:   Assessment: 1. Dysuria       Plan: See instructions.  Orders Placed This Encounter  Procedures  . Urine Culture  . POCT urinalysis dipstick    No orders of the defined types were placed in this encounter.        Patient Instructions    Your urine is quite concentrated and does not have any definite infection.  I am checking a culture on it because of the few white blood cells that are present in it, but I do not think it is going to show an active infection.  In the meanwhile I want you to try and drink lots of fluids to keep yourself very well-hydrated.  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  When you are not working make sure you are drinking a lot more.  If you do not hear the results of your culture by 3 to 5 days from  now check with the staff and make sure that it was okay.  I will not be here.  Next month call and see if you can find out where Just FNP is going to be practicing.  You may have to find a new primary care doctor if she is not in a primary care practice.   If you have lab work done today you will be contacted with your lab results within the next 2 weeks.  If you have not heard from Adelina Mings then please contact us. The fastest way to get your results is to register for My Chart.   IF you received an x-ray today, you will receive an invoice from [facility]. Please contact [facility] at [phone] with questions or concerns regarding your invoice.   IF you received labwork today, you will receive an invoice from [place]. Please contact [facility] at [phone] with questions or concerns regarding your invoice.   Our billing staff will not be able to assist you with questions regarding bills from these companies.  You will be contacted with the lab results as soon as they are available. The fastest way to get  your results is to activate your My Chart account. Instructions are located on the last page of this paperwork. If you have not heard from [place] regarding the results in 2 weeks, please contact this office.        Return if symptoms worsen or fail to improve.   Janace Hoard, MD 10/14/2020

## 2020-10-14 NOTE — Patient Instructions (Addendum)
  Your urine is quite concentrated and does not have any definite infection.  I am checking a culture on it because of the few white blood cells that are present in it, but I do not think it is going to show an active infection.  In the meanwhile I want you to try and drink lots of fluids to keep yourself very well-hydrated.  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  When you are not working make sure you are drinking a lot more.  If you do not hear the results of your culture by 3 to 5 days from now check with the staff and make sure that it was okay.  I will not be here.  Next month call and see if you can find out where Adelina Mings Just FNP is going to be practicing.  You may have to find a new primary care doctor if she is not in a primary care practice.   If you have lab work done today you will be contacted with your lab results within the next 2 weeks.  If you have not heard from [place] then please contact us. The fastest way to get your results is to register for My Chart.   IF you received an x-ray today, you will receive an invoice from [facility]. Please contact [facility] at [phone] with questions or concerns regarding your invoice.   IF you received labwork today, you will receive an invoice from [place]. Please contact [facility] at [phone] with questions or concerns regarding your invoice.   Our billing staff will not be able to assist you with questions regarding bills from these companies.  You will be contacted with the lab results as soon as they are available. The fastest way to get your results is to activate your My Chart account. Instructions are located on the last page of this paperwork. If you have not heard from [place] regarding the results in 2 weeks, please contact this office.

## 2020-10-17 ENCOUNTER — Ambulatory Visit: Payer: Commercial Managed Care - PPO | Admitting: Family Medicine

## 2020-10-18 LAB — URINE CULTURE

## 2020-10-20 ENCOUNTER — Other Ambulatory Visit: Payer: Self-pay | Admitting: Family Medicine

## 2020-10-20 DIAGNOSIS — N3000 Acute cystitis without hematuria: Secondary | ICD-10-CM

## 2020-10-20 MED ORDER — NITROFURANTOIN MONOHYD MACRO 100 MG PO CAPS: 100.0000 mg | ORAL_CAPSULE | Freq: Two times a day (BID) | ORAL | 0 refills | Status: AC

## 2020-10-20 MED ORDER — PHENAZOPYRIDINE HCL 200 MG PO TABS: 200.0000 mg | ORAL_TABLET | Freq: Three times a day (TID) | ORAL | 0 refills | Status: DC | PRN

## 2020-10-22 ENCOUNTER — Other Ambulatory Visit: Payer: Self-pay | Admitting: Family Medicine

## 2020-10-22 DIAGNOSIS — N3000 Acute cystitis without hematuria: Secondary | ICD-10-CM

## 2020-10-22 MED ORDER — CEPHALEXIN 500 MG PO CAPS: 500.0000 mg | ORAL_CAPSULE | Freq: Three times a day (TID) | ORAL | 0 refills | Status: DC

## 2020-10-22 NOTE — Progress Notes (Signed)
Call the patient please:  Culture did grow bacteria.  RX for cephalexin 500 tid sent to pharmacy.  Janace Hoard MD

## 2020-11-14 ENCOUNTER — Ambulatory Visit: Payer: Commercial Managed Care - PPO | Admitting: Dietician

## 2020-11-15 ENCOUNTER — Encounter: Payer: Self-pay | Admitting: Family Medicine

## 2020-11-15 ENCOUNTER — Ambulatory Visit (INDEPENDENT_AMBULATORY_CARE_PROVIDER_SITE_OTHER): Payer: Commercial Managed Care - PPO | Admitting: Family Medicine

## 2020-11-15 ENCOUNTER — Other Ambulatory Visit: Payer: Self-pay | Admitting: Family Medicine

## 2020-11-15 ENCOUNTER — Other Ambulatory Visit: Payer: Self-pay

## 2020-11-15 VITALS — BP 142/98 | HR 108 | Temp 98.1°F | Ht 68.0 in | Wt 221.0 lb

## 2020-11-15 DIAGNOSIS — E269 Hyperaldosteronism, unspecified: Secondary | ICD-10-CM

## 2020-11-15 DIAGNOSIS — I152 Hypertension secondary to endocrine disorders: Secondary | ICD-10-CM | POA: Diagnosis not present

## 2020-11-15 DIAGNOSIS — Z0289 Encounter for other administrative examinations: Secondary | ICD-10-CM

## 2020-11-15 MED ORDER — LOSARTAN POTASSIUM 100 MG PO TABS: 100.0000 mg | ORAL_TABLET | Freq: Every day | ORAL | 3 refills | Status: DC

## 2020-11-15 MED ORDER — VALSARTAN 320 MG PO TABS: 320.0000 mg | ORAL_TABLET | Freq: Every day | ORAL | 3 refills | Status: DC

## 2020-11-15 NOTE — Telephone Encounter (Signed)
I told Krista [name] since I wont be here on Friday

## 2020-11-15 NOTE — Telephone Encounter (Signed)
Pharmacy sent note med is backordered. Routing to pharmacy.

## 2020-11-15 NOTE — Telephone Encounter (Signed)
  Notes to clinic:  Product Backordered/Unavailable   Requested Prescriptions  Pending Prescriptions Disp Refills   losartan (COZAAR) 100 MG tablet [Pharmacy Med Name: LOSARTAN POTASSIUM 100 MG TAB] 90 tablet 3    Sig: TAKE 1 TABLET BY MOUTH EVERY DAY      Cardiovascular:  Angiotensin Receptor Blockers Failed - 11/15/2020  9:30 AM      Failed - Last BP in normal range    BP Readings from Last 1 Encounters:  11/15/20 (!) 168/102          Passed - Cr in normal range and within 180 days    Creat  Date Value Ref Range Status  10/23/2012 0.59 0.50 - 1.10 mg/dL Final   Creatinine, Ser  Date Value Ref Range Status  08/29/2020 1.00 0.57 - 1.00 mg/dL Final   Creatinine, Urine  Date Value Ref Range Status  05/05/2013 36.72 mg/dL Final          Passed - K in normal range and within 180 days    Potassium  Date Value Ref Range Status  08/29/2020 4.1 3.5 - 5.2 mmol/L Final          Passed - Patient is not pregnant      Passed - Valid encounter within last 6 months    Recent Outpatient Visits           Today Encounter for completion of form with patient   [facility] [place] [facility], Krista Course, FNP   1 month ago Dysuria   [facility], Sandria Bales, MD   2 months ago Vaginal discharge   [facility], Krista Course, FNP   3 months ago Hypertension due to endocrine disorder   [facility] [place] [facility], Krista Course, FNP   6 months ago Hypertension due to endocrine disorder   [facility], Meda Coffee, MD

## 2020-11-15 NOTE — Telephone Encounter (Signed)
Patient has been notified of the change to valsartan.

## 2020-11-15 NOTE — Patient Instructions (Addendum)
  Hypertension, Adult Hypertension is another name for high blood pressure. High blood pressure forces your heart to work harder to pump blood. This can cause problems over time. There are two numbers in a blood pressure reading. There is a top number (systolic) over a bottom number (diastolic). It is best to have a blood pressure that is below 120/80. Healthy choices can help lower your blood pressure, or you may need medicine to help lower it. What are the causes? The cause of this condition is not known. Some conditions may be related to high blood pressure. What increases the risk?  Smoking.  Having type 2 diabetes mellitus, high cholesterol, or both.  Not getting enough exercise or physical activity.  Being overweight.  Having too much fat, sugar, calories, or salt (sodium) in your diet.  Drinking too much alcohol.  Having long-term (chronic) kidney disease.  Having a family history of high blood pressure.  Age. Risk increases with age.  Race. You may be at higher risk if you are African American.  Gender. Men are at higher risk than women before age 45. After age 65, women are at higher risk than men.  Having obstructive sleep apnea.  Stress. What are the signs or symptoms?  High blood pressure may not cause symptoms. Very high blood pressure (hypertensive crisis) may cause: ? Headache. ? Feelings of worry or nervousness (anxiety). ? Shortness of breath. ? Nosebleed. ? A feeling of being sick to your stomach (nausea). ? Throwing up (vomiting). ? Changes in how you see. ? Very bad chest pain. ? Seizures. How is this treated?  This condition is treated by making healthy lifestyle changes, such as: ? Eating healthy foods. ? Exercising more. ? Drinking less alcohol.  Your health care provider may prescribe medicine if lifestyle changes are not enough to get your blood pressure under control, and if: ? Your top number is above 130. ? Your bottom number is  above 80.  Your personal target blood pressure may vary. Follow these instructions at home: Eating and drinking  If told, follow the DASH eating plan. To follow this plan: ? Fill one half of your plate at each meal with fruits and vegetables. ? Fill one fourth of your plate at each meal with whole grains. Whole grains include whole-wheat pasta, brown rice, and whole-grain bread. ? Eat or drink low-fat dairy products, such as skim milk or low-fat yogurt. ? Fill one fourth of your plate at each meal with low-fat (lean) proteins. Low-fat proteins include fish, chicken without skin, eggs, beans, and tofu. ? Avoid fatty meat, cured and processed meat, or chicken with skin. ? Avoid pre-made or processed food.  Eat less than 1,500 mg of salt each day.  Do not drink alcohol if: ? Your doctor tells you not to drink. ? You are pregnant, may be pregnant, or are planning to become pregnant.  If you drink alcohol: ? Limit how much you use to:  0-1 drink a day for women.  0-2 drinks a day for men. ? Be aware of how much alcohol is in your drink. In the [place] one drink equals one 12 oz bottle of beer (355 mL), one 5 oz glass of wine (148 mL), or one 1 oz glass of hard liquor (44 mL).   Lifestyle  Work with your doctor to stay at a healthy weight or to lose weight. Ask your doctor what the best weight is for you.  Get at least 30 minutes of   exercise most days of the week. This may include walking, swimming, or biking.  Get at least 30 minutes of exercise that strengthens your muscles (resistance exercise) at least 3 days a week. This may include lifting weights or doing Pilates.  Do not use any products that contain nicotine or tobacco, such as cigarettes, e-cigarettes, and chewing tobacco. If you need help quitting, ask your doctor.  Check your blood pressure at home as told by your doctor.  Keep all follow-up visits as told by your doctor. This is important.   Medicines  Take  over-the-counter and prescription medicines only as told by your doctor. Follow directions carefully.  Do not skip doses of blood pressure medicine. The medicine does not work as well if you skip doses. Skipping doses also puts you at risk for problems.  Ask your doctor about side effects or reactions to medicines that you should watch for. Contact a doctor if you:  Think you are having a reaction to the medicine you are taking.  Have headaches that keep coming back (recurring).  Feel dizzy.  Have swelling in your ankles.  Have trouble with your vision. Get help right away if you:  Get a very bad headache.  Start to feel mixed up (confused).  Feel weak or numb.  Feel faint.  Have very bad pain in your: ? Chest. ? Belly (abdomen).  Throw up more than once.  Have trouble breathing. Summary  Hypertension is another name for high blood pressure.  High blood pressure forces your heart to work harder to pump blood.  For most people, a normal blood pressure is less than 120/80.  Making healthy choices can help lower blood pressure. If your blood pressure does not get lower with healthy choices, you may need to take medicine. This information is not intended to replace advice given to you by your health care provider. Make sure you discuss any questions you have with your health care provider. Document Revised: 04/30/2018 Document Reviewed: 04/30/2018 Elsevier Patient Education  2021 Elsevier Inc.   If you have lab work done today you will be contacted with your lab results within the next 2 weeks.  If you have not heard from us then please contact us. The fastest way to get your results is to register for My Chart.   IF you received an x-ray today, you will receive an invoice from [facility]. Please contact [facility] at [phone] with questions or concerns regarding your invoice.   IF you received labwork today, you will receive an invoice from  [facility]. Please contact [facility] at [phone] with questions or concerns regarding your invoice.   Our billing staff will not be able to assist you with questions regarding bills from these companies.  You will be contacted with the lab results as soon as they are available. The fastest way to get your results is to activate your My Chart account. Instructions are located on the last page of this paperwork. If you have not heard from us regarding the results in 2 weeks, please contact this office.      

## 2020-11-15 NOTE — Telephone Encounter (Signed)
I switched her to valsartan

## 2020-11-15 NOTE — Progress Notes (Signed)
3/15/20229:30 AM  Krista [name] [birth year], 40 y.o., female [id number]  Chief Complaint  Patient presents with  . Hypertension    Needs DOT note for BP clearance    HPI:   Patient is a 40 y.o. female with past medical history significant for hyperaldosteronism who presents today for HTN follow up.  Has had one visit with nutritionist,  Will visit again in April Doesn't want to recheck labs yet Due to BP > 140/90 needs DOT form signed  HTN Losartan 50mg  daily spirolactone 100mg  daily Sees endocrinology for hyperaldosteronism She still does not have a BP cuff at home  BP Readings from Last 3 Encounters:  11/15/20 (!) 168/102  10/14/20 (!) 142/88  08/29/20 (!) 150/109   Diabetes Metformin 500mg  daily: not taking Lab Results  Component Value Date   HGBA1C 6.4 (H) 08/29/2020   HLD Crestor 10mg  daily: not taking Lab Results  Component Value Date   CHOL 252 (H) 08/29/2020   HDL 46 08/29/2020   LDLCALC 175 (H) 08/29/2020   TRIG 167 (H) 08/29/2020   CHOLHDL 5.5 (H) 08/29/2020    Vitamin D deficiency Last vitamin D Lab Results  Component Value Date   VD25OH 13.9 (L) 02/12/2016     Depression screen PHQ 2/9 11/15/2020 10/11/2020 08/29/2020  Decreased Interest 0 0 0  Down, Depressed, Hopeless 0 0 0  PHQ - 2 Score 0 0 0    Fall Risk  11/15/2020 10/11/2020 08/29/2020 04/21/2020 09/24/2019  Falls in the past year? 0 0 0 0 0  Number falls in past yr: 0 - 0 0 0  Injury with Fall? 0 - 0 0 0  Risk for fall due to : - - - No Fall Risks -  Follow up Falls evaluation completed - Falls evaluation completed Falls evaluation completed -     Allergies  Allergen Reactions  . Fluconazole     Lips swelling  . Sulfa Antibiotics Other (See Comments)    Eyes turn blood shot red    Prior to Admission medications   Medication Sig Start Date End Date Taking? Authorizing Provider  losartan (COZAAR) 25 MG tablet Take 1 tablet (25 mg total) by mouth daily. 08/04/20  Yes Lydon Vansickle,  08/31/2020, FNP  spironolactone (ALDACTONE) 100 MG tablet Take 2 tablets (200 mg total) by mouth 2 (two) times daily. 04/19/20  Yes 09/26/2019, 14/2/21, MD  metroNIDAZOLE (FLAGYL) 500 MG tablet Take 1 tablet (500 mg total) by mouth 2 (two) times daily. Patient not taking: Reported on 08/29/2020 08/04/20   Kaisei Gilbo, Lezlie Lye, FNP    Past Medical History:  Diagnosis Date  . Abnormal Pap smear 2010   Colpo & LEEP; LAST PAP 09/2011  . Adrenal gland disorder (HCC) 2002  . Aldosteronism (HCC) 2012  . BV (bacterial vaginosis) 2008  . CIN I (cervical intraepithelial neoplasia I) 2010  . Dermatitis 2007  . H/O bacterial infection   . H/O candidiasis   . H/O thalassemia   . H/O varicella   . Hx: UTI (urinary tract infection) 2010  . Hyperaldosteronism (HCC)   . Hypertension   . Hypokalemia 2007   IP Sept 2012  . Infection    UTI X 1  . Migraine   . Migraine headache    OTC  . Monilial vaginitis 2008  . Postpartum hypertension 07/06/10  . Pregnancy induced hypertension 2011  . Primary aldosteronism (HCC)   . Scoliosis   . Sickle cell trait (HCC)   . Thalassemia  minor    CHRONIC  . Yeast infection   . Yeast vaginitis 2010    Past Surgical History:  Procedure Laterality Date  . BREAST REDUCTION SURGERY  Jan 2006  . CESAREAN SECTION  2005 2011 2014  . CESAREAN SECTION WITH BILATERAL TUBAL LIGATION Bilateral 05/05/2013   Procedure: REPEAT CESAREAN SECTION WITH BILATERAL TUBAL LIGATION,;  Surgeon: Hal Morales, MD;  Location: [facility];  Service: Obstetrics;  Laterality: Bilateral;  . DILATION AND CURETTAGE OF UTERUS  2003  . DILITATION & CURRETTAGE/HYSTROSCOPY WITH NOVASURE ABLATION N/A 03/22/2017   Procedure: DILATATION & CURETTAGE/HYSTEROSCOPY WITH NOVASURE ABLATION;  Surgeon: Olivia Mackie, MD;  Location: [facility];  Service: Gynecology;  Laterality: N/A;  . TUBAL LIGATION    . WISDOM TOOTH EXTRACTION      Social History   Tobacco Use  . Smoking status: Never Smoker  . Smokeless  tobacco: Never Used  Substance Use Topics  . Alcohol use: Yes    Comment: OCC    Family History  Problem Relation Age of Onset  . Thalassemia Mother   . Arthritis Mother   . Early death Mother 40  . Other Mother        GUILLON-BERRE SX; BLOOD CLOT  . Hypertension Father   . Heart attack Father   . Heart disease Father        MI  . Early death Father   . Kidney disease Father   . Breast cancer Maternal Aunt   . Breast cancer Maternal Grandmother   . Cancer Maternal Grandfather        Bone    Review of Systems  Constitutional: Negative for chills, fever and malaise/fatigue.  Eyes: Negative for blurred vision and double vision.  Respiratory: Negative for cough, shortness of breath and wheezing.   Cardiovascular: Negative for chest pain, palpitations and leg swelling.  Gastrointestinal: Negative for abdominal pain, nausea and vomiting.  Genitourinary: Negative for dysuria, flank pain, frequency, hematuria and urgency.  Musculoskeletal: Negative for back pain and joint pain.  Skin: Negative for rash.  Neurological: Negative for dizziness, weakness and headaches.     OBJECTIVE:  Today's Vitals   11/15/20 0906  BP: (!) 168/102  Pulse: (!) 108  Temp: 98.1 F (36.7 C)  SpO2: 99%  Weight: 221 lb (100.2 kg)  Height: 5\' 8"  (1.727 m)   Body mass index is 33.6 kg/m.   Physical Exam Constitutional:      General: She is not in acute distress.    Appearance: Normal appearance. She is not ill-appearing.  HENT:     Head: Normocephalic.  Cardiovascular:     Rate and Rhythm: Normal rate and regular rhythm.     Pulses: Normal pulses.     Heart sounds: Normal heart sounds. No murmur heard. No friction rub. No gallop.   Pulmonary:     Effort: Pulmonary effort is normal. No respiratory distress.     Breath sounds: Normal breath sounds. No stridor. No wheezing, rhonchi or rales.  Abdominal:     General: Bowel sounds are normal.     Palpations: Abdomen is soft.      Tenderness: There is no abdominal tenderness.  Musculoskeletal:     Right lower leg: No edema.     Left lower leg: No edema.  Skin:    General: Skin is warm and dry.  Neurological:     Mental Status: She is alert and oriented to person, place, and time.  Psychiatric:  Mood and Affect: Mood normal.        Behavior: Behavior normal.     No results found for this or any previous visit (from the past 24 hour(s)).  No results found.   ASSESSMENT and PLAN  Problem List Items Addressed This Visit      Cardiovascular and Mediastinum   HTN (hypertension) (Chronic)   Relevant Medications   losartan (COZAAR) 100 MG tablet    Other Visit Diagnoses    Encounter for completion of form with patient    -  Primary         Losartan increased from 50 to 100 Encouraged to get home BP cuff to monitor for goal< 130/80 Return in about 1 week (around 11/22/2020) for Nurse visit BP recheck.   Macario Carls Rogina Schiano, FNP-BC Primary [facility] at [facility] [address] [place], [place] [address] Ph.  [phone] Fax [phone]

## 2020-11-22 ENCOUNTER — Other Ambulatory Visit: Payer: Self-pay

## 2020-11-22 ENCOUNTER — Ambulatory Visit (INDEPENDENT_AMBULATORY_CARE_PROVIDER_SITE_OTHER): Payer: Commercial Managed Care - PPO | Admitting: Family Medicine

## 2020-11-22 DIAGNOSIS — I10 Essential (primary) hypertension: Secondary | ICD-10-CM

## 2020-11-22 NOTE — Patient Instructions (Signed)
° ° ° °  If you have lab work done today you will be contacted with your lab results within the next 2 weeks.  If you have not heard from us then please contact us. The fastest way to get your results is to register for My Chart. ° ° °IF you received an x-ray today, you will receive an invoice from [facility]. Please contact [facility] at [phone] with questions or concerns regarding your invoice.  ° °IF you received labwork today, you will receive an invoice from [facility]. Please contact [facility] at [phone] with questions or concerns regarding your invoice.  ° °Our billing staff will not be able to assist you with questions regarding bills from these companies. ° °You will be contacted with the lab results as soon as they are available. The fastest way to get your results is to activate your My Chart account. Instructions are located on the last page of this paperwork. If you have not heard from us regarding the results in 2 weeks, please contact this office. °  ° ° ° °

## 2020-11-22 NOTE — Progress Notes (Signed)
Pt here for BP check and DOT form pt BP within range

## 2020-12-07 ENCOUNTER — Ambulatory Visit: Payer: Commercial Managed Care - PPO | Admitting: Dietician

## 2020-12-15 NOTE — Telephone Encounter (Signed)
Opened In error

## 2020-12-28 ENCOUNTER — Encounter: Payer: Self-pay | Admitting: Emergency Medicine

## 2020-12-28 ENCOUNTER — Ambulatory Visit
Admission: EM | Admit: 2020-12-28 | Discharge: 2020-12-28 | Disposition: A | Discharge status: Home / Self Care | Payer: Commercial Managed Care - PPO | Attending: Family Medicine | Admitting: Family Medicine

## 2020-12-28 ENCOUNTER — Other Ambulatory Visit: Payer: Self-pay

## 2020-12-28 DIAGNOSIS — B9689 Other specified bacterial agents as the cause of diseases classified elsewhere: Secondary | ICD-10-CM | POA: Insufficient documentation

## 2020-12-28 DIAGNOSIS — N760 Acute vaginitis: Secondary | ICD-10-CM | POA: Diagnosis not present

## 2020-12-28 MED ORDER — METRONIDAZOLE 500 MG PO TABS: 500.0000 mg | ORAL_TABLET | Freq: Two times a day (BID) | ORAL | 0 refills | Status: DC

## 2020-12-28 NOTE — ED Triage Notes (Signed)
Pt sts vaginal discharge with odor that is typically BV per pt; denies other sx

## 2020-12-28 NOTE — ED Provider Notes (Signed)
[facility]    CSN: [id number] Arrival date & time: 12/28/20  1859      History   Chief Complaint Chief Complaint  Patient presents with  . Vaginal Discharge    HPI Krista [name] is a 40 y.o. female.   Patient presenting today with 1 day history of vaginal odor, suprapubic bloating.  Denies abnormal discharge, abdominal pain, flank pain, dysuria, hematuria, rashes or lesions.  States she gets BV several times a year and it starts exactly this way every time.  Denies any exposures to STDs or concern for pregnancy, status post ablation.  Not trying anything over-the-counter at this time for symptoms.     Past Medical History:  Diagnosis Date  . Abnormal Pap smear 2010   Colpo & LEEP; LAST PAP 09/2011  . Adrenal gland disorder (HCC) 2002  . Aldosteronism (HCC) 2012  . BV (bacterial vaginosis) 2008  . CIN I (cervical intraepithelial neoplasia I) 2010  . Dermatitis 2007  . H/O bacterial infection   . H/O candidiasis   . H/O thalassemia   . H/O varicella   . Hx: UTI (urinary tract infection) 2010  . Hyperaldosteronism (HCC)   . Hypertension   . Hypokalemia 2007   IP Sept 2012  . Infection    UTI X 1  . Migraine   . Migraine headache    OTC  . Monilial vaginitis 2008  . Postpartum hypertension 07/06/10  . Pregnancy induced hypertension 2011  . Primary aldosteronism (HCC)   . Scoliosis   . Sickle cell trait (HCC)   . Thalassemia minor    CHRONIC  . Yeast infection   . Yeast vaginitis 2010    Patient Active Problem List   Diagnosis Date Noted  . Low magnesium levels 02/12/2016  . Low calcium levels 02/12/2016  . Hypertensive urgency 02/12/2016  . Low blood potassium 02/12/2016  . 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 SIRS (systemic inflammatory response syndrome) (HCC) 08/09/2013  . Fever 08/09/2013  . Anemia 05/08/2013  . Encounter for sterilization 05/08/2013  . Infection due to acinetobacter baumannii  05/08/2013  . PIH (pregnancy induced hypertension), antepartum 05/05/2013  . Nausea and vomiting in pregnancy 10/24/2012  . H/O cesarean section x 2 09/19/2012  . SAB (spontaneous abortion) x 3, TAB x 2 09/19/2012  . H/O LEEP (loop electrosurgical excision procedure) of cervix complicating pregnancy 09/19/2012  . H/O bilateral breast reduction surgery 09/19/2012  . Sickle cell trait (HCC) 09/19/2012  . Thalassemia minor 09/19/2012  . Leukocytosis 12/04/2011  . Hyperaldosteronism (HCC) 12/03/2011  . Hypokalemia 12/03/2011  . HTN (hypertension) 12/03/2011    Past Surgical History:  Procedure Laterality Date  . BREAST REDUCTION SURGERY  Jan 2006  . CESAREAN SECTION  2005 2011 2014  . CESAREAN SECTION WITH BILATERAL TUBAL LIGATION Bilateral 05/05/2013   Procedure: REPEAT CESAREAN SECTION WITH BILATERAL TUBAL LIGATION,;  Surgeon: Hal Morales, MD;  Location: [facility];  Service: Obstetrics;  Laterality: Bilateral;  . DILATION AND CURETTAGE OF UTERUS  2003  . DILITATION & CURRETTAGE/HYSTROSCOPY WITH NOVASURE ABLATION N/A 03/22/2017   Procedure: DILATATION & CURETTAGE/HYSTEROSCOPY WITH NOVASURE ABLATION;  Surgeon: Olivia Mackie, MD;  Location: [facility];  Service: Gynecology;  Laterality: N/A;  . TUBAL LIGATION    . WISDOM TOOTH EXTRACTION      OB History    Gravida  7   Para  3   Term  3   Preterm  0   AB  4  Living  4     SAB  3   IAB  1   Ectopic  0   Multiple  1   Live Births  4            Home Medications    Prior to Admission medications   Medication Sig Start Date End Date Taking? Authorizing Provider  metroNIDAZOLE (FLAGYL) 500 MG tablet Take 1 tablet (500 mg total) by mouth 2 (two) times daily. 12/28/20  Yes Particia Nearing, PA-C  metFORMIN (GLUCOPHAGE) 500 MG tablet Take 1 tablet (500 mg total) by mouth daily with breakfast. Patient not taking: No sig reported 08/30/20   Just, Azalee Course, FNP  rosuvastatin (CRESTOR) 10 MG tablet Take 1 tablet (10  mg total) by mouth daily. Patient not taking: No sig reported 08/30/20   Just, Azalee Course, FNP  spironolactone (ALDACTONE) 100 MG tablet Take 2 tablets (200 mg total) by mouth 2 (two) times daily. 04/19/20   Lezlie Lye, Meda Coffee, MD  valsartan (DIOVAN) 320 MG tablet Take 1 tablet (320 mg total) by mouth daily. 11/15/20   Just, Azalee Course, FNP    Family History Family History  Problem Relation Age of Onset  . Thalassemia Mother   . Arthritis Mother   . Early death Mother 19  . Other Mother        GUILLON-BERRE SX; BLOOD CLOT  . Hypertension Father   . Heart attack Father   . Heart disease Father        MI  . Early death Father   . Kidney disease Father   . Breast cancer Maternal Aunt   . Breast cancer Maternal Grandmother   . Cancer Maternal Grandfather        Bone    Social History Social History   Tobacco Use  . Smoking status: Never Smoker  . Smokeless tobacco: Never Used  Vaping Use  . Vaping Use: Never used  Substance Use Topics  . Alcohol use: Yes    Comment: OCC  . Drug use: No     Allergies   Fluconazole and Sulfa antibiotics   Review of Systems Review of Systems Per HPI  Physical Exam Triage Vital Signs ED Triage Vitals [12/28/20 2000]  Enc Vitals Group     BP (!) 153/102     Pulse Rate 83     Resp 18     Temp 98.4 F (36.9 C)     Temp Source Oral     SpO2 98 %     Weight      Height      Head Circumference      Peak Flow      Pain Score 0     Pain Loc      Pain Edu?      Excl. in GC?    No data found.  Updated Vital Signs BP (!) 153/102 (BP Location: Left Arm)   Pulse 83   Temp 98.4 F (36.9 C) (Oral)   Resp 18   SpO2 98%   Visual Acuity Right Eye Distance:   Left Eye Distance:   Bilateral Distance:    Right Eye Near:   Left Eye Near:    Bilateral Near:     Physical Exam Vitals and nursing note reviewed.  Constitutional:      Appearance: Normal appearance. She is not ill-appearing.  HENT:     Head: Atraumatic.      Mouth/Throat:     Mouth: Mucous membranes  are moist.     Pharynx: Oropharynx is clear.  Eyes:     Extraocular Movements: Extraocular movements intact.     Conjunctiva/sclera: Conjunctivae normal.  Cardiovascular:     Rate and Rhythm: Normal rate and regular rhythm.     Heart sounds: Normal heart sounds.  Pulmonary:     Effort: Pulmonary effort is normal.     Breath sounds: Normal breath sounds.  Abdominal:     General: Bowel sounds are normal. There is no distension.     Palpations: Abdomen is soft.     Tenderness: There is no abdominal tenderness. There is no right CVA tenderness, left CVA tenderness or guarding.  Genitourinary:    Comments: GU exam deferred, self swab performed Musculoskeletal:        General: Normal range of motion.     Cervical back: Normal range of motion and neck supple.  Skin:    General: Skin is warm and dry.  Neurological:     Mental Status: She is alert and oriented to person, place, and time.  Psychiatric:        Mood and Affect: Mood normal.        Thought Content: Thought content normal.        Judgment: Judgment normal.      UC Treatments / Results  Labs (all labs ordered are listed, but only abnormal results are displayed) Labs Reviewed  CERVICOVAGINAL ANCILLARY ONLY    EKG   Radiology No results found.  Procedures Procedures (including critical care time)  Medications Ordered in UC Medications - No data to display  Initial Impression / Assessment and Plan / UC Course  I have reviewed the triage vital signs and the nursing notes.  Pertinent labs & imaging results that were available during my care of the patient were reviewed by me and considered in my medical decision making (see chart for details).     Vaginal swab pending, given her history of recurrent BV and consistent symptoms we will go ahead and start metronidazole course while awaiting results.  Discussed boric acid, probiotics, good vaginal hygiene.  Adjust treatment  if needed based on swab results.  Final Clinical Impressions(s) / UC Diagnoses   Final diagnoses:  Acute vaginitis  BV (bacterial vaginosis)   Discharge Instructions   None    ED Prescriptions    Medication Sig Dispense Auth. Provider   metroNIDAZOLE (FLAGYL) 500 MG tablet Take 1 tablet (500 mg total) by mouth 2 (two) times daily. 14 tablet Particia Nearing, [place]     PDMP not reviewed this encounter.   Particia Nearing, [place] 12/28/20 2023

## 2020-12-29 ENCOUNTER — Ambulatory Visit: Payer: Self-pay

## 2020-12-30 LAB — CERVICOVAGINAL ANCILLARY ONLY
Bacterial Vaginitis (gardnerella): NEGATIVE
Candida Glabrata: NEGATIVE
Candida Vaginitis: NEGATIVE
Chlamydia: NEGATIVE
Comment: NEGATIVE
Comment: NEGATIVE
Comment: NEGATIVE
Comment: NEGATIVE
Comment: NEGATIVE
Comment: NORMAL
Neisseria Gonorrhea: NEGATIVE
Trichomonas: NEGATIVE

## 2021-01-12 ENCOUNTER — Ambulatory Visit: Payer: Commercial Managed Care - PPO | Admitting: Nurse Practitioner

## 2021-02-20 ENCOUNTER — Encounter: Payer: Self-pay | Admitting: Nurse Practitioner

## 2021-02-20 ENCOUNTER — Ambulatory Visit (INDEPENDENT_AMBULATORY_CARE_PROVIDER_SITE_OTHER): Payer: Commercial Managed Care - PPO | Admitting: Nurse Practitioner

## 2021-02-20 ENCOUNTER — Other Ambulatory Visit: Payer: Self-pay

## 2021-02-20 VITALS — BP 148/98 | HR 79 | Temp 98.6°F | Ht 68.0 in | Wt 229.4 lb

## 2021-02-20 DIAGNOSIS — R739 Hyperglycemia, unspecified: Secondary | ICD-10-CM

## 2021-02-20 DIAGNOSIS — E269 Hyperaldosteronism, unspecified: Secondary | ICD-10-CM

## 2021-02-20 DIAGNOSIS — I152 Hypertension secondary to endocrine disorders: Secondary | ICD-10-CM | POA: Diagnosis not present

## 2021-02-20 LAB — BASIC METABOLIC PANEL
BUN: 14 mg/dL (ref 6–23)
CO2: 27 mEq/L (ref 19–32)
Calcium: 9.6 mg/dL (ref 8.4–10.5)
Chloride: 102 mEq/L (ref 96–112)
Creatinine, Ser: 0.89 mg/dL (ref 0.40–1.20)
GFR: 81.42 mL/min (ref >60.00)
Glucose, Bld: 79 mg/dL (ref 70–99)
Potassium: 3.6 mEq/L (ref 3.5–5.1)
Sodium: 138 mEq/L (ref 135–145)

## 2021-02-20 LAB — CBC
HCT: 38.9 % (ref 36.0–46.0)
Hemoglobin: 12.3 g/dL (ref 12.0–15.0)
MCHC: 31.7 g/dL (ref 30.0–36.0)
MCV: 68.8 fl — ABNORMAL LOW (ref 78.0–100.0)
Platelets: 355 10*3/uL (ref 150.0–400.0)
RBC: 5.65 Mil/uL — ABNORMAL HIGH (ref 3.87–5.11)
RDW: 15.5 % (ref 11.5–15.5)
WBC: 10 10*3/uL (ref 4.0–10.5)

## 2021-02-20 LAB — URINALYSIS
Bilirubin Urine: NEGATIVE
Hgb urine dipstick: NEGATIVE
Ketones, ur: NEGATIVE
Leukocytes,Ua: NEGATIVE
Nitrite: NEGATIVE
Specific Gravity, Urine: 1.01 (ref 1.000–1.030)
Total Protein, Urine: NEGATIVE
Urine Glucose: NEGATIVE
Urobilinogen, UA: 0.2 (ref 0.0–1.0)
pH: 7 (ref 5.0–8.0)

## 2021-02-20 LAB — HEMOGLOBIN A1C: Hgb A1c MFr Bld: 6.7 % — ABNORMAL HIGH (ref 4.6–6.5)

## 2021-02-20 LAB — TSH: TSH: 3.36 u[IU]/mL (ref 0.35–4.50)

## 2021-02-20 MED ORDER — VALSARTAN 320 MG PO TABS: 320.0000 mg | ORAL_TABLET | Freq: Every day | ORAL | 3 refills | Status: AC

## 2021-02-20 MED ORDER — CARVEDILOL 3.125 MG PO TABS: 3.1250 mg | ORAL_TABLET | Freq: Two times a day (BID) | ORAL | 5 refills | Status: AC

## 2021-02-20 NOTE — Progress Notes (Signed)
Subjective:  Patient ID: Krista [name], female    DOB: 1981-05-06  Age: 40 y.o. MRN: [id number]  CC: Establish Care (Hypertension (pt not fasting))  HPI  Hyperaldosteronism (HCC) Managed by Dr. Talmage Nap with [facility]  HTN (hypertension) Uncontrolled, asymtomatic BP Readings from Last 3 Encounters:  02/20/21 (!) 148/98  12/28/20 (!) 153/102  11/15/20 (!) 142/98  Spironolactone 100mg  BID prescribed by Dr. Valsartan 320mg  daily prescribed by previous pcp. Admits Krista [name] is not compliant with DASH diet nor daily exercise. S/p tubal ligation   Advised about importance of DASH diet and medication compliance Schedule f/up with Dr. Talmage Nap Repeat BMP, TSH and CBC Add coreg 3.125mg  BID F/up in 56months Maintain valsartan dose BP Readings from Last 3 Encounters:  02/20/21 (!) 148/98  12/28/20 (!) 153/102  11/15/20 (!) 142/98     Reviewed past Medical, Social and Family history today.  Outpatient Medications Prior to Visit  Medication Sig Dispense Refill   potassium chloride (KLOR-CON) 20 MEQ packet 1 packet with food     spironolactone (ALDACTONE) 100 MG tablet Take 100 mg by mouth in the morning and at bedtime.     valsartan (DIOVAN) 320 MG tablet Take 1 tablet (320 mg total) by mouth daily. 90 tablet 3   metFORMIN (GLUCOPHAGE) 500 MG tablet Take 1 tablet (500 mg total) by mouth daily with breakfast. (Patient not taking: No sig reported) 90 tablet 3   metroNIDAZOLE (FLAGYL) 500 MG tablet Take 1 tablet (500 mg total) by mouth 2 (two) times daily. 14 tablet 0   rosuvastatin (CRESTOR) 10 MG tablet Take 1 tablet (10 mg total) by mouth daily. 90 tablet 3   spironolactone (ALDACTONE) 100 MG tablet Take 2 tablets (200 mg total) by mouth 2 (two) times daily. 60 tablet 0   No facility-administered medications prior to visit.    ROS See HPI  Objective:  BP (!) 148/98 (Patient Position: Supine)   Pulse 79   Temp 98.6 F (37 C)   Ht 5\' 8"  (1.727 m)   Wt 229 lb  6.4 oz (104.1 kg)   SpO2 99%   BMI 34.88 kg/m   Physical Exam Vitals reviewed.  Constitutional:      Appearance: Krista [name] is obese.  Cardiovascular:     Rate and Rhythm: Normal rate and regular rhythm.     Pulses: Normal pulses.     Heart sounds: Normal heart sounds.  Musculoskeletal:     Right lower leg: No edema.     Left lower leg: No edema.  Neurological:     Mental Status: Krista [name] is alert and oriented to person, place, and time.   Assessment & Plan:  This visit occurred during the SARS-CoV-2 public health emergency.  Safety protocols were in place, including screening questions prior to the visit, additional usage of staff PPE, and extensive cleaning of exam room while observing appropriate contact time as indicated for disinfecting solutions.   [name] was seen today for establish care.  Diagnoses and all orders for this visit:  Hypertension due to endocrine disorder -     Basic metabolic panel -     TSH -     CBC -     Urinalysis -     carvedilol (COREG) 3.125 MG tablet; Take 1 tablet (3.125 mg total) by mouth 2 (two) times daily with a meal. -     valsartan (DIOVAN) 320 MG tablet; Take 1 tablet (320 mg total) by mouth daily.  Hyperaldosteronism (HCC)  Hyperglycemia -  Hemoglobin A1c   Problem List Items Addressed This Visit       Cardiovascular and Mediastinum   HTN (hypertension) - Primary (Chronic)    Uncontrolled, asymtomatic BP Readings from Last 3 Encounters:  02/20/21 (!) 148/98  12/28/20 (!) 153/102  11/15/20 (!) 142/98  Spironolactone 100mg  BID prescribed by Dr. Valsartan 320mg  daily prescribed by previous pcp. Admits Krista [name] is not compliant with DASH diet nor daily exercise. S/p tubal ligation   Advised about importance of DASH diet and medication compliance Schedule f/up with Dr. Talmage Nap Repeat BMP, TSH and CBC Add coreg 3.125mg  BID F/up in 13months Maintain valsartan dose        Relevant Medications   spironolactone (ALDACTONE) 100 MG  tablet   carvedilol (COREG) 3.125 MG tablet   valsartan (DIOVAN) 320 MG tablet   Other Relevant Orders   Basic metabolic panel   TSH   CBC   Urinalysis     Endocrine   Hyperaldosteronism (HCC) (Chronic)    Managed by Dr. Talmage Nap with [facility]       Other Visit Diagnoses     Hyperglycemia       Relevant Orders   Hemoglobin A1c       Follow-up: Return in about 2 months (around 04/22/2021) for HTN.  Talmage Nap, NP

## 2021-02-20 NOTE — Assessment & Plan Note (Signed)
Managed by Dr. Talmage Nap with [facility]

## 2021-02-20 NOTE — Patient Instructions (Addendum)
Go to lab for blood draw and urine collection Maintain low sodium diet. This is important for HTN control. Schedule appt with nutritionist. Valsartan dose should be 320mg  once a day. Start coreg 3.125mg  BID F/up in 55months  DASH Eating Plan DASH stands for Dietary Approaches to Stop Hypertension. The DASH eating plan is a healthy eating plan that has been shown to: Reduce high blood pressure (hypertension). Reduce your risk for type 2 diabetes, heart disease, and stroke. Help with weight loss. What are tips for following this plan? Reading food labels Check food labels for the amount of salt (sodium) per serving. Choose foods with less than 5 percent of the Daily Value of sodium. Generally, foods with less than 300 milligrams (mg) of sodium per serving fit into this eating plan. To find whole grains, look for the word "whole" as the first word in the ingredient list. Shopping Buy products labeled as "low-sodium" or "no salt added." Buy fresh foods. Avoid canned foods and pre-made or frozen meals. Cooking Avoid adding salt when cooking. Use salt-free seasonings or herbs instead of table salt or sea salt. Check with your health care provider or pharmacist before using salt substitutes. Do not fry foods. Cook foods using healthy methods such as baking, boiling, grilling, roasting, and broiling instead. Cook with heart-healthy oils, such as olive, canola, avocado, soybean, or sunflower oil. Meal planning  Eat a balanced diet that includes: 4 or more servings of fruits and 4 or more servings of vegetables each day. Try to fill one-half of your plate with fruits and vegetables. 6-8 servings of whole grains each day. Less than 6 oz (170 g) of lean meat, poultry, or fish each day. A 3-oz (85-g) serving of meat is about the same size as a deck of cards. One egg equals 1 oz (28 g). 2-3 servings of low-fat dairy each day. One serving is 1 cup (237 mL). 1 serving of nuts, seeds, or beans 5 times  each week. 2-3 servings of heart-healthy fats. Healthy fats called omega-3 fatty acids are found in foods such as walnuts, flaxseeds, fortified milks, and eggs. These fats are also found in cold-water fish, such as sardines, salmon, and mackerel. Limit how much you eat of: Canned or prepackaged foods. Food that is high in trans fat, such as some fried foods. Food that is high in saturated fat, such as fatty meat. Desserts and other sweets, sugary drinks, and other foods with added sugar. Full-fat dairy products. Do not salt foods before eating. Do not eat more than 4 egg yolks a week. Try to eat at least 2 vegetarian meals a week. Eat more home-cooked food and less restaurant, buffet, and fast food.  Lifestyle When eating at a restaurant, ask that your food be prepared with less salt or no salt, if possible. If you drink alcohol: Limit how much you use to: 0-1 drink a day for women who are not pregnant. 0-2 drinks a day for men. Be aware of how much alcohol is in your drink. In the [place] one drink equals one 12 oz bottle of beer (355 mL), one 5 oz glass of wine (148 mL), or one 1 oz glass of hard liquor (44 mL). General information Avoid eating more than 2,300 mg of salt a day. If you have hypertension, you may need to reduce your sodium intake to 1,500 mg a day. Work with your health care provider to maintain a healthy body weight or to lose weight. Ask what an ideal weight  is for you. Get at least 30 minutes of exercise that causes your heart to beat faster (aerobic exercise) most days of the week. Activities may include walking, swimming, or biking. Work with your health care provider or dietitian to adjust your eating plan to your individual calorie needs. What foods should I eat? Fruits All fresh, dried, or frozen fruit. Canned fruit in natural juice (without addedsugar). Vegetables Fresh or frozen vegetables (raw, steamed, roasted, or grilled). Low-sodium or reduced-sodium tomato  and vegetable juice. Low-sodium or reduced-sodium tomatosauce and tomato paste. Low-sodium or reduced-sodium canned vegetables. Grains Whole-grain or whole-wheat bread. Whole-grain or whole-wheat pasta. Brown rice. Orpah Cobb. Bulgur. Whole-grain and low-sodium cereals. Pita bread.Low-fat, low-sodium crackers. Whole-wheat flour tortillas. Meats and other proteins Skinless chicken or [place]. Ground chicken or [place]. Pork with fat trimmed off. Fish and seafood. Egg whites. Dried beans, peas, or lentils. Unsalted nuts, nut butters, and seeds. Unsalted canned beans. Lean cuts of beef with fat trimmed off. Low-sodium, lean precooked or cured meat, such as sausages or meatloaves. Dairy Low-fat (1%) or fat-free (skim) milk. Reduced-fat, low-fat, or fat-free cheeses. Nonfat, low-sodium ricotta or cottage cheese. Low-fat or nonfatyogurt. Low-fat, low-sodium cheese. Fats and oils Soft margarine without trans fats. Vegetable oil. Reduced-fat, low-fat, or light mayonnaise and salad dressings (reduced-sodium). Canola, safflower, olive, avocado, soybean, andsunflower oils. Avocado. Seasonings and condiments Herbs. Spices. Seasoning mixes without salt. Other foods Unsalted popcorn and pretzels. Fat-free sweets. The items listed above may not be a complete list of foods and beverages you can eat. Contact a dietitian for more information. What foods should I avoid? Fruits Canned fruit in a light or heavy syrup. Fried fruit. Fruit in cream or buttersauce. Vegetables Creamed or fried vegetables. Vegetables in a cheese sauce. Regular canned vegetables (not low-sodium or reduced-sodium). Regular canned tomato sauce and paste (not low-sodium or reduced-sodium). Regular tomato and vegetable juice(not low-sodium or reduced-sodium). Rosita Fire. Olives. Grains Baked goods made with fat, such as croissants, muffins, or some breads. Drypasta or rice meal packs. Meats and other proteins Fatty cuts of meat. Ribs. Fried  meat. Tomasa Blase. Bologna, salami, and other precooked or cured meats, such as sausages or meat loaves. Fat from the back of a pig (fatback). Bratwurst. Salted nuts and seeds. Canned beans with added salt. Canned orsmoked fish. Whole eggs or egg yolks. Chicken or [place] with skin. Dairy Whole or 2% milk, cream, and half-and-half. Whole or full-fat cream cheese. Whole-fat or sweetened yogurt. Full-fat cheese. Nondairy creamers. Whippedtoppings. Processed cheese and cheese spreads. Fats and oils Butter. Stick margarine. Lard. Shortening. Ghee. Bacon fat. Tropical oils, suchas coconut, palm kernel, or palm oil. Seasonings and condiments Onion salt, garlic salt, seasoned salt, table salt, and sea salt. Worcestershire sauce. Tartar sauce. Barbecue sauce. Teriyaki sauce. Soy sauce, including reduced-sodium. Steak sauce. Canned and packaged gravies. Fish sauce. Oyster sauce. Cocktail sauce. Store-bought horseradish. Ketchup. Mustard. Meat flavorings and tenderizers. Bouillon cubes. Hot sauces. Pre-made or packaged marinades. Pre-made or packaged taco seasonings. Relishes. Regular saladdressings. Other foods Salted popcorn and pretzels. The items listed above may not be a complete list of foods and beverages you should avoid. Contact a dietitian for more information. Where to find more information National Heart, Lung, and Blood Institute: PopSteam.is American Heart Association: www.heart.org Academy of Nutrition and Dietetics: www.eatright.org National Kidney Foundation: www.kidney.org Summary The DASH eating plan is a healthy eating plan that has been shown to reduce high blood pressure (hypertension). It may also reduce your risk for type 2 diabetes, heart disease, and  stroke. When on the DASH eating plan, aim to eat more fresh fruits and vegetables, whole grains, lean proteins, low-fat dairy, and heart-healthy fats. With the DASH eating plan, you should limit salt (sodium) intake to 2,300 mg a day. If  you have hypertension, you may need to reduce your sodium intake to 1,500 mg a day. Work with your health care provider or dietitian to adjust your eating plan to your individual calorie needs. This information is not intended to replace advice given to you by your health care provider. Make sure you discuss any questions you have with your healthcare provider. Document Revised: 07/24/2019 Document Reviewed: 07/24/2019 Elsevier Patient Education  2022 Elsevier Inc.  Carvedilol Tablets What is this medication? CARVEDILOL (KAR ve dil ol) treats high blood pressure and heart failure. It may also be used to prevent further damage after a heart attack. It works by lowering your blood pressure and heart rate, making it easier for your heart to pump blood to the rest of your body. It belongs to a group of medicationscalled beta blockers. This medicine may be used for other purposes; ask your health care provider orpharmacist if you have questions. COMMON BRAND NAME(S): Coreg What should I tell my care team before I take this medication? They need to know if you have any of these conditions: Circulation problems Diabetes History of heart attack or heart disease Liver disease Lung or breathing disease, like asthma Pheochromocytoma Slow or irregular heartbeat Thyroid disease An unusual or allergic reaction to carvedilol, other beta blockers, medications, foods, dyes, or preservatives Pregnant or trying to get pregnant Breast-feeding How should I use this medication? Take this medication by mouth. Take it as directed on the prescription label at the same time every day. Take it with food. Keep taking it unless your careteam tells you to stop. Talk to your care team about the use of this medication in children. Specialcare may be needed. Overdosage: If you think you have taken too much of this medicine contact apoison control center or emergency room at once. NOTE: This medicine is only for you. Do  not share this medicine with others. What if I miss a dose? If you miss a dose, take it as soon as you can. If it is almost time for yournext dose, take only that dose. Do not take double or extra doses. What may interact with this medication? This medication may interact with the following: Certain medications for blood pressure, heart disease, irregular heartbeat Certain medications for depression, like fluoxetine or paroxetine Certain medications for diabetes, like glipizide or glyburide Cimetidine Clonidine Cyclosporine Digoxin MAOIs like Carbex, Eldepryl, Marplan, Nardil, and Parnate Reserpine Rifampin This list may not describe all possible interactions. Give your health care provider a list of all the medicines, herbs, non-prescription drugs, or dietary supplements you use. Also tell them if you smoke, drink alcohol, or use illegaldrugs. Some items may interact with your medicine. What should I watch for while using this medication? Visit your care team for regular checks on your progress. Check your blood pressure as directed. Ask your care team what your blood pressure should be.Also, find out when you should contact them. Do not treat yourself for coughs, colds, or pain while you are using this medication without asking your care team for advice. Some medications mayincrease your blood pressure. You may get drowsy or dizzy. Do not drive, use machinery, or do anything that needs mental alertness until you know how this medication affects you. Do not stand  up or sit up quickly, especially if you are an older patient. This reduces the risk of dizzy or fainting spells. Alcohol may interfere with theeffect of this medication. Avoid alcoholic drinks. This medication may increase blood sugar. Ask your care team if changes in dietor medications are needed if you have diabetes. If you are going to need surgery or another procedure, tell your care team thatyou are using this medication. What  side effects may I notice from receiving this medication? Side effects that you should report to your care team as soon as possible: Allergic reactions-skin rash, itching, hives, swelling of the face, lips, tongue, or throat Heart failure-shortness of breath, swelling of the ankles, feet, or hands, sudden weight gain, unusual weakness or fatigue Low blood pressure-dizziness, feeling faint or lightheaded, blurry vision Raynaud's-cool, numb, or painful fingers or toes that may change color from pale, to blue, to red Slow heartbeat-dizziness, feeling faint or lightheaded, confusion, trouble breathing, unusual weakness or fatigue Worsening mood, feelings of depression Side effects that usually do not require medical attention (report to your careteam if they continue or are bothersome): Change in sex drive or performance Diarrhea Dizziness Fatigue Headache This list may not describe all possible side effects. Call your doctor for medical advice about side effects. You may report side effects to FDA at1-800-FDA-1088. Where should I keep my medication? Keep out of the reach of children and pets. Store at room temperature between 20 and 25 degrees C (68 and 77 degrees F). Protect from moisture. Keep the container tightly closed. Throw away any unusedmedication after the expiration date. NOTE: This sheet is a summary. It may not cover all possible information. If you have questions about this medicine, talk to your doctor, pharmacist, orhealth care provider.  2022 Elsevier/Gold Standard (2020-09-22 11:31:41)

## 2021-02-20 NOTE — Assessment & Plan Note (Signed)
Uncontrolled, asymtomatic BP Readings from Last 3 Encounters:  02/20/21 (!) 148/98  12/28/20 (!) 153/102  11/15/20 (!) 142/98  Spironolactone 100mg  BID prescribed by Dr. Valsartan 320mg  daily prescribed by previous pcp. Admits she is not compliant with DASH diet nor daily exercise. S/p tubal ligation   Advised about importance of DASH diet and medication compliance Schedule f/up with Dr. Talmage Nap Repeat BMP, TSH and CBC Add coreg 3.125mg  BID F/up in 63months Maintain valsartan dose

## 2021-04-25 ENCOUNTER — Ambulatory Visit: Payer: Commercial Managed Care - PPO | Admitting: Nurse Practitioner

## 2021-05-02 ENCOUNTER — Ambulatory Visit: Payer: Commercial Managed Care - PPO | Admitting: Nurse Practitioner

## 2021-05-04 ENCOUNTER — Emergency Department (HOSPITAL_COMMUNITY)
Admission: EM | Admit: 2021-05-04 | Discharge: 2021-05-04 | Disposition: A | Discharge status: Home / Self Care | Payer: Commercial Managed Care - PPO | Attending: Emergency Medicine | Admitting: Emergency Medicine

## 2021-05-04 DIAGNOSIS — J029 Acute pharyngitis, unspecified: Secondary | ICD-10-CM | POA: Diagnosis present

## 2021-05-04 DIAGNOSIS — Z20822 Contact with and (suspected) exposure to covid-19: Secondary | ICD-10-CM | POA: Insufficient documentation

## 2021-05-04 DIAGNOSIS — J020 Streptococcal pharyngitis: Secondary | ICD-10-CM | POA: Diagnosis not present

## 2021-05-04 DIAGNOSIS — I10 Essential (primary) hypertension: Secondary | ICD-10-CM | POA: Diagnosis not present

## 2021-05-04 DIAGNOSIS — Z79899 Other long term (current) drug therapy: Secondary | ICD-10-CM | POA: Diagnosis not present

## 2021-05-04 DIAGNOSIS — R590 Localized enlarged lymph nodes: Secondary | ICD-10-CM | POA: Insufficient documentation

## 2021-05-04 LAB — RESP PANEL BY RT-PCR (FLU A&B, COVID) ARPGX2
Influenza A by PCR: NEGATIVE
Influenza B by PCR: NEGATIVE
SARS Coronavirus 2 by RT PCR: NEGATIVE

## 2021-05-04 LAB — GROUP A STREP BY PCR: Group A Strep by PCR: DETECTED — AB

## 2021-05-04 MED ORDER — PENICILLIN G BENZATHINE & PROC 1200000 UNIT/2ML IM SUSP
1.2000 10*6.[IU] | Freq: Once | INTRAMUSCULAR | Status: AC
  Administered 2021-05-04: 1.2 10*6.[IU] via INTRAMUSCULAR
  Filled 2021-05-04: qty 2

## 2021-05-04 NOTE — ED Triage Notes (Signed)
Pt here from home with c/o sore throat since tuesday , hurts to swallow

## 2021-05-04 NOTE — ED Provider Notes (Signed)
[facility] EMERGENCY DEPARTMENT Provider Note   CSN: [id number] Arrival date & time: 05/04/21  5631     History No chief complaint on file.   Krista [name] is a 40 y.o. female.  40 year old female with complaint of sore throat x2 days, no known sick contacts.  Denies fevers, chills, cough, congestion or body aches.  No other complaints or concerns today.      Past Medical History:  Diagnosis Date   Abnormal Pap smear 2010   Colpo & LEEP; LAST PAP 09/2011   Adrenal gland disorder (HCC) 2002   Aldosteronism (HCC) 2012   BV (bacterial vaginosis) 2008   CIN I (cervical intraepithelial neoplasia I) 2010   Dermatitis 2007   H/O bacterial infection    H/O candidiasis    H/O thalassemia    H/O varicella    Hx: UTI (urinary tract infection) 2010   Hyperaldosteronism (HCC)    Hypertension    Hypokalemia 2007   IP Sept 2012   Infection    UTI X 1   Migraine    Migraine headache    OTC   Monilial vaginitis 2008   Postpartum hypertension 07/06/10   Pregnancy induced hypertension 2011   Primary aldosteronism (HCC)    SAB (spontaneous abortion) x 3, TAB x 2 09/19/2012   Scoliosis    Sickle cell trait (HCC)    Thalassemia minor    CHRONIC   Yeast infection    Yeast vaginitis 2010    Patient Active Problem List   Diagnosis Date Noted   Hypertensive urgency 02/12/2016   Malignant hypertension due to primary aldosteronism (HCC) 08/09/2013   Fever 08/09/2013   Anemia 05/08/2013   Infection due to acinetobacter baumannii 05/08/2013   PIH (pregnancy induced hypertension), antepartum 05/05/2013   Nausea and vomiting in pregnancy 10/24/2012   H/O cesarean section x 2 09/19/2012   H/O LEEP (loop electrosurgical excision procedure) of cervix complicating pregnancy 09/19/2012   H/O bilateral breast reduction surgery 09/19/2012   Sickle cell trait (HCC) 09/19/2012   Thalassemia minor 09/19/2012   Leukocytosis 12/04/2011   Hyperaldosteronism (HCC) 12/03/2011    Hypokalemia 12/03/2011   HTN (hypertension) 12/03/2011    Past Surgical History:  Procedure Laterality Date   BREAST REDUCTION SURGERY  Jan 2006   CESAREAN SECTION  2005 2011 2014   CESAREAN SECTION WITH BILATERAL TUBAL LIGATION Bilateral 05/05/2013   Procedure: REPEAT CESAREAN SECTION WITH BILATERAL TUBAL LIGATION,;  Surgeon: Hal Morales, MD;  Location: [facility];  Service: Obstetrics;  Laterality: Bilateral;   DILATION AND CURETTAGE OF UTERUS  2003   DILITATION & CURRETTAGE/HYSTROSCOPY WITH NOVASURE ABLATION N/A 03/22/2017   Procedure: DILATATION & CURETTAGE/HYSTEROSCOPY WITH NOVASURE ABLATION;  Surgeon: Olivia Mackie, MD;  Location: [facility];  Service: Gynecology;  Laterality: N/A;   TUBAL LIGATION     WISDOM TOOTH EXTRACTION       OB History     Gravida  7   Para  3   Term  3   Preterm  0   AB  4   Living  4      SAB  3   IAB  1   Ectopic  0   Multiple  1   Live Births  4           Family History  Problem Relation Age of Onset   Thalassemia Mother    Arthritis Mother    Early death Mother 39   Other Mother  GUILLON-BERRE SX; BLOOD CLOT   Hypertension Father    Heart attack Father    Heart disease Father        MI   Early death Father    Kidney disease Father    Breast cancer Maternal Aunt    Breast cancer Maternal Grandmother    Cancer Maternal Grandfather        Bone    Social History   Tobacco Use   Smoking status: Never   Smokeless tobacco: Never  Vaping Use   Vaping Use: Never used  Substance Use Topics   Alcohol use: Yes    Comment: OCC   Drug use: No    Home Medications Prior to Admission medications   Medication Sig Start Date End Date Taking? Authorizing Provider  carvedilol (COREG) 3.125 MG tablet Take 1 tablet (3.125 mg total) by mouth 2 (two) times daily with a meal. 02/20/21   Nche, Bonna Gains, NP  potassium chloride (KLOR-CON) 20 MEQ packet 1 packet with food 09/15/19   [provider]   spironolactone (ALDACTONE) 100 MG tablet Take 100 mg by mouth in the morning and at bedtime.    [provider]  valsartan (DIOVAN) 320 MG tablet Take 1 tablet (320 mg total) by mouth daily. 02/20/21   Nche, Bonna Gains, NP    Allergies    Fluconazole and Sulfa antibiotics  Review of Systems   Review of Systems  Constitutional:  Negative for chills, diaphoresis and fever.  HENT:  Positive for sore throat. Negative for congestion, ear pain, trouble swallowing and voice change.   Eyes:  Negative for redness.  Respiratory:  Negative for cough.   Skin:  Negative for rash and wound.  Allergic/Immunologic: Negative for immunocompromised state.  Neurological:  Positive for headaches.  Hematological:  Negative for adenopathy.  All other systems reviewed and are negative.  Physical Exam Updated Vital Signs BP (!) 176/108   Pulse (!) 115   Temp 99.2 F (37.3 C) (Oral)   Resp 16   SpO2 98%   Physical Exam Vitals and nursing note reviewed.  Constitutional:      General: She is not in acute distress.    Appearance: She is well-developed. She is not diaphoretic.  HENT:     Head: Normocephalic and atraumatic.     Nose: Nose normal.     Mouth/Throat:     Mouth: Mucous membranes are moist.     Tonsils: Tonsillar exudate present. No tonsillar abscesses. 1+ on the right. 1+ on the left.  Eyes:     Conjunctiva/sclera: Conjunctivae normal.  Cardiovascular:     Rate and Rhythm: Normal rate and regular rhythm.     Pulses: Normal pulses.     Heart sounds: Normal heart sounds.  Pulmonary:     Effort: Pulmonary effort is normal.     Breath sounds: Normal breath sounds.  Lymphadenopathy:     Cervical: Cervical adenopathy present.  Skin:    General: Skin is warm and dry.     Findings: No erythema or rash.  Neurological:     Mental Status: She is alert and oriented to person, place, and time.  Psychiatric:        Behavior: Behavior normal.    ED Results / Procedures /  Treatments   Labs (all labs ordered are listed, but only abnormal results are displayed) Labs Reviewed  GROUP A STREP BY PCR - Abnormal; Notable for the following components:      Result Value  Group A Strep by PCR DETECTED (*)    All other components within normal limits  RESP PANEL BY RT-PCR (FLU A&B, COVID) [id number]    EKG None  Radiology No results found.  Procedures Procedures   Medications Ordered in ED Medications  penicillin g procaine-penicillin g benzathine (BICILLIN-CR) injection 600000-600000 units (has no administration in time range)    ED Course  I have reviewed the triage vital signs and the nursing notes.  Pertinent labs & imaging results that were available during my care of the patient were reviewed by me and considered in my medical decision making (see chart for details).  Clinical Course as of 05/04/21 0919  Thu May 04, 2021  5635 40 year old female with sore throat x2 days.  Found to have enlarged tonsils with mild exudate and tender anterior cervical of adenopathy.  Offered oral antibiotics versus IM dose in the ED, patient would prefer IM dose.  Recommend Motrin Tylenol at home, return for worsening or concerning symptoms. [LM]    Clinical Course User Index [LM] Alden Hipp   MDM Rules/Calculators/A&P                           Final Clinical Impression(s) / ED Diagnoses Final diagnoses:  Strep throat    Rx / DC Orders ED Discharge Orders     None        Jeannie Fend, PA-C 05/04/21 0919    Terald Sleeper, MD 05/04/21 1119

## 2021-05-04 NOTE — Discharge Instructions (Addendum)
Home to rest.  Recommend hydrating fluids such as water or Gatorade.  Motrin Tylenol as needed as discussed.  Gargle warm salt water, drink warm tea with honey.  Replace your toothbrush Friday night.

## 2021-09-21 ENCOUNTER — Other Ambulatory Visit: Payer: Self-pay | Admitting: Family Medicine

## 2021-09-21 ENCOUNTER — Other Ambulatory Visit: Payer: Self-pay

## 2021-09-21 ENCOUNTER — Ambulatory Visit: Payer: Self-pay

## 2021-09-21 DIAGNOSIS — M79671 Pain in right foot: Secondary | ICD-10-CM

## 2021-09-21 DIAGNOSIS — M25571 Pain in right ankle and joints of right foot: Secondary | ICD-10-CM

## 2021-10-18 ENCOUNTER — Other Ambulatory Visit: Payer: Self-pay | Admitting: Nurse Practitioner

## 2021-10-19 NOTE — Telephone Encounter (Signed)
Was filled by previous provider, can we refill?

## 2021-10-24 NOTE — Telephone Encounter (Signed)
Patient notified and verbalized understanding. Appointment scheduled. 

## 2021-12-11 ENCOUNTER — Ambulatory Visit: Payer: Self-pay | Admitting: Nurse Practitioner

## 2021-12-11 ENCOUNTER — Telehealth: Payer: Self-pay | Admitting: Nurse Practitioner

## 2021-12-11 NOTE — Telephone Encounter (Signed)
Sent no show letter 12/11/21.  ?

## 2021-12-12 NOTE — Telephone Encounter (Signed)
1st no show, fee waived ?

## 2023-12-02 ENCOUNTER — Telehealth: Payer: Self-pay | Admitting: Nurse Practitioner

## 2023-12-02 NOTE — Telephone Encounter (Signed)
 Lvmtcb if needing appt.
